# Patient Record
Sex: Male | Born: 1960 | Race: White | Hispanic: No | Marital: Married | Smoking: Former smoker
Health system: Southern US, Community
[De-identification: ages and names within clinical notes are randomized; demographics above are authoritative.]

## PROBLEM LIST (undated history)

## (undated) ENCOUNTER — Inpatient Hospital Stay: Payer: Self-pay

## (undated) DIAGNOSIS — M5030 Other cervical disc degeneration, unspecified cervical region: Secondary | ICD-10-CM

## (undated) DIAGNOSIS — I639 Cerebral infarction, unspecified: Secondary | ICD-10-CM

## (undated) DIAGNOSIS — Z860100 Personal history of colon polyps, unspecified: Secondary | ICD-10-CM

## (undated) DIAGNOSIS — Z87442 Personal history of urinary calculi: Secondary | ICD-10-CM

## (undated) DIAGNOSIS — E785 Hyperlipidemia, unspecified: Secondary | ICD-10-CM

## (undated) DIAGNOSIS — H9192 Unspecified hearing loss, left ear: Secondary | ICD-10-CM

## (undated) DIAGNOSIS — G4730 Sleep apnea, unspecified: Secondary | ICD-10-CM

## (undated) DIAGNOSIS — G589 Mononeuropathy, unspecified: Secondary | ICD-10-CM

## (undated) DIAGNOSIS — G5600 Carpal tunnel syndrome, unspecified upper limb: Secondary | ICD-10-CM

## (undated) DIAGNOSIS — K219 Gastro-esophageal reflux disease without esophagitis: Secondary | ICD-10-CM

## (undated) DIAGNOSIS — H547 Unspecified visual loss: Secondary | ICD-10-CM

## (undated) DIAGNOSIS — C61 Malignant neoplasm of prostate: Secondary | ICD-10-CM

## (undated) DIAGNOSIS — K429 Umbilical hernia without obstruction or gangrene: Secondary | ICD-10-CM

## (undated) DIAGNOSIS — G459 Transient cerebral ischemic attack, unspecified: Secondary | ICD-10-CM

## (undated) DIAGNOSIS — T7840XA Allergy, unspecified, initial encounter: Secondary | ICD-10-CM

## (undated) DIAGNOSIS — R0600 Dyspnea, unspecified: Secondary | ICD-10-CM

## (undated) DIAGNOSIS — R609 Edema, unspecified: Secondary | ICD-10-CM

## (undated) DIAGNOSIS — C801 Malignant (primary) neoplasm, unspecified: Secondary | ICD-10-CM

## (undated) HISTORY — DX: Gastro-esophageal reflux disease without esophagitis: K21.9

## (undated) HISTORY — DX: Malignant (primary) neoplasm, unspecified: C80.1

## (undated) HISTORY — DX: Hyperlipidemia, unspecified: E78.5

## (undated) HISTORY — DX: Carpal tunnel syndrome, unspecified upper limb: G56.00

## (undated) HISTORY — DX: Personal history of colonic polyps: Z86.010

## (undated) HISTORY — PX: OTHER SURGICAL HISTORY: SHX169

## (undated) HISTORY — DX: Other cervical disc degeneration, unspecified cervical region: M50.30

## (undated) HISTORY — DX: Malignant neoplasm of prostate: C61

## (undated) HISTORY — DX: Mononeuropathy, unspecified: G58.9

## (undated) HISTORY — DX: Allergy, unspecified, initial encounter: T78.40XA

## (undated) HISTORY — DX: Unspecified visual loss: H54.7

## (undated) HISTORY — PX: TONSILLECTOMY: SUR1361

## (undated) HISTORY — DX: Transient cerebral ischemic attack, unspecified: G45.9

## (undated) HISTORY — DX: Personal history of colon polyps, unspecified: Z86.0100

## (undated) HISTORY — DX: Sleep apnea, unspecified: G47.30

## (undated) HISTORY — PX: APPENDECTOMY: SHX54

## (undated) HISTORY — DX: Edema, unspecified: R60.9

---

## 2004-07-12 ENCOUNTER — Ambulatory Visit: Payer: Self-pay | Admitting: General Practice

## 2008-04-01 ENCOUNTER — Ambulatory Visit: Payer: Self-pay | Admitting: Family Medicine

## 2009-11-09 ENCOUNTER — Emergency Department: Payer: Self-pay | Admitting: Emergency Medicine

## 2011-07-25 DIAGNOSIS — G459 Transient cerebral ischemic attack, unspecified: Secondary | ICD-10-CM

## 2011-07-25 HISTORY — DX: Transient cerebral ischemic attack, unspecified: G45.9

## 2011-08-14 ENCOUNTER — Ambulatory Visit: Payer: Self-pay

## 2013-05-26 HISTORY — PX: COLONOSCOPY: SHX174

## 2014-02-17 ENCOUNTER — Other Ambulatory Visit: Payer: Self-pay | Admitting: Ophthalmology

## 2014-02-17 LAB — SEDIMENTATION RATE: Erythrocyte Sed Rate: 1 mm/hr (ref 0–20)

## 2014-04-07 ENCOUNTER — Ambulatory Visit: Payer: Self-pay | Admitting: Gastroenterology

## 2014-09-18 LAB — SURGICAL PATHOLOGY

## 2015-02-13 ENCOUNTER — Encounter: Payer: Self-pay | Admitting: Family Medicine

## 2015-02-13 ENCOUNTER — Ambulatory Visit (INDEPENDENT_AMBULATORY_CARE_PROVIDER_SITE_OTHER): Payer: BC Managed Care – PPO | Admitting: Family Medicine

## 2015-02-13 VITALS — BP 127/87 | HR 69 | Temp 98.6°F | Ht 69.5 in | Wt 280.0 lb

## 2015-02-13 DIAGNOSIS — T7840XA Allergy, unspecified, initial encounter: Secondary | ICD-10-CM | POA: Insufficient documentation

## 2015-02-13 DIAGNOSIS — G4730 Sleep apnea, unspecified: Secondary | ICD-10-CM | POA: Diagnosis not present

## 2015-02-13 DIAGNOSIS — R0981 Nasal congestion: Secondary | ICD-10-CM | POA: Insufficient documentation

## 2015-02-13 DIAGNOSIS — E669 Obesity, unspecified: Secondary | ICD-10-CM

## 2015-02-13 DIAGNOSIS — E785 Hyperlipidemia, unspecified: Secondary | ICD-10-CM

## 2015-02-13 DIAGNOSIS — Z125 Encounter for screening for malignant neoplasm of prostate: Secondary | ICD-10-CM | POA: Diagnosis not present

## 2015-02-13 DIAGNOSIS — K219 Gastro-esophageal reflux disease without esophagitis: Secondary | ICD-10-CM | POA: Diagnosis not present

## 2015-02-13 DIAGNOSIS — K429 Umbilical hernia without obstruction or gangrene: Secondary | ICD-10-CM | POA: Diagnosis not present

## 2015-02-13 MED ORDER — SIMVASTATIN 40 MG PO TABS: 40.0000 mg | ORAL_TABLET | Freq: Every day | ORAL | Status: DC

## 2015-02-13 MED ORDER — TRAZODONE HCL 50 MG PO TABS: 100.0000 mg | ORAL_TABLET | Freq: Every day | ORAL | Status: DC

## 2015-02-13 NOTE — Assessment & Plan Note (Signed)
Referral to General surgery made today. Non-strangulated.

## 2015-02-13 NOTE — Assessment & Plan Note (Addendum)
Labs checked today and lipemic, but has been off meds for 20 days.  LFTs normal. Refill of his medicine given today. Follow up in 6 months on cholesterol, 1 month for PE

## 2015-02-13 NOTE — Progress Notes (Signed)
BP 127/87 mmHg  Pulse 69  Temp(Src) 98.6 F (37 C)  Ht 5' 9.5" (1.765 m)  Wt 280 lb (127.007 kg)  BMI 40.77 kg/m2  SpO2 98%   Subjective:    Patient ID: Curtis Ran., male    DOB: 11-16-1960, 54 y.o.   MRN: [id number]  HPI: Curtis Bray. is a 54 y.o. male who presents today to establish care and for concerns about the following.   Chief Complaint  Patient presents with  . sleep issues    Patient states that he is able to fall asleep, but does not sleep soundly so he takes trazodone nightly  . Hyperlipidemia    Patient needs a refill on his cholesterol medication, he would like 90day supplies for each prescription.   HYPERLIPIDEMIA- Has known about it since TIA in 2013 Hyperlipidemia status: excellent compliance Satisfied with current treatment?  yes Side effects:  no Medication compliance: excellent compliance Supplements: fish oil Aspirin:  yes Chest pain:  no Coronary artery disease:  no Family history CAD:  no Family history early CAD:  no   Has had some bad problems with his sleep in the past. Has sleep apnea- uses the cpap. Seems to do OK, but doesn't seem to help with the sleep. Has chronic congestion in his nose that always seems to be a problem with his breathing. Not sure if he wants to see ENT or not. [facility]   INSOMNIA Duration: chronic Satisfied with sleep quality: yes Difficulty falling asleep: no Difficulty staying asleep: no Waking a few hours after sleep onset: no Early morning awakenings: no Daytime hypersomnolence: no Wakes feeling refreshed: yes Good sleep hygiene: yes Apnea: no Snoring: no Depressed/anxious mood: no Recent stress: no Restless legs/nocturnal leg cramps: no Chronic pain/arthritis: no History of sleep study: yes Treatments attempted: well controlled on trazodone  Relevant past medical, surgical, family and social history reviewed and updated as indicated. Interim medical history since our last  visit reviewed. Allergies and medications reviewed and updated.  Review of Systems  HENT: Positive for congestion, postnasal drip, rhinorrhea and sinus pressure. Negative for dental problem, drooling, ear discharge, ear pain, facial swelling, hearing loss, mouth sores, nosebleeds, sneezing, sore throat, tinnitus, trouble swallowing and voice change.   Respiratory: Positive for apnea and shortness of breath. Negative for choking, chest tightness, wheezing and stridor.   Cardiovascular: Negative.   Gastrointestinal: Negative.   Musculoskeletal: Negative.   Psychiatric/Behavioral: Negative.     Per HPI unless specifically indicated above     Objective:    BP 127/87 mmHg  Pulse 69  Temp(Src) 98.6 F (37 C)  Ht 5' 9.5" (1.765 m)  Wt 280 lb (127.007 kg)  BMI 40.77 kg/m2  SpO2 98%  Wt Readings from Last 3 Encounters:  02/13/15 280 lb (127.007 kg)    Physical Exam  Constitutional: He is oriented to person, place, and time. He appears well-developed and well-nourished. No distress.  HENT:  Head: Normocephalic and atraumatic.  Right Ear: Hearing normal.  Left Ear: Hearing normal.  Nose: Mucosal edema, sinus tenderness and septal deviation present. No rhinorrhea, nose lacerations, nasal deformity or nasal septal hematoma. No epistaxis.  No foreign bodies.  Mouth/Throat: Oropharynx is clear and moist. No oropharyngeal exudate.  Eyes: Conjunctivae and lids are normal. Right eye exhibits no discharge. Left eye exhibits no discharge. No scleral icterus.  Cardiovascular: Normal rate, regular rhythm, normal heart sounds and intact distal pulses.  Exam reveals no gallop and  no friction rub.   No murmur heard. Pulmonary/Chest: Effort normal and breath sounds normal. No respiratory distress. He has no wheezes. He has no rales. He exhibits no tenderness.  Abdominal: Soft. Bowel sounds are normal. He exhibits no distension and no mass. There is no tenderness. There is no rebound and no guarding.   Umbilical hernia   Musculoskeletal: Normal range of motion.  Neurological: He is alert and oriented to person, place, and time.  Skin: Skin is warm, dry and intact. No rash noted. No erythema. No pallor.  Psychiatric: He has a normal mood and affect. His speech is normal and behavior is normal. Judgment and thought content normal. Cognition and memory are normal.    Results for orders placed or performed in visit on 04/07/14  Surgical pathology  Result Value Ref Range   SURGICAL PATHOLOGY      Surgical Procedure CASE: [id number] PATIENT: Clide Dales Surgical Pathology Report     SPECIMEN SUBMITTED: A. Rectal polyp, cbx B. Hepatic flexure polyp, cbx  CLINICAL HISTORY: None provided  PRE-OPERATIVE DIAGNOSIS: Screening  POST-OPERATIVE DIAGNOSIS: None provided     DIAGNOSIS: A RECTAL POLYP; COLD BIOPSY: - HYPERPLASTIC POLYP. - NEGATIVE FOR DYSPLASIA AND MALIGNANCY.  B. HEPATIC FLEXURE POLYP; COLD BIOPSY: - POLYPOID FRAGMENT OF MILDLY INFLAMED COLONIC MUCOSA WITH PROMINENT LYMPHOID AGGREGATE. - NEGATIVE FOR DYSPLASIA AND MALIGNANCY.   GROSS DESCRIPTION:  A. Labeled: Rectal polyp C biopsy Tissue Fragment(s): 1 Measurement: 0.3 cm Comment: Tan  Entirely submitted in cassette(s): 1  B. Labeled: Hepatic flexure polyp C biopsy Tissue Fragment(s): 2 Measurement: 0.1-0.4 cm Comment: Tan  Entirely submitted in cassette(s): 1     Final Diagnosis performed by Quay Burow, MD.  Electronically signed 04/12/2014 6:45:22AM    The  electronic signature indicates that the named Attending Pathologist has evaluated the specimen  Technical component performed at [facility], [address] [place], [place] [address] Lab: [phone] Dir: Darrick Penna. Evette Doffing, MD  Professional component performed at [facility], [facility], [place], [place], [place] [address] Lab: [phone] Dir: Dellia Nims. Reuel Derby, MD        Assessment & Plan:    Problem List Items Addressed This Visit      Digestive   GERD (gastroesophageal reflux disease)    Well controlled on current regimen. Continue current regimen.       Relevant Medications   esomeprazole (NEXIUM) 40 MG capsule     Other   Sleep apnea    Under good control on Bipap. On trazodone for sleep with good control. Will get 3 month supply when records come in. Continue to monitor.       Hyperlipidemia - Primary    Labs checked today and lipemic, but has been off meds for 20 days.  LFTs normal. Refill of his medicine given today. Follow up in 6 months on cholesterol, 1 month for PE      Relevant Medications   simvastatin (ZOCOR) 40 MG tablet   Other Relevant Orders   CBC With Differential/Platelet   Comprehensive metabolic panel   LP+ALT+AST Piccolo, Waived   Umbilical hernia without obstruction and without gangrene    Referral to General surgery made today. Non-strangulated.       Relevant Orders   Ambulatory referral to General Surgery   CBC With Differential/Platelet   Chronic nasal congestion    Advised him against using afrin due to rebound. Will refer to ENT for eval for chronic congestion and severely deviated septum due to trauma.  Relevant Orders   Ambulatory referral to ENT   CBC With Differential/Platelet    Other Visit Diagnoses    Obesity        Work on diet and exercise. Continue to monitor.     Relevant Orders    CBC With Differential/Platelet    TSH    Screening for prostate cancer        PSA checked today    Relevant Orders    CBC With Differential/Platelet    PSA        Follow up plan: Return in about 4 weeks (around 03/13/2015) for Physical. Needs Records release.

## 2015-02-13 NOTE — Assessment & Plan Note (Addendum)
Well controlled on current regimen. Continue current regimen.

## 2015-02-13 NOTE — Assessment & Plan Note (Signed)
Advised him against using afrin due to rebound. Will refer to ENT for eval for chronic congestion and severely deviated septum due to trauma.

## 2015-02-13 NOTE — Assessment & Plan Note (Signed)
Under good control on Bipap. On trazodone for sleep with good control. Will get 3 month supply when records come in. Continue to monitor.

## 2015-02-13 NOTE — Patient Instructions (Signed)

## 2015-02-14 LAB — LP+ALT+AST PICCOLO, WAIVED
ALT (SGPT) PICCOLO, WAIVED: 42 U/L (ref 10–47)
AST (SGOT) PICCOLO, WAIVED: 25 U/L (ref 11–38)

## 2015-02-14 LAB — CBC WITH DIFFERENTIAL/PLATELET
HEMATOCRIT: 47.7 % (ref 37.5–51.0)
HEMOGLOBIN: 16.5 g/dL (ref 12.6–17.7)
LYMPHS ABS: 2.5 10*3/uL (ref 0.7–3.1)
Lymphs: 41 %
MCH: 30.7 pg (ref 26.6–33.0)
MCHC: 34.6 g/dL (ref 31.5–35.7)
MCV: 89 fL (ref 79–97)
MID (Absolute): 0.3 10*3/uL (ref 0.1–1.6)
MID: 6 %
NEUTROS PCT: 54 %
Neutrophils Absolute: 3.4 10*3/uL (ref 1.4–7.0)
Platelets: 183 10*3/uL (ref 150–379)
RBC: 5.38 x10E6/uL (ref 4.14–5.80)
RDW: 13.6 % (ref 12.3–15.4)
WBC: 6.2 10*3/uL (ref 3.4–10.8)

## 2015-02-15 ENCOUNTER — Encounter: Payer: Self-pay | Admitting: Family Medicine

## 2015-02-15 LAB — SPECIMEN STATUS REPORT

## 2015-02-15 LAB — LIPID PANEL W/O CHOL/HDL RATIO
CHOLESTEROL TOTAL: 171 mg/dL (ref 100–199)
HDL: 31 mg/dL — AB (ref >39)
LDL Calculated: 67 mg/dL (ref 0–99)
TRIGLYCERIDES: 363 mg/dL — AB (ref 0–149)

## 2015-02-16 ENCOUNTER — Ambulatory Visit: Payer: Self-pay | Admitting: Family Medicine

## 2015-02-21 ENCOUNTER — Encounter: Payer: Self-pay | Admitting: *Deleted

## 2015-02-27 LAB — COMPREHENSIVE METABOLIC PANEL
A/G RATIO: 1.9 (ref 1.1–2.5)
ALK PHOS: 65 IU/L (ref 39–117)
ALT: 34 IU/L (ref 0–44)
AST: 18 IU/L (ref 0–40)
Albumin: 4.3 g/dL (ref 3.5–5.5)
BILIRUBIN TOTAL: 0.6 mg/dL (ref 0.0–1.2)
BUN/Creatinine Ratio: 9 (ref 9–20)
BUN: 10 mg/dL (ref 6–24)
CHLORIDE: 102 mmol/L (ref 97–108)
CO2: 25 mmol/L (ref 18–29)
Calcium: 9.3 mg/dL (ref 8.7–10.2)
Creatinine, Ser: 1.07 mg/dL (ref 0.76–1.27)
GFR calc Af Amer: 91 mL/min/{1.73_m2} (ref >59)
GFR calc non Af Amer: 79 mL/min/{1.73_m2} (ref >59)
GLOBULIN, TOTAL: 2.3 g/dL (ref 1.5–4.5)
Glucose: 94 mg/dL (ref 65–99)
POTASSIUM: 4.6 mmol/L (ref 3.5–5.2)
SODIUM: 142 mmol/L (ref 134–144)
Total Protein: 6.6 g/dL (ref 6.0–8.5)

## 2015-02-27 LAB — TSH: TSH: 2.67 u[IU]/mL (ref 0.450–4.500)

## 2015-02-27 LAB — PSA: PROSTATE SPECIFIC AG, SERUM: 3.6 ng/mL (ref 0.0–4.0)

## 2015-03-07 ENCOUNTER — Ambulatory Visit (INDEPENDENT_AMBULATORY_CARE_PROVIDER_SITE_OTHER): Payer: BC Managed Care – PPO | Admitting: General Surgery

## 2015-03-07 ENCOUNTER — Encounter: Payer: Self-pay | Admitting: General Surgery

## 2015-03-07 VITALS — BP 120/70 | HR 78 | Resp 12 | Ht 70.0 in | Wt 283.0 lb

## 2015-03-07 DIAGNOSIS — K429 Umbilical hernia without obstruction or gangrene: Secondary | ICD-10-CM

## 2015-03-07 DIAGNOSIS — R202 Paresthesia of skin: Secondary | ICD-10-CM | POA: Insufficient documentation

## 2015-03-07 DIAGNOSIS — R200 Anesthesia of skin: Secondary | ICD-10-CM | POA: Insufficient documentation

## 2015-03-07 NOTE — Progress Notes (Signed)
Patient ID: Curtis Ran., male   DOB: 1960-08-14, 54 y.o.   MRN: [id number]  Chief Complaint  Patient presents with  . Hernia    HPI Curtis Bray. is a 54 y.o. male. Here today for evaluation of a possible umbilical hernia. He states he noticed this about nine months ago. No pain or change in size. He states he has had some numbness in his right thumb.    HPI  Past Medical History  Diagnosis Date  . TIA (transient ischemic attack) 07/2011  . Vision impairment     Blindness Right Eye  . Edema   . Sleep apnea     Bi-pap  . Hyperlipidemia   . GERD (gastroesophageal reflux disease)   . Allergy   . Personal history of colonic polyps   . Sleep apnea     Past Surgical History  Procedure Laterality Date  . Appendectomy    . Colonoscopy  2015    Found 2 beign polyps, repeat 10 years  . Carpel tunnel Bilateral   . Tonsillectomy      Family History  Problem Relation Age of Onset  . Hearing loss Father   . Hypertension Father   . Asthma Son   . Cancer Maternal Grandmother     Colon    Social History Social History  Substance Use Topics  . Smoking status: Former Smoker -- 18 years    Quit date: 05/27/1995  . Smokeless tobacco: Never Used  . Alcohol Use: 0.0 oz/week    0 Standard drinks or equivalent per week     Comment: Rare    No Known Allergies  Current Outpatient Prescriptions  Medication Sig Dispense Refill  . aspirin 81 MG tablet Take 81 mg by mouth daily.    Marland Kitchen BIOTIN PO Take 1 tablet by mouth daily.    . Coenzyme Q10 (CO Q 10) 10 MG CAPS Take by mouth.    . esomeprazole (NEXIUM) 40 MG capsule Take by mouth.    . Misc. Devices (NASAL SPRAY BOTTLE) MISC Place 1 spray into the nose daily.    . simvastatin (ZOCOR) 40 MG tablet Take 1 tablet (40 mg total) by mouth daily at 6 PM. 90 tablet 1  . traZODone (DESYREL) 50 MG tablet Take 2 tablets (100 mg total) by mouth at bedtime. 60 tablet 1   No current facility-administered medications for this visit.     Review of Systems Review of Systems  Constitutional: Negative.   Respiratory: Negative.   Cardiovascular: Negative.   Gastrointestinal: Negative.     Blood pressure 120/70, pulse 78, resp. rate 12, height 5\' 10"  (1.778 m), weight 283 lb (128.368 kg).  Physical Exam Physical Exam  Constitutional: He is oriented to person, place, and time. He appears well-developed and well-nourished.  Cardiovascular: Normal rate, regular rhythm and normal heart sounds.   Pulmonary/Chest: Effort normal and breath sounds normal.  Abdominal: Soft. Normal appearance and bowel sounds are normal. There is no hepatomegaly. There is no tenderness. A hernia (small umbilical hernia 6 mm defect  reducible) is present.    Musculoskeletal:       Arms: Neurological: He is alert and oriented to person, place, and time. He has normal strength. No cranial nerve deficit.  Upper extremity motor strength testing 5/5+ bilaterally.  Reported numbness on the dorsum of the right thumb from the tip of the gland to the MCP joint. No signs of trauma. Mildly tender with palpation.  Skin: Skin is warm  and dry.    Data Reviewed None  Assessment    Umbilical hernia, minimally symptomatic.  Numbness involving the right dorsal thumb.    Plan    Options for management of the hernia were reviewed: 1) observation versus 2) elective repair.  Patient is minimally symptomatic at the present time. Risks and benefits of elective repair were discussed. Unlikely candidate for mesh reinforcement. She was redeveloped pain at the umbilical hernia site or notices significant change in size early follow-up was recommended.   Patient to return as needed.   PCP:  Thelma Comp 03/07/2015, 9:29 PM

## 2015-03-07 NOTE — Patient Instructions (Signed)
Patient to return as needed. 

## 2015-03-09 ENCOUNTER — Telehealth: Payer: Self-pay | Admitting: *Deleted

## 2015-03-09 NOTE — Telephone Encounter (Signed)
-----   Message from Robert Bellow, MD sent at 03/09/2015  7:46 AM EDT ----- Please notifythe patient I spoke w/ ortho about his thumb numbness. If he wears his watch on that wrist, he should move it to the other side and see if the numbness resolves. If he does not wear his watch on the right wrist, he should be seen by Dr.Chris Tamala Julian or Earnestine Leys at [facility] for assessment.

## 2015-03-09 NOTE — Telephone Encounter (Signed)
Notified patient as instructed, patient pleased. Discussed follow-up appointments as needed, patient agrees. He states he will follow up with [facility].

## 2015-03-14 ENCOUNTER — Ambulatory Visit
Admission: RE | Admit: 2015-03-14 | Discharge: 2015-03-14 | Disposition: A | Discharge status: Home / Self Care | Payer: BC Managed Care – PPO | Source: Ambulatory Visit | Attending: Family Medicine | Admitting: Family Medicine

## 2015-03-14 ENCOUNTER — Telehealth: Payer: Self-pay | Admitting: Family Medicine

## 2015-03-14 ENCOUNTER — Encounter: Payer: Self-pay | Admitting: Family Medicine

## 2015-03-14 ENCOUNTER — Ambulatory Visit (INDEPENDENT_AMBULATORY_CARE_PROVIDER_SITE_OTHER): Payer: BC Managed Care – PPO | Admitting: Family Medicine

## 2015-03-14 VITALS — BP 127/81 | HR 75 | Temp 98.2°F | Ht 70.2 in | Wt 281.0 lb

## 2015-03-14 DIAGNOSIS — R202 Paresthesia of skin: Secondary | ICD-10-CM

## 2015-03-14 DIAGNOSIS — Z0000 Encounter for general adult medical examination without abnormal findings: Secondary | ICD-10-CM | POA: Diagnosis not present

## 2015-03-14 DIAGNOSIS — M25552 Pain in left hip: Secondary | ICD-10-CM

## 2015-03-14 DIAGNOSIS — R0981 Nasal congestion: Secondary | ICD-10-CM | POA: Diagnosis not present

## 2015-03-14 DIAGNOSIS — R200 Anesthesia of skin: Secondary | ICD-10-CM

## 2015-03-14 MED ORDER — PREDNISONE 10 MG PO TABS: ORAL_TABLET | ORAL | Status: DC

## 2015-03-14 NOTE — Patient Instructions (Signed)

## 2015-03-14 NOTE — Assessment & Plan Note (Signed)
Encouraged patient to make that appointment with ENT.

## 2015-03-14 NOTE — Telephone Encounter (Signed)
Called with negative x-ray results. Will try prednisone. Sent to his pharmacy. Will check in in 1 week to see if better, if not better, consider neurology referral. Will hold on orthopedic referral at this time.

## 2015-03-14 NOTE — Progress Notes (Signed)
BP 127/81 mmHg  Pulse 75  Temp(Src) 98.2 F (36.8 C)  Ht 5' 10.2" (1.783 m)  Wt 281 lb (127.461 kg)  BMI 40.09 kg/m2  SpO2 97%   Subjective:    Patient ID: Curtis Ran., male    DOB: [birth year], 54 y.o.   MRN: [id number]  HPI: Curtis Duford. is a 54 y.o. male presenting on 03/14/2015 for comprehensive medical examination. Current medical complaints include: thumb numbness  Thumb numbness- general surgeon gave him a referral to ortho, but hasn't seen them yet Duration: 5 weeks Involved hand: right Mechanism of injury: Lost nail back in March, but no trauma since then Location: lateral dorsum Onset: sudden Severity: severe  Quality: numb Frequency: constant Radiation: yes up into his palm a little bit Aggravating factors: nothing Alleviating factors: nothing Treatments attempted: nothing Relief with NSAIDs?: No NSAIDs Taken Weakness: no Numbness: yes Redness: no Swelling:no Bruising: no Fevers: no  Interim Problems from his last visit: see above  Depression Screen done today and results listed below:  Depression screen [facility] 2/9 03/14/2015  Decreased Interest 0  Down, Depressed, Hopeless 1  PHQ - 2 Score 1  Altered sleeping 0  Tired, decreased energy 0  Change in appetite 1  Feeling bad or failure about yourself  0  Trouble concentrating 0  Moving slowly or fidgety/restless 0  Suicidal thoughts 0  PHQ-9 Score 2  Difficult doing work/chores Not difficult at all    The patient does not have a history of falls. I did not complete a risk assessment for falls. A plan of care for falls was not documented.  Past Medical History:  Past Medical History  Diagnosis Date  . TIA (transient ischemic attack) 07/2011  . Vision impairment     Blindness Right Eye  . Edema   . Sleep apnea     Bi-pap  . Hyperlipidemia   . GERD (gastroesophageal reflux disease)   . Allergy   . Personal history of colonic polyps   . Sleep apnea     Surgical History:  Past  Surgical History  Procedure Laterality Date  . Appendectomy    . Colonoscopy  2015    Found 2 beign polyps, repeat 10 years  . Carpel tunnel Bilateral   . Tonsillectomy      Medications:  Current Outpatient Prescriptions on File Prior to Visit  Medication Sig  . aspirin 81 MG tablet Take 81 mg by mouth daily.  Marland Kitchen BIOTIN PO Take 1 tablet by mouth daily.  . Coenzyme Q10 (CO Q 10) 10 MG CAPS Take by mouth.  . esomeprazole (NEXIUM) 40 MG capsule Take by mouth.  . Misc. Devices (NASAL SPRAY BOTTLE) MISC Place 1 spray into the nose daily.  . simvastatin (ZOCOR) 40 MG tablet Take 1 tablet (40 mg total) by mouth daily at 6 PM.  . traZODone (DESYREL) 50 MG tablet Take 2 tablets (100 mg total) by mouth at bedtime.   No current facility-administered medications on file prior to visit.    Allergies:  No Known Allergies  Social History:  Social History   Social History  . Marital Status: Married    Spouse Name: N/A  . Number of Children: N/A  . Years of Education: N/A   Occupational History  . Not on file.   Social History Main Topics  . Smoking status: Former Smoker -- 18 years    Quit date: 05/27/1995  . Smokeless tobacco: Never Used  . Alcohol Use: 0.0 oz/week  0 Standard drinks or equivalent per week     Comment: Rare  . Drug Use: No  . Sexual Activity: Yes   Other Topics Concern  . Not on file   Social History Narrative   History  Smoking status  . Former Smoker -- 18 years  . Quit date: 05/27/1995  Smokeless tobacco  . Never Used   History  Alcohol Use  . 0.0 oz/week  . 0 Standard drinks or equivalent per week    Comment: Rare    Family History:  Family History  Problem Relation Age of Onset  . Hearing loss Father   . Hypertension Father   . Asthma Son   . Cancer Maternal Grandmother     Colon    Past medical history, surgical history, medications, allergies, family history and social history reviewed with patient today and changes made to  appropriate areas of the chart.   Review of Systems  Constitutional: Negative.   HENT: Positive for congestion. Negative for ear discharge, ear pain, hearing loss, nosebleeds, sore throat and tinnitus.   Eyes: Negative.   Respiratory: Negative.  Negative for stridor.        DOE   Cardiovascular: Negative.   Gastrointestinal: Negative.   Genitourinary: Negative.   Musculoskeletal: Negative for myalgias, back pain, joint pain, falls and neck pain.       L hip feels tight with decreased ROM   Skin: Negative.   Neurological: Positive for tingling. Negative for tremors, sensory change, speech change, focal weakness, seizures, loss of consciousness and headaches.  Endo/Heme/Allergies: Negative.   Psychiatric/Behavioral: Negative.     All other ROS negative except what is listed above and in the HPI.      Objective:    BP 127/81 mmHg  Pulse 75  Temp(Src) 98.2 F (36.8 C)  Ht 5' 10.2" (1.783 m)  Wt 281 lb (127.461 kg)  BMI 40.09 kg/m2  SpO2 97%  Wt Readings from Last 3 Encounters:  03/14/15 281 lb (127.461 kg)  03/07/15 283 lb (128.368 kg)  02/13/15 280 lb (127.007 kg)    Physical Exam  Constitutional: He is oriented to person, place, and time. He appears well-developed and well-nourished. No distress.  HENT:  Head: Normocephalic and atraumatic.  Right Ear: Hearing and external ear normal.  Left Ear: Hearing and external ear normal.  Nose: Mucosal edema and septal deviation present. No rhinorrhea, nose lacerations, sinus tenderness, nasal deformity or nasal septal hematoma. No epistaxis.  No foreign bodies. Right sinus exhibits no maxillary sinus tenderness and no frontal sinus tenderness. Left sinus exhibits no maxillary sinus tenderness and no frontal sinus tenderness.  Mouth/Throat: Oropharynx is clear and moist. No oropharyngeal exudate.  Eyes: Conjunctivae, EOM and lids are normal. Pupils are equal, round, and reactive to light. Right eye exhibits no discharge. Left eye  exhibits no discharge. No scleral icterus.  Neck: Normal range of motion. Neck supple. No JVD present. No tracheal deviation present. No thyromegaly present.  Cardiovascular: Normal rate, regular rhythm, normal heart sounds and intact distal pulses.  Exam reveals no gallop and no friction rub.   No murmur heard. Pulmonary/Chest: Effort normal and breath sounds normal. No stridor. No respiratory distress.  Abdominal: Soft. Bowel sounds are normal. He exhibits no distension and no mass. There is no tenderness. There is no rebound and no guarding.  Genitourinary: Rectum normal, prostate normal and penis normal. No penile tenderness.  Musculoskeletal: Normal range of motion. He exhibits no edema or tenderness.  No tenderness  to palpation of the thumb  Lymphadenopathy:    He has no cervical adenopathy.  Neurological: He is alert and oriented to person, place, and time. He has normal reflexes. He displays normal reflexes. No cranial nerve deficit. He exhibits normal muscle tone. Coordination normal.  Negative Phalens, negative tinel's, decreased sensation to lateral half of R thumb  Skin: Skin is warm, dry and intact. No rash noted. He is not diaphoretic. No erythema. No pallor.  Psychiatric: He has a normal mood and affect. His speech is normal and behavior is normal. Judgment and thought content normal. Cognition and memory are normal.  Nursing note and vitals reviewed.   Results for orders placed or performed in visit on 02/13/15  Comprehensive metabolic panel  Result Value Ref Range   Glucose 94 65 - 99 mg/dL   BUN 10 6 - 24 mg/dL   Creatinine, Ser 1.07 0.76 - 1.27 mg/dL   GFR calc non Af Amer 79 >59 mL/min/1.73   GFR calc Af Amer 91 >59 mL/min/1.73   BUN/Creatinine Ratio 9 9 - 20   Sodium 142 134 - 144 mmol/L   Potassium 4.6 3.5 - 5.2 mmol/L   Chloride 102 97 - 108 mmol/L   CO2 25 18 - 29 mmol/L   Calcium 9.3 8.7 - 10.2 mg/dL   Total Protein 6.6 6.0 - 8.5 g/dL   Albumin 4.3 3.5 - 5.5  g/dL   Globulin, Total 2.3 1.5 - 4.5 g/dL   Albumin/Globulin Ratio 1.9 1.1 - 2.5   Bilirubin Total 0.6 0.0 - 1.2 mg/dL   Alkaline Phosphatase 65 39 - 117 IU/L   AST 18 0 - 40 IU/L   ALT 34 0 - 44 IU/L  PSA  Result Value Ref Range   Prostate Specific Ag, Serum 3.6 0.0 - 4.0 ng/mL  TSH  Result Value Ref Range   TSH 2.670 0.450 - 4.500 uIU/mL  LP+ALT+AST Piccolo, Waived  Result Value Ref Range   ALT (SGPT) Piccolo, Waived 42 10 - 47 U/L   AST (SGOT) Piccolo, Waived 25 11 - 38 U/L   Cholesterol Piccolo, Waived CANCELED    HDL Chol Piccolo, Waived CANCELED    Triglycerides Piccolo,Waived CANCELED   CBC With Differential/Platelet  Result Value Ref Range   WBC 6.2 3.4 - 10.8 x10E3/uL   RBC 5.38 4.14 - 5.80 x10E6/uL   Hemoglobin 16.5 12.6 - 17.7 g/dL   Hematocrit 47.7 37.5 - 51.0 %   MCV 89 79 - 97 fL   MCH 30.7 26.6 - 33.0 pg   MCHC 34.6 31.5 - 35.7 g/dL   RDW 13.6 12.3 - 15.4 %   Platelets 183 150 - 379 x10E3/uL   Neutrophils 54 %   Lymphs 41 %   MID 6 %   Neutrophils Absolute 3.4 1.4 - 7.0 x10E3/uL   Lymphocytes Absolute 2.5 0.7 - 3.1 x10E3/uL   MID (Absolute) 0.3 0.1 - 1.6 X10E3/uL  Specimen status report  Result Value Ref Range   specimen status report Comment   Lipid Panel w/o Chol/HDL Ratio  Result Value Ref Range   Cholesterol, Total 171 100 - 199 mg/dL   Triglycerides 363 (H) 0 - 149 mg/dL   HDL 31 (L) >39 mg/dL   LDL Calculated 67 0 - 99 mg/dL      Assessment & Plan:   Problem List Items Addressed This Visit    None    Visit Diagnoses    Paresthesia of thumb of right hand    -  Primary    Relevant Orders    DG Finger Thumb Right       LABORATORY TESTING:  Health maintenance labs ordered today as discussed above.   The natural history of prostate cancer and ongoing controversy regarding screening and potential treatment outcomes of prostate cancer has been discussed with the patient. The meaning of a false positive PSA and a false negative PSA has been  discussed. He indicates understanding of the limitations of this screening test and wishes to proceed with screening PSA testing. It was done last visit and was normal.    IMMUNIZATIONS:   - Tdap: Tetanus vaccination status reviewed: Refused. - Influenza: Up to date  SCREENING: - Colonoscopy: Up to date  Discussed with patient purpose of the colonoscopy is to detect colon cancer at curable precancerous or early stages   PATIENT COUNSELING:    Sexuality: Discussed sexually transmitted diseases, partner selection, use of condoms, avoidance of unintended pregnancy  and contraceptive alternatives.   Advised to avoid cigarette smoking.  I discussed with the patient that most people either abstain from alcohol or drink within safe limits (<=14/week and <=4 drinks/occasion for males, <=7/weeks and <= 3 drinks/occasion for females) and that the risk for alcohol disorders and other health effects rises proportionally with the number of drinks per week and how often a drinker exceeds daily limits.  Discussed cessation/primary prevention of drug use and availability of treatment for abuse.   Diet: Encouraged to adjust caloric intake to maintain  or achieve ideal body weight, to reduce intake of dietary saturated fat and total fat, to limit sodium intake by avoiding high sodium foods and not adding table salt, and to maintain adequate dietary potassium and calcium preferably from fresh fruits, vegetables, and low-fat dairy products.    stressed the importance of regular exercise  Injury prevention: Discussed safety belts, safety helmets, smoke detector, smoking near bedding or upholstery.   Dental health: Discussed importance of regular tooth brushing, flossing, and dental visits.   Follow up plan: NEXT PREVENTATIVE PHYSICAL DUE IN 1 YEAR. Return in about 6 months (around 09/12/2015) for Cholesterol follow up.

## 2015-03-14 NOTE — Assessment & Plan Note (Signed)
Normal exam, but given trauma that happened in March, will obtain x-ray of the thumb. Await results. Has referral to ortho, but has not called them yet. Offered steroid for anti-inflammatory, but wants to hold off at this time.

## 2015-03-22 ENCOUNTER — Telehealth: Payer: Self-pay | Admitting: Family Medicine

## 2015-03-22 DIAGNOSIS — R200 Anesthesia of skin: Secondary | ICD-10-CM

## 2015-03-22 DIAGNOSIS — R202 Paresthesia of skin: Principal | ICD-10-CM

## 2015-03-22 NOTE — Telephone Encounter (Signed)
Calling to check in on his thumb numbness. LMOM for him to call back.

## 2015-03-26 NOTE — Telephone Encounter (Signed)
Called to ask how his thumb is doing. Still very numb, possibly a little worse. No change with the prednisone. He would like to look into this further. Would like to see neurology. Referral to neurology generated today. Would like to stay local.

## 2015-05-11 ENCOUNTER — Other Ambulatory Visit: Payer: Self-pay | Admitting: Family Medicine

## 2015-05-11 ENCOUNTER — Telehealth: Payer: Self-pay | Admitting: Family Medicine

## 2015-05-11 MED ORDER — SIMVASTATIN 40 MG PO TABS: 40.0000 mg | ORAL_TABLET | Freq: Every day | ORAL | Status: DC

## 2015-05-11 MED ORDER — ESOMEPRAZOLE MAGNESIUM 40 MG PO CPDR: 40.0000 mg | DELAYED_RELEASE_CAPSULE | Freq: Every day | ORAL | Status: DC

## 2015-05-11 MED ORDER — ASPIRIN 81 MG PO TABS: 81.0000 mg | ORAL_TABLET | Freq: Every day | ORAL | Status: DC

## 2015-05-11 MED ORDER — TRAZODONE HCL 50 MG PO TABS: 100.0000 mg | ORAL_TABLET | Freq: Every day | ORAL | Status: DC

## 2015-05-11 NOTE — Telephone Encounter (Signed)
Pt called needs 90 day supplies of the following:  Aspirin Trazadone Simvastatin Nexium  Needs new RX's printed as he is changing pharmacies. Please call when RX's are ready for pick up @ [phone]. Thanks.

## 2015-05-11 NOTE — Telephone Encounter (Signed)
90 day supply refill for medication. Patient states he needs rx's printed because he's changing pharmacies.    Last Visit: 03/14/2015 Next Appt: 09/13/2015

## 2015-05-16 ENCOUNTER — Telehealth: Payer: Self-pay

## 2015-05-16 ENCOUNTER — Other Ambulatory Visit: Payer: Self-pay | Admitting: Family Medicine

## 2015-05-16 MED ORDER — ESOMEPRAZOLE MAGNESIUM 20 MG PO CPDR: 20.0000 mg | DELAYED_RELEASE_CAPSULE | Freq: Every day | ORAL | Status: DC

## 2015-05-16 NOTE — Telephone Encounter (Signed)
Patient called and said he needs a prior authroization for his Nexium. I explained to call his pharmacy and the pharmacy will fax me over a form. The form has information that I use to complete the PA. He asked how long it took and I explained that it depended on [employer], but 2-4 days usually. He asked "so through Christmas?" and I explained "yes".   I asked Dr. Sanda Klein if we could give him samples of Nexium to get him through the weekend and determination of the PA. She agreed and we gave him 4 packs of Nexium samples. I put the samples in a bag and wrote Curtis Bray a little note. This is what the note read.   --- "Dear Mr. Curtis Bray,  Here are 4packs of Nexium samples to get you through till the determination of your Prior Authorization. Take 1 capsule daily.  To begin the process, call your pharmacy and have them fax [place] a Prior Authorization Request form. It has information included I use for a better chance of approval.  If you have any questions or concerns, please don't hesitate to call.   Thank you and Leida Lauth, CMA" ---  Patient notified via phone to come pick up his samples and to take 1 daily.

## 2015-05-16 NOTE — Telephone Encounter (Signed)
Sept 20, 2016 SGPT reviewed; Rx approved

## 2015-06-07 NOTE — Telephone Encounter (Signed)
Disregard

## 2015-06-08 DIAGNOSIS — IMO0002 Reserved for concepts with insufficient information to code with codable children: Secondary | ICD-10-CM | POA: Insufficient documentation

## 2015-08-13 ENCOUNTER — Other Ambulatory Visit: Payer: Self-pay | Admitting: Family Medicine

## 2015-08-15 ENCOUNTER — Other Ambulatory Visit: Payer: Self-pay

## 2015-08-15 MED ORDER — TRAZODONE HCL 50 MG PO TABS: 100.0000 mg | ORAL_TABLET | Freq: Every day | ORAL | Status: DC

## 2015-08-15 MED ORDER — ESOMEPRAZOLE MAGNESIUM 40 MG PO CPDR: 40.0000 mg | DELAYED_RELEASE_CAPSULE | Freq: Every day | ORAL | Status: DC

## 2015-09-13 ENCOUNTER — Other Ambulatory Visit: Payer: Self-pay | Admitting: Family Medicine

## 2015-09-13 ENCOUNTER — Ambulatory Visit (INDEPENDENT_AMBULATORY_CARE_PROVIDER_SITE_OTHER): Payer: BC Managed Care – PPO | Admitting: Family Medicine

## 2015-09-13 ENCOUNTER — Encounter: Payer: Self-pay | Admitting: Family Medicine

## 2015-09-13 VITALS — BP 126/77 | HR 65 | Temp 98.0°F | Ht 69.7 in | Wt 282.0 lb

## 2015-09-13 DIAGNOSIS — E785 Hyperlipidemia, unspecified: Secondary | ICD-10-CM | POA: Diagnosis not present

## 2015-09-13 DIAGNOSIS — R0981 Nasal congestion: Secondary | ICD-10-CM | POA: Diagnosis not present

## 2015-09-13 DIAGNOSIS — K219 Gastro-esophageal reflux disease without esophagitis: Secondary | ICD-10-CM | POA: Diagnosis not present

## 2015-09-13 DIAGNOSIS — G4700 Insomnia, unspecified: Secondary | ICD-10-CM | POA: Diagnosis not present

## 2015-09-13 LAB — LP+ALT+AST PICCOLO, WAIVED
ALT (SGPT) Piccolo, Waived: 38 U/L (ref 10–47)
AST (SGOT) Piccolo, Waived: 26 U/L (ref 11–38)

## 2015-09-13 MED ORDER — SIMVASTATIN 40 MG PO TABS: 40.0000 mg | ORAL_TABLET | Freq: Every day | ORAL | Status: DC

## 2015-09-13 MED ORDER — TRAZODONE HCL 50 MG PO TABS: 100.0000 mg | ORAL_TABLET | Freq: Every day | ORAL | Status: DC

## 2015-09-13 MED ORDER — RABEPRAZOLE SODIUM 20 MG PO TBEC: 20.0000 mg | DELAYED_RELEASE_TABLET | Freq: Every day | ORAL | Status: DC

## 2015-09-13 NOTE — Assessment & Plan Note (Signed)
Well controlled on his trazodone. Continue current regimen. Advised him of drug holidays. Call with any concerns.

## 2015-09-13 NOTE — Assessment & Plan Note (Signed)
Will restart aciphex. Call if not helping or causing problems.

## 2015-09-13 NOTE — Assessment & Plan Note (Signed)
Lipemic on exam today. Will send out. Await results.

## 2015-09-13 NOTE — Assessment & Plan Note (Signed)
Did not see ENT. Last referral >70months old. New referral put in today.

## 2015-09-13 NOTE — Progress Notes (Signed)
BP 126/77 mmHg  Pulse 65  Temp(Src) 98 F (36.7 C)  Ht 5' 9.7" (1.77 m)  Wt 282 lb (127.914 kg)  BMI 40.83 kg/m2  SpO2 98%   Subjective:    Patient ID: Curtis Ran., male    DOB: [birth year], 55 y.o.   MRN: [id number]  HPI: Curtis Achee. is a 55 y.o. male  Chief Complaint  Patient presents with  . Hyperlipidemia  . Gastroesophageal Reflux   HYPERLIPIDEMIA Hyperlipidemia status: good control Satisfied with current treatment?  yes Side effects:  no Medication compliance: excellent compliance Past cholesterol meds: none Supplements: none Aspirin:  yes Chest pain:  yes Coronary artery disease:  no Family history CAD:  yes Family history early CAD:  no  GERD- couldn't afford the nexium GERD control status: uncontrolled  Satisfied with current treatment? no Heartburn frequency: Constant Medication side effects: no  Medication compliance: can't afford it Previous GERD medications: nexium, omeprazole,aciphex Antacid use frequency:  daily Duration: chronic Nature: burning Location: substernal Heartburn duration: chronic and all the time Alleviatiating factors:  medicine Aggravating factors: food Dysphagia: no Odynophagia:  no Hematemesis: no Blood in stool: no EGD: no  INSOMNIA Duration: chronic Satisfied with sleep quality: yes Difficulty falling asleep: no Difficulty staying asleep: no Waking a few hours after sleep onset: no Early morning awakenings: no Daytime hypersomnolence: no Wakes feeling refreshed: yes Good sleep hygiene: yes Apnea: yes Snoring: yes Depressed/anxious mood: no Recent stress: no Restless legs/nocturnal leg cramps: no Chronic pain/arthritis: no History of sleep study: yes Treatments attempted: does really well with trazodone, would like to continue it   Still having a lot of issues with congestion. Did not see ENT. Needs new referral.  Relevant past medical, surgical, family and social history reviewed and updated as  indicated. Interim medical history since our last visit reviewed. Allergies and medications reviewed and updated.  Review of Systems  Constitutional: Negative.   HENT: Positive for congestion. Negative for dental problem.   Respiratory: Negative.   Cardiovascular: Negative.   Gastrointestinal: Negative.   Psychiatric/Behavioral: Negative.     Per HPI unless specifically indicated above     Objective:    BP 126/77 mmHg  Pulse 65  Temp(Src) 98 F (36.7 C)  Ht 5' 9.7" (1.77 m)  Wt 282 lb (127.914 kg)  BMI 40.83 kg/m2  SpO2 98%  Wt Readings from Last 3 Encounters:  09/13/15 282 lb (127.914 kg)  03/14/15 281 lb (127.461 kg)  03/07/15 283 lb (128.368 kg)    Physical Exam  Constitutional: He is oriented to person, place, and time. He appears well-developed and well-nourished. No distress.  HENT:  Head: Normocephalic and atraumatic.  Right Ear: Hearing normal.  Left Ear: Hearing normal.  Nose: Nose normal.  Eyes: Conjunctivae and lids are normal. Right eye exhibits no discharge. Left eye exhibits no discharge. No scleral icterus.  Cardiovascular: Normal rate, regular rhythm, normal heart sounds and intact distal pulses.  Exam reveals no gallop and no friction rub.   No murmur heard. Pulmonary/Chest: Effort normal and breath sounds normal. No respiratory distress. He has no wheezes. He has no rales. He exhibits no tenderness.  Musculoskeletal: Normal range of motion.  Neurological: He is alert and oriented to person, place, and time.  Skin: Skin is warm, dry and intact. No rash noted. No erythema. No pallor.  Psychiatric: He has a normal mood and affect. His speech is normal and behavior is normal. Judgment and thought content normal. Cognition and memory  are normal.  Nursing note and vitals reviewed.   Results for orders placed or performed in visit on 02/13/15  Comprehensive metabolic panel  Result Value Ref Range   Glucose 94 65 - 99 mg/dL   BUN 10 6 - 24 mg/dL    Creatinine, Ser 1.07 0.76 - 1.27 mg/dL   GFR calc non Af Amer 79 >59 mL/min/1.73   GFR calc Af Amer 91 >59 mL/min/1.73   BUN/Creatinine Ratio 9 9 - 20   Sodium 142 134 - 144 mmol/L   Potassium 4.6 3.5 - 5.2 mmol/L   Chloride 102 97 - 108 mmol/L   CO2 25 18 - 29 mmol/L   Calcium 9.3 8.7 - 10.2 mg/dL   Total Protein 6.6 6.0 - 8.5 g/dL   Albumin 4.3 3.5 - 5.5 g/dL   Globulin, Total 2.3 1.5 - 4.5 g/dL   Albumin/Globulin Ratio 1.9 1.1 - 2.5   Bilirubin Total 0.6 0.0 - 1.2 mg/dL   Alkaline Phosphatase 65 39 - 117 IU/L   AST 18 0 - 40 IU/L   ALT 34 0 - 44 IU/L  PSA  Result Value Ref Range   Prostate Specific Ag, Serum 3.6 0.0 - 4.0 ng/mL  TSH  Result Value Ref Range   TSH 2.670 0.450 - 4.500 uIU/mL  LP+ALT+AST Piccolo, Waived  Result Value Ref Range   ALT (SGPT) Piccolo, Waived 42 10 - 47 U/L   AST (SGOT) Piccolo, Waived 25 11 - 38 U/L   Cholesterol Piccolo, Waived CANCELED    HDL Chol Piccolo, Waived CANCELED    Triglycerides Piccolo,Waived CANCELED   CBC With Differential/Platelet  Result Value Ref Range   WBC 6.2 3.4 - 10.8 x10E3/uL   RBC 5.38 4.14 - 5.80 x10E6/uL   Hemoglobin 16.5 12.6 - 17.7 g/dL   Hematocrit 47.7 37.5 - 51.0 %   MCV 89 79 - 97 fL   MCH 30.7 26.6 - 33.0 pg   MCHC 34.6 31.5 - 35.7 g/dL   RDW 13.6 12.3 - 15.4 %   Platelets 183 150 - 379 x10E3/uL   Neutrophils 54 %   Lymphs 41 %   MID 6 %   Neutrophils Absolute 3.4 1.4 - 7.0 x10E3/uL   Lymphocytes Absolute 2.5 0.7 - 3.1 x10E3/uL   MID (Absolute) 0.3 0.1 - 1.6 X10E3/uL  Specimen status report  Result Value Ref Range   specimen status report Comment   Lipid Panel w/o Chol/HDL Ratio  Result Value Ref Range   Cholesterol, Total 171 100 - 199 mg/dL   Triglycerides 363 (H) 0 - 149 mg/dL   HDL 31 (L) >39 mg/dL   LDL Calculated 67 0 - 99 mg/dL      Assessment & Plan:   Problem List Items Addressed This Visit      Digestive   GERD (gastroesophageal reflux disease)    Will restart aciphex. Call if not  helping or causing problems.       Relevant Medications   RABEprazole (ACIPHEX) 20 MG tablet     Other   Hyperlipidemia - Primary    Lipemic on exam today. Will send out. Await results.       Relevant Medications   simvastatin (ZOCOR) 40 MG tablet   Other Relevant Orders   Lipid Panel w/o Chol/HDL Ratio   Chronic nasal congestion    Did not see ENT. Last referral >8months old. New referral put in today.      Relevant Orders   Ambulatory referral to ENT  Insomnia    Well controlled on his trazodone. Continue current regimen. Advised him of drug holidays. Call with any concerns.           Follow up plan: Return in about 6 months (around 03/14/2016) for Physical.

## 2015-09-14 ENCOUNTER — Encounter: Payer: Self-pay | Admitting: Family Medicine

## 2015-09-14 LAB — LIPID PANEL W/O CHOL/HDL RATIO
Cholesterol, Total: 149 mg/dL (ref 100–199)
HDL: 31 mg/dL — AB (ref >39)
TRIGLYCERIDES: 466 mg/dL — AB (ref 0–149)

## 2015-12-10 ENCOUNTER — Other Ambulatory Visit: Payer: Self-pay | Admitting: Family Medicine

## 2015-12-11 ENCOUNTER — Ambulatory Visit (INDEPENDENT_AMBULATORY_CARE_PROVIDER_SITE_OTHER): Payer: BC Managed Care – PPO | Admitting: Unknown Physician Specialty

## 2015-12-11 ENCOUNTER — Encounter: Payer: Self-pay | Admitting: Unknown Physician Specialty

## 2015-12-11 VITALS — BP 128/69 | HR 79 | Temp 97.9°F | Ht 71.1 in | Wt 281.2 lb

## 2015-12-11 DIAGNOSIS — J0191 Acute recurrent sinusitis, unspecified: Secondary | ICD-10-CM | POA: Diagnosis not present

## 2015-12-11 DIAGNOSIS — H109 Unspecified conjunctivitis: Secondary | ICD-10-CM

## 2015-12-11 MED ORDER — ERYTHROMYCIN 5 MG/GM OP OINT: 1.0000 "application " | TOPICAL_OINTMENT | Freq: Three times a day (TID) | OPHTHALMIC | Status: DC

## 2015-12-11 MED ORDER — AMOXICILLIN 875 MG PO TABS: 875.0000 mg | ORAL_TABLET | Freq: Two times a day (BID) | ORAL | Status: DC

## 2015-12-11 NOTE — Progress Notes (Signed)
BP 128/69 mmHg  Pulse 79  Temp(Src) 97.9 F (36.6 C)  Ht 5' 11.1" (1.806 m)  Wt 281 lb 3.2 oz (127.551 kg)  BMI 39.11 kg/m2  SpO2 97%   Subjective:    Patient ID: Curtis Ran., male    DOB: [birth year], 55 y.o.   MRN: [id number]  HPI: Curtis Badder. is a 55 y.o. male  Chief Complaint  Patient presents with  . URI    pt states he has a lot of congestion, thick phlegm, and cough. states symptoms started Thursday night or Friday of last week   . Eye Problem    pt states he woke up with his right eye stuck together in the middle of the night    URI  This is a new problem. The current episode started in the past 7 days. The problem has been gradually worsening. There has been no fever. Associated symptoms include congestion, coughing, headaches and a sore throat. Pertinent negatives include no sinus pain. He has tried nothing for the symptoms.  Eye Problem  The right eye is affected. This is a new problem. The current episode started yesterday. The problem occurs constantly. The problem has been gradually worsening. There was no injury mechanism. The pain is mild. There is no known exposure to pink eye. He does not wear contacts. Associated symptoms include eye redness, photophobia and a recent URI. He has tried water for the symptoms. The treatment provided no relief.     Relevant past medical, surgical, family and social history reviewed and updated as indicated. Interim medical history since our last visit reviewed. Allergies and medications reviewed and updated.  Review of Systems  HENT: Positive for congestion and sore throat.   Eyes: Positive for photophobia and redness.  Respiratory: Positive for cough.   Neurological: Positive for headaches.    Per HPI unless specifically indicated above     Objective:    BP 128/69 mmHg  Pulse 79  Temp(Src) 97.9 F (36.6 C)  Ht 5' 11.1" (1.806 m)  Wt 281 lb 3.2 oz (127.551 kg)  BMI 39.11 kg/m2  SpO2 97%  Wt Readings  from Last 3 Encounters:  12/11/15 281 lb 3.2 oz (127.551 kg)  09/13/15 282 lb (127.914 kg)  03/14/15 281 lb (127.461 kg)    Physical Exam  Constitutional: He is oriented to person, place, and time. He appears well-developed and well-nourished. No distress.  HENT:  Head: Normocephalic and atraumatic.  Right Ear: Tympanic membrane and ear canal normal.  Left Ear: Tympanic membrane and ear canal normal.  Nose: Mucosal edema and sinus tenderness present. Right sinus exhibits maxillary sinus tenderness. Left sinus exhibits maxillary sinus tenderness.  Mouth/Throat: Mucous membranes are normal. Oropharyngeal exudate and posterior oropharyngeal edema present.  Eyes: Conjunctivae and lids are normal. Right eye exhibits no discharge. Left eye exhibits no discharge. No scleral icterus.  Cardiovascular: Normal rate and regular rhythm.   Pulmonary/Chest: Effort normal. No respiratory distress.  Abdominal: Normal appearance and bowel sounds are normal. He exhibits no distension. There is no splenomegaly or hepatomegaly. There is no tenderness.  Musculoskeletal: Normal range of motion.  Neurological: He is alert and oriented to person, place, and time.  Skin: Skin is intact. No rash noted. No pallor.  Psychiatric: He has a normal mood and affect. His behavior is normal. Judgment and thought content normal.    Results for orders placed or performed in visit on 09/13/15  [facility], Waived  Result Value Ref  Range   ALT (SGPT) [facility], Waived 38 10 - 47 U/L   AST (SGOT) [facility], Waived 26 11 - 38 U/L   Cholesterol [facility], Waived CANCELED    HDL Chol [facility], Waived CANCELED    Triglycerides [facility],Waived CANCELED   Lipid Panel w/o Chol/HDL Ratio  Result Value Ref Range   Cholesterol, Total 149 100 - 199 mg/dL   Triglycerides 466 (H) 0 - 149 mg/dL   HDL 31 (L) >39 mg/dL   VLDL Cholesterol Cal Comment 5 - 40 mg/dL   LDL Calculated Comment 0 - 99 mg/dL      Assessment & Plan:    Problem List Items Addressed This Visit    None    Visit Diagnoses    Acute recurrent sinusitis, unspecified location    -  Primary    Relevant Medications    amoxicillin (AMOXIL) 875 MG tablet    Conjunctivitis of right eye            Follow up plan: Return if symptoms worsen or fail to improve.

## 2016-02-19 ENCOUNTER — Other Ambulatory Visit: Payer: Self-pay | Admitting: Family Medicine

## 2016-03-18 ENCOUNTER — Other Ambulatory Visit: Payer: Self-pay | Admitting: Family Medicine

## 2016-03-18 ENCOUNTER — Encounter: Payer: Self-pay | Admitting: Family Medicine

## 2016-03-18 ENCOUNTER — Ambulatory Visit (INDEPENDENT_AMBULATORY_CARE_PROVIDER_SITE_OTHER): Payer: BC Managed Care – PPO | Admitting: Family Medicine

## 2016-03-18 VITALS — BP 130/79 | HR 75 | Temp 98.8°F | Ht 69.6 in | Wt 277.5 lb

## 2016-03-18 DIAGNOSIS — K219 Gastro-esophageal reflux disease without esophagitis: Secondary | ICD-10-CM | POA: Diagnosis not present

## 2016-03-18 DIAGNOSIS — G4733 Obstructive sleep apnea (adult) (pediatric): Secondary | ICD-10-CM | POA: Diagnosis not present

## 2016-03-18 DIAGNOSIS — Z0000 Encounter for general adult medical examination without abnormal findings: Secondary | ICD-10-CM

## 2016-03-18 DIAGNOSIS — R200 Anesthesia of skin: Secondary | ICD-10-CM

## 2016-03-18 DIAGNOSIS — M25511 Pain in right shoulder: Secondary | ICD-10-CM | POA: Diagnosis not present

## 2016-03-18 DIAGNOSIS — R0981 Nasal congestion: Secondary | ICD-10-CM | POA: Diagnosis not present

## 2016-03-18 DIAGNOSIS — Z23 Encounter for immunization: Secondary | ICD-10-CM | POA: Diagnosis not present

## 2016-03-18 DIAGNOSIS — Z125 Encounter for screening for malignant neoplasm of prostate: Secondary | ICD-10-CM

## 2016-03-18 DIAGNOSIS — F5101 Primary insomnia: Secondary | ICD-10-CM

## 2016-03-18 DIAGNOSIS — E782 Mixed hyperlipidemia: Secondary | ICD-10-CM

## 2016-03-18 DIAGNOSIS — R202 Paresthesia of skin: Secondary | ICD-10-CM | POA: Diagnosis not present

## 2016-03-18 DIAGNOSIS — T7840XD Allergy, unspecified, subsequent encounter: Secondary | ICD-10-CM | POA: Diagnosis not present

## 2016-03-18 DIAGNOSIS — Z1159 Encounter for screening for other viral diseases: Secondary | ICD-10-CM

## 2016-03-18 MED ORDER — MELOXICAM 15 MG PO TABS: 15.0000 mg | ORAL_TABLET | Freq: Every day | ORAL | 1 refills | Status: DC

## 2016-03-18 MED ORDER — RABEPRAZOLE SODIUM 20 MG PO TBEC: 20.0000 mg | DELAYED_RELEASE_TABLET | Freq: Every day | ORAL | 1 refills | Status: DC

## 2016-03-18 MED ORDER — SIMVASTATIN 40 MG PO TABS: 40.0000 mg | ORAL_TABLET | Freq: Every day | ORAL | 1 refills | Status: DC

## 2016-03-18 MED ORDER — ASPIRIN 81 MG PO TABS: 81.0000 mg | ORAL_TABLET | Freq: Every day | ORAL | 4 refills | Status: DC

## 2016-03-18 MED ORDER — TRAZODONE HCL 50 MG PO TABS: 100.0000 mg | ORAL_TABLET | Freq: Every day | ORAL | 1 refills | Status: DC

## 2016-03-18 NOTE — Assessment & Plan Note (Signed)
Stable. Doing better. Continue to monitor.

## 2016-03-18 NOTE — Assessment & Plan Note (Signed)
Stable. No concerns. Call with any concerns.

## 2016-03-18 NOTE — Assessment & Plan Note (Signed)
Stable. Doing well. Call with any concerns.

## 2016-03-18 NOTE — Patient Instructions (Addendum)
Health Maintenance, Male A healthy lifestyle and preventative care can promote health and wellness.  Maintain regular health, dental, and eye exams.  Eat a healthy diet. Foods like vegetables, fruits, whole grains, low-fat dairy products, and lean protein foods contain the nutrients you need and are low in calories. Decrease your intake of foods high in solid fats, added sugars, and salt. Get information about a proper diet from your health care provider, if necessary.  Regular physical exercise is one of the most important things you can do for your health. Most adults should get at least 150 minutes of moderate-intensity exercise (any activity that increases your heart rate and causes you to sweat) each week. In addition, most adults need muscle-strengthening exercises on 2 or more days a week.   Maintain a healthy weight. The body mass index (BMI) is a screening tool to identify possible weight problems. It provides an estimate of body fat based on height and weight. Your health care provider can find your BMI and can help you achieve or maintain a healthy weight. For males 20 years and older:  A BMI below 18.5 is considered underweight.  A BMI of 18.5 to 24.9 is normal.  A BMI of 25 to 29.9 is considered overweight.  A BMI of 30 and above is considered obese.  Maintain normal blood lipids and cholesterol by exercising and minimizing your intake of saturated fat. Eat a balanced diet with plenty of fruits and vegetables. Blood tests for lipids and cholesterol should begin at age 20 and be repeated every 5 years. If your lipid or cholesterol levels are high, you are over age 50, or you are at high risk for heart disease, you may need your cholesterol levels checked more frequently.Ongoing high lipid and cholesterol levels should be treated with medicines if diet and exercise are not working.  If you smoke, find out from your health care provider how to quit. If you do not use tobacco, do not  start.  Lung cancer screening is recommended for adults aged 55-80 years who are at high risk for developing lung cancer because of a history of smoking. A yearly low-dose CT scan of the lungs is recommended for people who have at least a 30-pack-year history of smoking and are current smokers or have quit within the past 15 years. A pack year of smoking is smoking an average of 1 pack of cigarettes a day for 1 year (for example, a 30-pack-year history of smoking could mean smoking 1 pack a day for 30 years or 2 packs a day for 15 years). Yearly screening should continue until the smoker has stopped smoking for at least 15 years. Yearly screening should be stopped for people who develop a health problem that would prevent them from having lung cancer treatment.  If you choose to drink alcohol, do not have more than 2 drinks per day. One drink is considered to be 12 oz (360 mL) of beer, 5 oz (150 mL) of wine, or 1.5 oz (45 mL) of liquor.  Avoid the use of street drugs. Do not share needles with anyone. Ask for help if you need support or instructions about stopping the use of drugs.  High blood pressure causes heart disease and increases the risk of stroke. High blood pressure is more likely to develop in:  People who have blood pressure in the end of the normal range (100-139/85-89 mm Hg).  People who are overweight or obese.  People who are African American.    If you are 18-39 years of age, have your blood pressure checked every 3-5 years. If you are 40 years of age or older, have your blood pressure checked every year. You should have your blood pressure measured twice--once when you are at a hospital or clinic, and once when you are not at a hospital or clinic. Record the average of the two measurements. To check your blood pressure when you are not at a hospital or clinic, you can use:  An automated blood pressure machine at a pharmacy.  A home blood pressure monitor.  If you are 45-79 years  old, ask your health care provider if you should take aspirin to prevent heart disease.  Diabetes screening involves taking a blood sample to check your fasting blood sugar level. This should be done once every 3 years after age 45 if you are at a normal weight and without risk factors for diabetes. Testing should be considered at a younger age or be carried out more frequently if you are overweight and have at least 1 risk factor for diabetes.  Colorectal cancer can be detected and often prevented. Most routine colorectal cancer screening begins at the age of 50 and continues through age 75. However, your health care provider may recommend screening at an earlier age if you have risk factors for colon cancer. On a yearly basis, your health care provider may provide home test kits to check for hidden blood in the stool. A small camera at the end of a tube may be used to directly examine the colon (sigmoidoscopy or colonoscopy) to detect the earliest forms of colorectal cancer. Talk to your health care provider about this at age 50 when routine screening begins. A direct exam of the colon should be repeated every 5-10 years through age 75, unless early forms of precancerous polyps or small growths are found.  People who are at an increased risk for hepatitis B should be screened for this virus. You are considered at high risk for hepatitis B if:  You were born in a country where hepatitis B occurs often. Talk with your health care provider about which countries are considered high risk.  Your parents were born in a high-risk country and you have not received a shot to protect against hepatitis B (hepatitis B vaccine).  You have HIV or AIDS.  You use needles to inject street drugs.  You live with, or have sex with, someone who has hepatitis B.  You are a man who has sex with other men (MSM).  You get hemodialysis treatment.  You take certain medicines for conditions like cancer, organ  transplantation, and autoimmune conditions.  Hepatitis C blood testing is recommended for all people born from 1945 through 1965 and any individual with known risk factors for hepatitis C.  Healthy men should no longer receive prostate-specific antigen (PSA) blood tests as part of routine cancer screening. Talk to your health care provider about prostate cancer screening.  Testicular cancer screening is not recommended for adolescents or adult males who have no symptoms. Screening includes self-exam, a health care provider exam, and other screening tests. Consult with your health care provider about any symptoms you have or any concerns you have about testicular cancer.  Practice safe sex. Use condoms and avoid high-risk sexual practices to reduce the spread of sexually transmitted infections (STIs).  You should be screened for STIs, including gonorrhea and chlamydia if:  You are sexually active and are younger than 24 years.  You   are older than 24 years, and your health care provider tells you that you are at risk for this type of infection.  Your sexual activity has changed since you were last screened, and you are at an increased risk for chlamydia or gonorrhea. Ask your health care provider if you are at risk.  If you are at risk of being infected with HIV, it is recommended that you take a prescription medicine daily to prevent HIV infection. This is called pre-exposure prophylaxis (PrEP). You are considered at risk if:  You are a man who has sex with other men (MSM).  You are a heterosexual man who is sexually active with multiple partners.  You take drugs by injection.  You are sexually active with a partner who has HIV.  Talk with your health care provider about whether you are at high risk of being infected with HIV. If you choose to begin PrEP, you should first be tested for HIV. You should then be tested every 3 months for as long as you are taking PrEP.  Use sunscreen. Apply  sunscreen liberally and repeatedly throughout the day. You should seek shade when your shadow is shorter than you. Protect yourself by wearing long sleeves, pants, a wide-brimmed hat, and sunglasses year round whenever you are outdoors.  Tell your health care provider of new moles or changes in moles, especially if there is a change in shape or color. Also, tell your health care provider if a mole is larger than the size of a pencil eraser.  A one-time screening for abdominal aortic aneurysm (AAA) and surgical repair of large AAAs by ultrasound is recommended for men aged 68-75 years who are current or former smokers.  Stay current with your vaccines (immunizations).   This information is not intended to replace advice given to you by your health care provider. Make sure you discuss any questions you have with your health care provider.   Document Released: 11/08/2007 Document Revised: 06/02/2014 Document Reviewed: 10/07/2010 Elsevier Interactive Patient Education 2016 [employer]. Tdap Vaccine (Tetanus, Diphtheria and Pertussis): What You Need to Know 1. Why get vaccinated? Tetanus, diphtheria and pertussis are very serious diseases. Tdap vaccine can protect [place] from these diseases. And, Tdap vaccine given to pregnant women can protect newborn babies against pertussis. TETANUS (Lockjaw) is rare in the [place] today. It causes painful muscle tightening and stiffness, usually all over the body.  It can lead to tightening of muscles in the head and neck so you can't open your mouth, swallow, or sometimes even breathe. Tetanus kills about 1 out of 10 people who are infected even after receiving the best medical care. DIPHTHERIA is also rare in the [place] today. It can cause a thick coating to form in the back of the throat.  It can lead to breathing problems, heart failure, paralysis, and death. PERTUSSIS (Whooping Cough) causes severe coughing spells, which can cause difficulty  breathing, vomiting and disturbed sleep.  It can also lead to weight loss, incontinence, and rib fractures. Up to 2 in 100 adolescents and 5 in 100 adults with pertussis are hospitalized or have complications, which could include pneumonia or death. These diseases are caused by bacteria. Diphtheria and pertussis are spread from person to person through secretions from coughing or sneezing. Tetanus enters the body through cuts, scratches, or wounds. Before vaccines, as many as 200,000 cases of diphtheria, 200,000 cases of pertussis, and hundreds of cases of tetanus, were reported in the [place] each  year. Since vaccination began, reports of cases for tetanus and diphtheria have dropped by about 99% and for pertussis by about 80%. 2. Tdap vaccine Tdap vaccine can protect adolescents and adults from tetanus, diphtheria, and pertussis. One dose of Tdap is routinely given at age 6 or 33. People who did not get Tdap at that age should get it as soon as possible. Tdap is especially important for healthcare professionals and anyone having close contact with a baby younger than 12 months. Pregnant women should get a dose of Tdap during every pregnancy, to protect the newborn from pertussis. Infants are most at risk for severe, life-threatening complications from pertussis. Another vaccine, called Td, protects against tetanus and diphtheria, but not pertussis. A Td booster should be given every 10 years. Tdap may be given as one of these boosters if you have never gotten Tdap before. Tdap may also be given after a severe cut or burn to prevent tetanus infection. Your doctor or the person giving you the vaccine can give you more information. Tdap may safely be given at the same time as other vaccines. 3. Some people should not get this vaccine  A person who has ever had a life-threatening allergic reaction after a previous dose of any diphtheria, tetanus or pertussis containing vaccine, OR has a severe  allergy to any part of this vaccine, should not get Tdap vaccine. Tell the person giving the vaccine about any severe allergies.  Anyone who had coma or long repeated seizures within 7 days after a childhood dose of DTP or DTaP, or a previous dose of Tdap, should not get Tdap, unless a cause other than the vaccine was found. They can still get Td.  Talk to your doctor if you:  have seizures or another nervous system problem,  had severe pain or swelling after any vaccine containing diphtheria, tetanus or pertussis,  ever had a condition called Guillain-Barr Syndrome (GBS),  aren't feeling well on the day the shot is scheduled. 4. Risks With any medicine, including vaccines, there is a chance of side effects. These are usually mild and go away on their own. Serious reactions are also possible but are rare. Most people who get Tdap vaccine do not have any problems with it. Mild problems following Tdap (Did not interfere with activities)  Pain where the shot was given (about 3 in 4 adolescents or 2 in 3 adults)  Redness or swelling where the shot was given (about 1 person in 5)  Mild fever of at least 100.27F (up to about 1 in 25 adolescents or 1 in 100 adults)  Headache (about 3 or 4 people in 10)  Tiredness (about 1 person in 3 or 4)  Nausea, vomiting, diarrhea, stomach ache (up to 1 in 4 adolescents or 1 in 10 adults)  Chills, sore joints (about 1 person in 10)  Body aches (about 1 person in 3 or 4)  Rash, swollen glands (uncommon) Moderate problems following Tdap (Interfered with activities, but did not require medical attention)  Pain where the shot was given (up to 1 in 5 or 6)  Redness or swelling where the shot was given (up to about 1 in 16 adolescents or 1 in 12 adults)  Fever over 102F (about 1 in 100 adolescents or 1 in 250 adults)  Headache (about 1 in 7 adolescents or 1 in 10 adults)  Nausea, vomiting, diarrhea, stomach ache (up to 1 or 3 people in  100)  Swelling of the entire arm where  the shot was given (up to about 1 in 500). Severe problems following Tdap (Unable to perform usual activities; required medical attention)  Swelling, severe pain, bleeding and redness in the arm where the shot was given (rare). Problems that could happen after any vaccine:  People sometimes faint after a medical procedure, including vaccination. Sitting or lying down for about 15 minutes can help prevent fainting, and injuries caused by a fall. Tell your doctor if you feel dizzy, or have vision changes or ringing in the ears.  Some people get severe pain in the shoulder and have difficulty moving the arm where a shot was given. This happens very rarely.  Any medication can cause a severe allergic reaction. Such reactions from a vaccine are very rare, estimated at fewer than 1 in a million doses, and would happen within a few minutes to a few hours after the vaccination. As with any medicine, there is a very remote chance of a vaccine causing a serious injury or death. The safety of vaccines is always being monitored. For more information, visit: http://www.aguilar.org/ 5. What if there is a serious problem? What should I look for?  Look for anything that concerns you, such as signs of a severe allergic reaction, very high fever, or unusual behavior.  Signs of a severe allergic reaction can include hives, swelling of the face and throat, difficulty breathing, a fast heartbeat, dizziness, and weakness. These would usually start a few minutes to a few hours after the vaccination. What should I do?  If you think it is a severe allergic reaction or other emergency that can't wait, call 9-1-1 or get the person to the nearest hospital. Otherwise, call your doctor.  Afterward, the reaction should be reported to the Vaccine Adverse Event Reporting System (VAERS). Your doctor might file this report, or you can do it yourself through the VAERS web site at  www.vaers.SamedayNews.es, or by calling (775) 289-9487. VAERS does not give medical advice.  6. The National Vaccine Injury Compensation Program The [employer] Vaccine Injury Compensation Program (VICP) is a federal program that was created to compensate people who may have been injured by certain vaccines. Persons who believe they may have been injured by a vaccine can learn about the program and about filing a claim by calling 279-408-2068 or visiting the [place] website at GoldCloset.com.ee. There is a time limit to file a claim for compensation. 7. How can I learn more?  Ask your doctor. He or she can give you the vaccine package insert or suggest other sources of information.  Call your local or state health department.  Contact the Centers for Disease Control and Prevention (CDC):  Call 726-253-7703 (1-800-CDC-INFO) or  Visit CDC's website at http://hunter.com/ CDC Tdap Vaccine VIS (07/19/13)   This information is not intended to replace advice given to you by your health care provider. Make sure you discuss any questions you have with your health care provider.   Document Released: 11/11/2011 Document Revised: 06/02/2014 Document Reviewed: 08/24/2013 Elsevier Interactive Patient Education 2016 Elsevier Inc. Shingles Vaccine: What You Need to Know WHAT IS SHINGLES?  Shingles is a painful skin rash, often with blisters. It is also called Herpes Zoster or just Zoster.  A shingles rash usually appears on one side of the face or body and lasts from 2 to 4 weeks. Its main symptom is pain, which can be quite severe. Other symptoms of shingles can include fever, headache, chills, and upset stomach. Very rarely, a shingles infection can lead to  pneumonia, hearing problems, blindness, brain inflammation (encephalitis), or death.  For about 1 person in 5, severe pain can continue even after the rash clears up. This is called post-herpetic neuralgia.  Shingles is caused by the  Varicella Zoster virus. This is the same virus that causes chickenpox. Only someone who has had a case of chickenpox or rarely, has gotten chickenpox vaccine, can get shingles. The virus stays in your body. It can reappear many years later to cause a case of shingles.  You cannot catch shingles from another person with shingles. However, a person who has never had chickenpox (or chickenpox vaccine) could get chickenpox from someone with shingles. This is not very common.  Shingles is far more common in people 55 and older than in younger people. It is also more common in people whose immune systems are weakened because of a disease such as cancer or drugs such as steroids or chemotherapy.  At least 1 million people get shingles per year in the [place]. SHINGLES VACCINE  A vaccine for shingles was licensed in 123456. In clinical trials, the vaccine reduced the risk of shingles by 50%. It can also reduce the pain in people who still get shingles after being vaccinated.  A single dose of shingles vaccine is recommended for adults 45 years of age and older. SOME PEOPLE SHOULD NOT GET SHINGLES VACCINE OR SHOULD WAIT A person should not get shingles vaccine if he or she:  Has ever had a life-threatening allergic reaction to gelatin, the antibiotic neomycin, or any other component of shingles vaccine. Tell your caregiver if you have any severe allergies.  Has a weakened immune system because of current:  AIDS or another disease that affects the immune system.  Treatment with drugs that affect the immune system, such as prolonged use of high-dose steroids.  Cancer treatment, such as radiation or chemotherapy.  Cancer affecting the bone marrow or lymphatic system, such as leukemia or lymphoma.  Is pregnant, or might be pregnant. Women should not become pregnant until at least 4 weeks after getting shingles vaccine. Someone with a minor illness, such as a cold, may be vaccinated. Anyone with a  moderate or severe acute illness should usually wait until he or she recovers before getting the vaccine. This includes anyone with a temperature of 101.3 F (38 C) or higher. WHAT ARE THE RISKS FROM SHINGLES VACCINE?  A vaccine, like any medicine, could possibly cause serious problems, such as severe allergic reactions. However, the risk of a vaccine causing serious harm, or death, is extremely small.  No serious problems have been identified with shingles vaccine. Mild Problems  Redness, soreness, swelling, or itching at the site of the injection (about 1 person in 3).  Headache (about 1 person in 16). Like all vaccines, shingles vaccine is being closely monitored for unusual or severe problems. WHAT IF THERE IS A MODERATE OR SEVERE REACTION? What should I look for? Any unusual condition, such as a severe allergic reaction or a high fever. If a severe allergic reaction occurred, it would be within a few minutes to an hour after the shot. Signs of a serious allergic reaction can include difficulty breathing, weakness, hoarseness or wheezing, a fast heartbeat, hives, dizziness, paleness, or swelling of the throat. What should I do?  Call your caregiver, or get the person to a caregiver right away.  Tell the caregiver what happened, the date and time it happened, and when the vaccination was given.  Ask the caregiver to  report the reaction by filing a Vaccine Adverse Event Reporting System (VAERS) form. Or, you can file this report through the VAERS web site at www.vaers.SamedayNews.es or by calling 262-332-4047. VAERS does not provide medical advice. HOW CAN I LEARN MORE?  Ask your caregiver. He or she can give you the vaccine package insert or suggest other sources of information.  Contact the Centers for Disease Control and Prevention (CDC):  Call (972) 474-0471 (1-800-CDC-INFO).  Visit the CDC website at http://hunter.com/ CDC Shingles Vaccine VIS (02/29/08)   This information is  not intended to replace advice given to you by your health care provider. Make sure you discuss any questions you have with your health care provider.   Document Released: 03/09/2006 Document Revised: 09/26/2014 Document Reviewed: 09/01/2012 Elsevier Interactive Patient Education 2016 Elsevier Inc.  Generic Shoulder Exercises EXERCISES  RANGE OF MOTION (ROM) AND STRETCHING EXERCISES These exercises may help you when beginning to rehabilitate your injury. Your symptoms may resolve with or without further involvement from your physician, physical therapist or athletic trainer. While completing these exercises, remember:   Restoring tissue flexibility helps normal motion to return to the joints. This allows healthier, less painful movement and activity.  An effective stretch should be held for at least 30 seconds.  A stretch should never be painful. You should only feel a gentle lengthening or release in the stretched tissue. ROM - Pendulum  Bend at the waist so that your right / left arm falls away from your body. Support yourself with your opposite hand on a solid surface, such as a table or a countertop.  Your right / left arm should be perpendicular to the ground. If it is not perpendicular, you need to lean over farther. Relax the muscles in your right / left arm and shoulder as much as possible.  Gently sway your hips and trunk so they move your right / left arm without any use of your right / left shoulder muscles.  Progress your movements so that your right / left arm moves side to side, then forward and backward, and finally, both clockwise and counterclockwise.  Complete __________ repetitions in each direction. Many people use this exercise to relieve discomfort in their shoulder as well as to gain range of motion. Repeat __________ times. Complete this exercise __________ times per day. STRETCH - Flexion, Standing  Stand with good posture. With an underhand grip on your right /  left hand and an overhand grip on the opposite hand, grasp a broomstick or cane so that your hands are a little more than shoulder-width apart.  Keeping your right / left elbow straight and shoulder muscles relaxed, push the stick with your opposite hand to raise your right / left arm in front of your body and then overhead. Raise your arm until you feel a stretch in your right / left shoulder, but before you have increased shoulder pain.  Try to avoid shrugging your right / left shoulder as your arm rises by keeping your shoulder blade tucked down and toward your mid-back spine. Hold __________ seconds.  Slowly return to the starting position. Repeat __________ times. Complete this exercise __________ times per day. STRETCH - Internal Rotation  Place your right / left hand behind your back, palm-up.  Throw a towel or belt over your opposite shoulder. Grasp the towel/belt with your right / left hand.  While keeping an upright posture, gently pull up on the towel/belt until you feel a stretch in the front of your right /  left shoulder.  Avoid shrugging your right / left shoulder as your arm rises by keeping your shoulder blade tucked down and toward your mid-back spine.  Hold __________. Release the stretch by lowering your opposite hand. Repeat __________ times. Complete this exercise __________ times per day. STRETCH - External Rotation and Abduction  Stagger your stance through a doorframe. It does not matter which foot is forward.  As instructed by your physician, physical therapist or athletic trainer, place your hands:  And forearms above your head and on the door frame.  And forearms at head-height and on the door frame.  At elbow-height and on the door frame.  Keeping your head and chest upright and your stomach muscles tight to prevent over-extending your low-back, slowly shift your weight onto your front foot until you feel a stretch across your chest and/or in the front of  your shoulders.  Hold __________ seconds. Shift your weight to your back foot to release the stretch. Repeat __________ times. Complete this stretch __________ times per day.  STRENGTHENING EXERCISES  These exercises may help you when beginning to rehabilitate your injury. They may resolve your symptoms with or without further involvement from your physician, physical therapist or athletic trainer. While completing these exercises, remember:   Muscles can gain both the endurance and the strength needed for everyday activities through controlled exercises.  Complete these exercises as instructed by your physician, physical therapist or athletic trainer. Progress the resistance and repetitions only as guided.  You may experience muscle soreness or fatigue, but the pain or discomfort you are trying to eliminate should never worsen during these exercises. If this pain does worsen, stop and make certain you are following the directions exactly. If the pain is still present after adjustments, discontinue the exercise until you can discuss the trouble with your clinician.  If advised by your physician, during your recovery, avoid activity or exercises which involve actions that place your right / left hand or elbow above your head or behind your back or head. These positions stress the tissues which are trying to heal. STRENGTH - Scapular Depression and Adduction  With good posture, sit on a firm chair. Supported your arms in front of you with pillows, arm rests or a table top. Have your elbows in line with the sides of your body.  Gently draw your shoulder blades down and toward your mid-back spine. Gradually increase the tension without tensing the muscles along the top of your shoulders and the back of your neck.  Hold for __________ seconds. Slowly release the tension and relax your muscles completely before completing the next repetition.  After you have practiced this exercise, remove the arm  support and complete it in standing as well as sitting. Repeat __________ times. Complete this exercise __________ times per day.  STRENGTH - External Rotators  Secure a rubber exercise band/tubing to a fixed object so that it is at the same height as your right / left elbow when you are standing or sitting on a firm surface.  Stand or sit so that the secured exercise band/tubing is at your side that is not injured.  Bend your elbow 90 degrees. Place a folded towel or small pillow under your right / left arm so that your elbow is a few inches away from your side.  Keeping the tension on the exercise band/tubing, pull it away from your body, as if pivoting on your elbow. Be sure to keep your body steady so that the movement  is only coming from your shoulder rotating.  Hold __________ seconds. Release the tension in a controlled manner as you return to the starting position. Repeat __________ times. Complete this exercise __________ times per day.  STRENGTH - Supraspinatus  Stand or sit with good posture. Grasp a __________ weight or an exercise band/tubing so that your hand is "thumbs-up," like when you shake hands.  Slowly lift your right / left hand from your thigh into the air, traveling about 30 degrees from straight out at your side. Lift your hand to shoulder height or as far as you can without increasing any shoulder pain. Initially, many people do not lift their hands above shoulder height.  Avoid shrugging your right / left shoulder as your arm rises by keeping your shoulder blade tucked down and toward your mid-back spine.  Hold for __________ seconds. Control the descent of your hand as you slowly return to your starting position. Repeat __________ times. Complete this exercise __________ times per day.  STRENGTH - Shoulder Extensors  Secure a rubber exercise band/tubing so that it is at the height of your shoulders when you are either standing or sitting on a firm arm-less  chair.  With a thumbs-up grip, grasp an end of the band/tubing in each hand. Straighten your elbows and lift your hands straight in front of you at shoulder height. Step back away from the secured end of band/tubing until it becomes tense.  Squeezing your shoulder blades together, pull your hands down to the sides of your thighs. Do not allow your hands to go behind you.  Hold for __________ seconds. Slowly ease the tension on the band/tubing as you reverse the directions and return to the starting position. Repeat __________ times. Complete this exercise __________ times per day.  STRENGTH - Scapular Retractors  Secure a rubber exercise band/tubing so that it is at the height of your shoulders when you are either standing or sitting on a firm arm-less chair.  With a palm-down grip, grasp an end of the band/tubing in each hand. Straighten your elbows and lift your hands straight in front of you at shoulder height. Step back away from the secured end of band/tubing until it becomes tense.  Squeezing your shoulder blades together, draw your elbows back as you bend them. Keep your upper arm lifted away from your body throughout the exercise.  Hold __________ seconds. Slowly ease the tension on the band/tubing as you reverse the directions and return to the starting position. Repeat __________ times. Complete this exercise __________ times per day. STRENGTH - Scapular Depressors  Find a sturdy chair without wheels, such as a from a dining room table.  Keeping your feet on the floor, lift your bottom from the seat and lock your elbows.  Keeping your elbows straight, allow gravity to pull your body weight down. Your shoulders will rise toward your ears.  Raise your body against gravity by drawing your shoulder blades down your back, shortening the distance between your shoulders and ears. Although your feet should always maintain contact with the floor, your feet should progressively support less  body weight as you get stronger.  Hold __________ seconds. In a controlled and slow manner, lower your body weight to begin the next repetition. Repeat __________ times. Complete this exercise __________ times per day.    This information is not intended to replace advice given to you by your health care provider. Make sure you discuss any questions you have with your health care provider.  Document Released: 03/26/2005 Document Revised: 06/02/2014 Document Reviewed: 08/24/2008 Elsevier Interactive Patient Education [employer].

## 2016-03-18 NOTE — Assessment & Plan Note (Signed)
Stable. Doing well. Rx for tubing given today.

## 2016-03-18 NOTE — Assessment & Plan Note (Signed)
Stable. Refills given today. Call with any concerns.  

## 2016-03-18 NOTE — Assessment & Plan Note (Signed)
Stable. Continue current regimen. Call with any concerns.  

## 2016-03-18 NOTE — Assessment & Plan Note (Signed)
Rechecking levels fasting on Thursday. Will adjust as needed.

## 2016-03-18 NOTE — Progress Notes (Signed)
BP 130/79 (BP Location: Left Arm, Patient Position: Sitting, Cuff Size: Large)   Pulse 75   Temp 98.8 F (37.1 C)   Ht 5' 9.6" (1.768 m)   Wt 277 lb 8 oz (125.9 kg)   SpO2 95%   BMI 40.28 kg/m    Subjective:    Patient ID: Curtis Ran., male    DOB: 1960/07/02, 55 y.o.   MRN: [id number]  HPI: Curtis Bray. is a 55 y.o. male presenting on 03/18/2016 for comprehensive medical examination. Current medical complaints include:  HYPERLIPIDEMIA Hyperlipidemia status: excellent compliance Satisfied with current treatment?  yes Side effects:  yes- occasional joint pain Medication compliance: excellent compliance Past cholesterol meds: simvastatin (zocor) Supplements: none Aspirin:  yes Chest pain:  no Coronary artery disease:  no Family history CAD:  yes  SHOULDER PAIN Duration: 3-4 weeks Involved shoulder: right Mechanism of injury: unknown Location: posterior Onset:sudden Severity: moderate  Quality:  crick Frequency: constant Radiation: no Aggravating factors: lifting  Alleviating factors: Nothing   Status: stable Treatments attempted: rest and ibuprofen  Relief with NSAIDs?:  unknown Weakness: no Numbness: no Decreased grip strength: no Redness: no Swelling: no Bruising: no Fevers: no  Needs a new mask and hose for his CPAP  He currently lives with: wife Interim Problems from his last visit: yes  Depression Screen done today and results listed below:  Depression screen [facility] 2/9 03/18/2016 03/14/2015  Decreased Interest 0 0  Down, Depressed, Hopeless 0 1  PHQ - 2 Score 0 1  Altered sleeping - 0  Tired, decreased energy - 0  Change in appetite - 1  Feeling bad or failure about yourself  - 0  Trouble concentrating - 0  Moving slowly or fidgety/restless - 0  Suicidal thoughts - 0  PHQ-9 Score - 2  Difficult doing work/chores - Not difficult at all   Past Medical History:  Past Medical History:  Diagnosis Date  . Allergy   . Edema   .  GERD (gastroesophageal reflux disease)   . Hyperlipidemia   . Personal history of colonic polyps   . Sleep apnea    Bi-pap  . Sleep apnea   . TIA (transient ischemic attack) 07/2011  . Vision impairment    Blindness Right Eye   Surgical History:  Past Surgical History:  Procedure Laterality Date  . APPENDECTOMY    . carpel tunnel Bilateral   . COLONOSCOPY  2015   Found 2 beign polyps, repeat 10 years  . TONSILLECTOMY      Medications:  Current Outpatient Prescriptions on File Prior to Visit  Medication Sig  . Cholecalciferol (VITAMIN D-1000 MAX ST) 1000 units tablet Take by mouth.  . Coenzyme Q10 (CO Q 10) 10 MG CAPS Take by mouth.  . Cyanocobalamin (RA VITAMIN B-12 TR) 1000 MCG TBCR Take by mouth.   No current facility-administered medications on file prior to visit.     Allergies:  No Known Allergies  Social History:  Social History   Social History  . Marital status: Married    Spouse name: N/A  . Number of children: N/A  . Years of education: N/A   Occupational History  . Not on file.   Social History Main Topics  . Smoking status: Former Smoker    Years: 18.00    Quit date: 05/27/1995  . Smokeless tobacco: Never Used  . Alcohol use 0.0 oz/week     Comment: Rare  . Drug use: No  . Sexual  activity: Yes   Other Topics Concern  . Not on file   Social History Narrative  . No narrative on file   History  Smoking Status  . Former Smoker  . Years: 18.00  . Quit date: 05/27/1995  Smokeless Tobacco  . Never Used   History  Alcohol Use  . 0.0 oz/week    Comment: Rare    Family History:  Family History  Problem Relation Age of Onset  . Hearing loss Father   . Hypertension Father   . Asthma Son   . Cancer Maternal Grandmother     Colon    Past medical history, surgical history, medications, allergies, family history and social history reviewed with patient today and changes made to appropriate areas of the chart.   Review of Systems    Constitutional: Negative.   HENT: Positive for tinnitus. Negative for congestion, ear discharge, ear pain, hearing loss, nosebleeds and sore throat.   Eyes: Negative.   Respiratory: Positive for shortness of breath. Negative for cough, hemoptysis, sputum production, wheezing and stridor.   Cardiovascular: Positive for leg swelling. Negative for chest pain, palpitations, orthopnea, claudication and PND.  Gastrointestinal: Negative for abdominal pain, blood in stool, constipation, diarrhea, heartburn, melena, nausea and vomiting.  Genitourinary: Negative.   Musculoskeletal: Positive for joint pain. Negative for back pain, falls, myalgias and neck pain.  Skin: Negative.   Neurological: Negative.  Negative for headaches.  Endo/Heme/Allergies: Positive for environmental allergies. Negative for polydipsia. Does not bruise/bleed easily.  Psychiatric/Behavioral: Negative.     All other ROS negative except what is listed above and in the HPI.      Objective:    BP 130/79 (BP Location: Left Arm, Patient Position: Sitting, Cuff Size: Large)   Pulse 75   Temp 98.8 F (37.1 C)   Ht 5' 9.6" (1.768 m)   Wt 277 lb 8 oz (125.9 kg)   SpO2 95%   BMI 40.28 kg/m   Wt Readings from Last 3 Encounters:  03/18/16 277 lb 8 oz (125.9 kg)  12/11/15 281 lb 3.2 oz (127.6 kg)  09/13/15 282 lb (127.9 kg)    Physical Exam  Constitutional: He is oriented to person, place, and time. He appears well-developed and well-nourished. No distress.  HENT:  Head: Normocephalic and atraumatic.  Right Ear: Hearing, tympanic membrane, external ear and ear canal normal.  Left Ear: Hearing, tympanic membrane, external ear and ear canal normal.  Nose: Nose normal.  Mouth/Throat: Uvula is midline, oropharynx is clear and moist and mucous membranes are normal. No oropharyngeal exudate.  Eyes: Conjunctivae, EOM and lids are normal. Pupils are equal, round, and reactive to light. Right eye exhibits no discharge. Left eye  exhibits no discharge. No scleral icterus.  Neck: Normal range of motion. Neck supple. No JVD present. No tracheal deviation present. No thyromegaly present.  Cardiovascular: Normal rate, regular rhythm, normal heart sounds and intact distal pulses.  Exam reveals no gallop and no friction rub.   No murmur heard. Pulmonary/Chest: Effort normal and breath sounds normal. No stridor. No respiratory distress. He has no wheezes. He has no rales. He exhibits no tenderness.  Abdominal: Soft. Bowel sounds are normal. He exhibits no distension and no mass. There is no tenderness. There is no rebound and no guarding. Hernia confirmed negative in the right inguinal area and confirmed negative in the left inguinal area.  Genitourinary: Rectum normal, prostate normal, testes normal and penis normal. Prostate is not enlarged and not tender. Right testis shows  no mass, no swelling and no tenderness. Right testis is descended. Cremasteric reflex is not absent on the right side. Left testis shows no mass, no swelling and no tenderness. Left testis is descended. Cremasteric reflex is not absent on the left side. Circumcised. No penile tenderness.  Musculoskeletal: Normal range of motion. He exhibits no edema, tenderness or deformity.  Lymphadenopathy:    He has no cervical adenopathy.  Neurological: He is alert and oriented to person, place, and time. He has normal reflexes. He displays normal reflexes. No cranial nerve deficit. He exhibits normal muscle tone. Coordination normal.  Skin: Skin is warm, dry and intact. No rash noted. He is not diaphoretic. No erythema. No pallor.  Psychiatric: He has a normal mood and affect. His speech is normal and behavior is normal. Judgment and thought content normal. Cognition and memory are normal.  Nursing note and vitals reviewed.    Shoulder: right    Inspection:  no swelling, ecchymosis, erythema or step off deformity.     Tenderness to Palpation:    Acromion: no    AC  joint:no    Clavicle: no    Bicipital groove: no    Scapular spine: no    Coracoid process: no    Humeral head: no    Supraspinatus tendon: no     Range of Motion:  Full Range of Motion bilaterally     Muscle Strength: 5/5 bilaterally     Neuro: Sensation WNL. and Upper extremity reflexes WNL.     Special Tests:     Neer sign: Negative    Hawkins sign: Negative    Cross arm adduction: Negative    Yergason sign: Negative    O'brien sign: Negative     Speed sign: Negative   Results for orders placed or performed in visit on 09/13/15  LP+ALT+AST Piccolo, Waived  Result Value Ref Range   ALT (SGPT) Piccolo, Waived 38 10 - 47 U/L   AST (SGOT) Piccolo, Waived 26 11 - 38 U/L   Cholesterol Piccolo, Waived CANCELED    HDL Chol Piccolo, Waived CANCELED    Triglycerides Piccolo,Waived CANCELED   Lipid Panel w/o Chol/HDL Ratio  Result Value Ref Range   Cholesterol, Total 149 100 - 199 mg/dL   Triglycerides 466 (H) 0 - 149 mg/dL   HDL 31 (L) >39 mg/dL   VLDL Cholesterol Cal Comment 5 - 40 mg/dL   LDL Calculated Comment 0 - 99 mg/dL      Assessment & Plan:   Problem List Items Addressed This Visit      Respiratory   Sleep apnea    Stable. Doing well. Rx for tubing given today.        Digestive   GERD (gastroesophageal reflux disease)    Stable. Refills given today. Call with any concerns.       Relevant Medications   RABEprazole (ACIPHEX) 20 MG tablet   Other Relevant Orders   CBC with Differential/Platelet   Comprehensive metabolic panel   UA/M w/rflx Culture, Routine     Other   Hyperlipidemia    Rechecking levels fasting on Thursday. Will adjust as needed.       Relevant Medications   simvastatin (ZOCOR) 40 MG tablet   aspirin 81 MG tablet   Other Relevant Orders   Comprehensive metabolic panel   Lipid Panel w/o Chol/HDL Ratio   UA/M w/rflx Culture, Routine   Allergy    Stable. Doing well. Call with any concerns.  Chronic nasal congestion     Stable. Doing better. Continue to monitor.       Numbness and tingling of right thumb    Stable. No concerns. Call with any concerns.       Relevant Orders   Comprehensive metabolic panel   TSH   UA/M w/rflx Culture, Routine   Insomnia    Stable. Continue current regimen. Call with any concerns.        Other Visit Diagnoses    Routine general medical examination at a health care facility    -  Primary   Vaccines updated. Screening labs to be checked Thursday when he is fasting. Colonoscopy up to date. Continue diet and exercise.    Relevant Orders   CBC with Differential/Platelet   Comprehensive metabolic panel   Lipid Panel w/o Chol/HDL Ratio   PSA   TSH   UA/M w/rflx Culture, Routine   Screening for prostate cancer       Labs checked today, await results.    Relevant Orders   Comprehensive metabolic panel   PSA   UA/M w/rflx Culture, Routine   Encounter for hepatitis C screening test for low risk patient       Checking labs, await results.    Relevant Orders   Hepatitis C Antibody   Need for Tdap vaccination       Given today.    Relevant Orders   Tdap vaccine greater than or equal to 7yo IM   Acute pain of right shoulder       Will try him on meloxicam and x-rays given. Will recheck by phone in 1 month. Call with any concerns.        Discussed aspirin prophylaxis for myocardial infarction prevention and decision was made to continue ASA  LABORATORY TESTING:  Health maintenance labs ordered today as discussed above.   The natural history of prostate cancer and ongoing controversy regarding screening and potential treatment outcomes of prostate cancer has been discussed with the patient. The meaning of a false positive PSA and a false negative PSA has been discussed. He indicates understanding of the limitations of this screening test and wishes to proceed with screening PSA testing.   IMMUNIZATIONS:   - Tdap: Tetanus vaccination status reviewed: Given today -  Influenza: Up to date - Pneumovax: Refused - Prevnar: Not applicable - Zostavax vaccine: Will get it at the pharmacy.  SCREENING: - Colonoscopy: Up to date  Discussed with patient purpose of the colonoscopy is to detect colon cancer at curable precancerous or early stages   PATIENT COUNSELING:    Sexuality: Discussed sexually transmitted diseases, partner selection, use of condoms, avoidance of unintended pregnancy  and contraceptive alternatives.   Advised to avoid cigarette smoking.  I discussed with the patient that most people either abstain from alcohol or drink within safe limits (<=14/week and <=4 drinks/occasion for males, <=7/weeks and <= 3 drinks/occasion for females) and that the risk for alcohol disorders and other health effects rises proportionally with the number of drinks per week and how often a drinker exceeds daily limits.  Discussed cessation/primary prevention of drug use and availability of treatment for abuse.   Diet: Encouraged to adjust caloric intake to maintain  or achieve ideal body weight, to reduce intake of dietary saturated fat and total fat, to limit sodium intake by avoiding high sodium foods and not adding table salt, and to maintain adequate dietary potassium and calcium preferably from fresh fruits, vegetables, and low-fat dairy products.  stressed the importance of regular exercise  Injury prevention: Discussed safety belts, safety helmets, smoke detector, smoking near bedding or upholstery.   Dental health: Discussed importance of regular tooth brushing, flossing, and dental visits.   Follow up plan: NEXT PREVENTATIVE PHYSICAL DUE IN 1 YEAR. Return in about 6 months (around 09/16/2016) for Follow up.

## 2016-03-20 ENCOUNTER — Other Ambulatory Visit: Payer: BC Managed Care – PPO

## 2016-03-20 ENCOUNTER — Ambulatory Visit (INDEPENDENT_AMBULATORY_CARE_PROVIDER_SITE_OTHER): Payer: BC Managed Care – PPO

## 2016-03-20 DIAGNOSIS — Z87891 Personal history of nicotine dependence: Secondary | ICD-10-CM | POA: Diagnosis not present

## 2016-03-20 DIAGNOSIS — K219 Gastro-esophageal reflux disease without esophagitis: Secondary | ICD-10-CM

## 2016-03-20 DIAGNOSIS — Z0000 Encounter for general adult medical examination without abnormal findings: Secondary | ICD-10-CM

## 2016-03-20 DIAGNOSIS — Z1159 Encounter for screening for other viral diseases: Secondary | ICD-10-CM

## 2016-03-20 DIAGNOSIS — Z23 Encounter for immunization: Secondary | ICD-10-CM

## 2016-03-20 DIAGNOSIS — R202 Paresthesia of skin: Secondary | ICD-10-CM

## 2016-03-20 DIAGNOSIS — R200 Anesthesia of skin: Secondary | ICD-10-CM

## 2016-03-20 DIAGNOSIS — E782 Mixed hyperlipidemia: Secondary | ICD-10-CM

## 2016-03-20 DIAGNOSIS — Z125 Encounter for screening for malignant neoplasm of prostate: Secondary | ICD-10-CM

## 2016-03-20 LAB — UA/M W/RFLX CULTURE, ROUTINE
Bilirubin, UA: NEGATIVE
Glucose, UA: NEGATIVE
KETONES UA: NEGATIVE
LEUKOCYTES UA: NEGATIVE
NITRITE UA: NEGATIVE
PH UA: 7 (ref 5.0–7.5)
PROTEIN UA: NEGATIVE
RBC, UA: NEGATIVE
Specific Gravity, UA: 1.02 (ref 1.005–1.030)
Urobilinogen, Ur: 0.2 mg/dL (ref 0.2–1.0)

## 2016-03-21 ENCOUNTER — Encounter: Payer: Self-pay | Admitting: Family Medicine

## 2016-03-21 LAB — CBC WITH DIFFERENTIAL/PLATELET
BASOS: 0 %
Basophils Absolute: 0 10*3/uL (ref 0.0–0.2)
EOS (ABSOLUTE): 0.2 10*3/uL (ref 0.0–0.4)
EOS: 3 %
HEMATOCRIT: 49 % (ref 37.5–51.0)
HEMOGLOBIN: 16.5 g/dL (ref 12.6–17.7)
Immature Grans (Abs): 0 10*3/uL (ref 0.0–0.1)
Immature Granulocytes: 0 %
LYMPHS ABS: 1.8 10*3/uL (ref 0.7–3.1)
Lymphs: 29 %
MCH: 29.4 pg (ref 26.6–33.0)
MCHC: 33.7 g/dL (ref 31.5–35.7)
MCV: 87 fL (ref 79–97)
MONOCYTES: 10 %
MONOS ABS: 0.6 10*3/uL (ref 0.1–0.9)
NEUTROS ABS: 3.7 10*3/uL (ref 1.4–7.0)
Neutrophils: 58 %
Platelets: 185 10*3/uL (ref 150–379)
RBC: 5.62 x10E6/uL (ref 4.14–5.80)
RDW: 14 % (ref 12.3–15.4)
WBC: 6.3 10*3/uL (ref 3.4–10.8)

## 2016-03-21 LAB — COMPREHENSIVE METABOLIC PANEL
A/G RATIO: 2 (ref 1.2–2.2)
ALBUMIN: 4.3 g/dL (ref 3.5–5.5)
ALK PHOS: 68 IU/L (ref 39–117)
ALT: 26 IU/L (ref 0–44)
AST: 22 IU/L (ref 0–40)
BILIRUBIN TOTAL: 1 mg/dL (ref 0.0–1.2)
BUN / CREAT RATIO: 13 (ref 9–20)
BUN: 15 mg/dL (ref 6–24)
CHLORIDE: 104 mmol/L (ref 96–106)
CO2: 27 mmol/L (ref 18–29)
CREATININE: 1.19 mg/dL (ref 0.76–1.27)
Calcium: 9.5 mg/dL (ref 8.7–10.2)
GFR calc Af Amer: 79 mL/min/{1.73_m2} (ref >59)
GFR calc non Af Amer: 68 mL/min/{1.73_m2} (ref >59)
GLOBULIN, TOTAL: 2.2 g/dL (ref 1.5–4.5)
Glucose: 110 mg/dL — ABNORMAL HIGH (ref 65–99)
POTASSIUM: 4.4 mmol/L (ref 3.5–5.2)
SODIUM: 144 mmol/L (ref 134–144)
Total Protein: 6.5 g/dL (ref 6.0–8.5)

## 2016-03-21 LAB — HEPATITIS C ANTIBODY: Hep C Virus Ab: <0.1 s/co ratio (ref 0.0–0.9)

## 2016-03-21 LAB — LIPID PANEL W/O CHOL/HDL RATIO
Cholesterol, Total: 135 mg/dL (ref 100–199)
HDL: 33 mg/dL — AB (ref >39)
LDL Calculated: 66 mg/dL (ref 0–99)
Triglycerides: 181 mg/dL — ABNORMAL HIGH (ref 0–149)
VLDL CHOLESTEROL CAL: 36 mg/dL (ref 5–40)

## 2016-03-21 LAB — PSA: PROSTATE SPECIFIC AG, SERUM: 3.6 ng/mL (ref 0.0–4.0)

## 2016-03-21 LAB — TSH: TSH: 2 u[IU]/mL (ref 0.450–4.500)

## 2016-04-18 ENCOUNTER — Telehealth: Payer: Self-pay | Admitting: Family Medicine

## 2016-04-18 MED ORDER — MELOXICAM 15 MG PO TABS: 15.0000 mg | ORAL_TABLET | Freq: Every day | ORAL | 1 refills | Status: DC

## 2016-04-18 NOTE — Telephone Encounter (Signed)
-----   Message from Valerie Roys, DO sent at 03/18/2016  3:37 PM EDT ----- Call about his shoulder

## 2016-04-18 NOTE — Telephone Encounter (Signed)
Called to discuss his shoulder. Feeling well on the meloxicam. No more problems. Will try taking it every other day or as needed. Rx sent to his pharmacy.

## 2016-06-03 ENCOUNTER — Other Ambulatory Visit: Payer: Self-pay | Admitting: Family Medicine

## 2016-06-03 NOTE — Telephone Encounter (Signed)
Can you check and see if he already got this, because if he did, he doesn't get another one.

## 2016-06-04 NOTE — Telephone Encounter (Signed)
Left message to call.

## 2016-08-07 ENCOUNTER — Encounter: Payer: Self-pay | Admitting: Family Medicine

## 2016-08-07 ENCOUNTER — Ambulatory Visit (INDEPENDENT_AMBULATORY_CARE_PROVIDER_SITE_OTHER): Payer: BC Managed Care – PPO | Admitting: Family Medicine

## 2016-08-07 VITALS — BP 133/80 | HR 79 | Temp 98.5°F | Ht 70.5 in | Wt 292.0 lb

## 2016-08-07 DIAGNOSIS — L309 Dermatitis, unspecified: Secondary | ICD-10-CM

## 2016-08-07 DIAGNOSIS — G5603 Carpal tunnel syndrome, bilateral upper limbs: Secondary | ICD-10-CM | POA: Diagnosis not present

## 2016-08-07 MED ORDER — PREDNISONE 20 MG PO TABS: 40.0000 mg | ORAL_TABLET | Freq: Every day | ORAL | 0 refills | Status: DC

## 2016-08-07 MED ORDER — TRIAMCINOLONE ACETONIDE 0.1 % EX CREA: 1.0000 "application " | TOPICAL_CREAM | Freq: Two times a day (BID) | CUTANEOUS | 6 refills | Status: DC

## 2016-08-07 NOTE — Progress Notes (Signed)
BP 133/80   Pulse 79   Temp 98.5 F (36.9 C)   Ht 5' 10.5" (1.791 m)   Wt 292 lb (132.5 kg)   SpO2 96%   BMI 41.31 kg/m    Subjective:    Patient ID: Curtis Ran., male    DOB: 07-19-1960, 56 y.o.   MRN: [id number]  HPI: Curtis Watrous. is a 56 y.o. male  Chief Complaint  Patient presents with  . Hand Pain    x 1 month for bilateral hand pain, 1 day for swelling, hurts to unscrew a cap, hurts to make a fist. Sometimes pain goes from neck, down arm and into his fingers.    Hand pain Patient presents to clinic complaining of bilateral hand pain, itching, swelling, and dryness and mild foot itching x 1 day. Denies fever/chills. Patient had a prior similar episode in the past 3 years ago that was attributed to atopic dermatitis. He has not applied any lotions or topical treatment. Denies new environmental exposures, recent medications, dietary changes, recent travel. He has a history of bilateral carpal tunnel surgically treated and has had right hand pain over 1st-3rd digits that radiates to upper arm and neck for the past month. He has a neurology appointment scheduled in a month.   Relevant past medical, surgical, family and social history reviewed and updated as indicated. Interim medical history since our last visit reviewed. Allergies and medications reviewed and updated.  Review of Systems  Constitutional: Negative for chills, fatigue and fever.  HENT: Negative.   Eyes: Negative.   Respiratory: Negative.   Cardiovascular: Negative for leg swelling.  Genitourinary: Negative.   Musculoskeletal: Negative.   Skin: Positive for rash. Negative for color change and pallor.  Neurological: Positive for numbness (right hand).  Psychiatric/Behavioral: Negative.    Per HPI unless specifically indicated above     Objective:    BP 133/80   Pulse 79   Temp 98.5 F (36.9 C)   Ht 5' 10.5" (1.791 m)   Wt 292 lb (132.5 kg)   SpO2 96%   BMI 41.31 kg/m   Wt Readings  from Last 3 Encounters:  08/07/16 292 lb (132.5 kg)  03/18/16 277 lb 8 oz (125.9 kg)  12/11/15 281 lb 3.2 oz (127.6 kg)    Physical Exam  Constitutional: He is oriented to person, place, and time. He appears well-developed and well-nourished. No distress.  HENT:  Head: Normocephalic and atraumatic.  Right Ear: External ear normal.  Left Ear: External ear normal.  Eyes: Conjunctivae are normal. Right eye exhibits no discharge. Left eye exhibits no discharge.  Neck: Normal range of motion. Neck supple.  Pulmonary/Chest: Effort normal.  Abdominal: Soft.  Musculoskeletal: Normal range of motion.  Neurological: He is alert and oriented to person, place, and time.  Neurovascularly intact. No decreased sensation or tingling over all digits.   Skin: Skin is warm and dry. Rash noted.  Bilateral hands have diffuse warmth and mild swelling and are dry and rough to the touch. No cracks or skin breakdown, drainage, tenderness.   Psychiatric: He has a normal mood and affect. His behavior is normal. Judgment and thought content normal.  Nursing note and vitals reviewed.     Assessment & Plan:   Problem List Items Addressed This Visit    None    Visit Diagnoses    Eczema, unspecified type    -  Primary   Prescribed Prednisone and Triamcinolone cream. Advised to apply CeraVe  lotion 2-3 times daily and use gentle soaps.    Bilateral carpal tunnel syndrome       Prescribed Prednisone, continue flexeril prn. He will follow up with neurology at his scheduled appointment.        Follow up plan: Return if symptoms worsen or fail to improve.

## 2016-08-07 NOTE — Patient Instructions (Signed)
Follow up as scheduled.  

## 2016-09-03 ENCOUNTER — Other Ambulatory Visit: Payer: Self-pay | Admitting: Family Medicine

## 2016-09-12 DIAGNOSIS — G5601 Carpal tunnel syndrome, right upper limb: Secondary | ICD-10-CM | POA: Insufficient documentation

## 2016-09-17 ENCOUNTER — Ambulatory Visit (INDEPENDENT_AMBULATORY_CARE_PROVIDER_SITE_OTHER): Payer: BC Managed Care – PPO | Admitting: Family Medicine

## 2016-09-17 ENCOUNTER — Encounter: Payer: Self-pay | Admitting: Family Medicine

## 2016-09-17 VITALS — BP 123/78 | HR 71 | Temp 98.0°F | Wt 289.2 lb

## 2016-09-17 DIAGNOSIS — K219 Gastro-esophageal reflux disease without esophagitis: Secondary | ICD-10-CM | POA: Diagnosis not present

## 2016-09-17 DIAGNOSIS — M25511 Pain in right shoulder: Secondary | ICD-10-CM

## 2016-09-17 DIAGNOSIS — M5030 Other cervical disc degeneration, unspecified cervical region: Secondary | ICD-10-CM | POA: Insufficient documentation

## 2016-09-17 DIAGNOSIS — E782 Mixed hyperlipidemia: Secondary | ICD-10-CM | POA: Diagnosis not present

## 2016-09-17 DIAGNOSIS — F5101 Primary insomnia: Secondary | ICD-10-CM | POA: Diagnosis not present

## 2016-09-17 DIAGNOSIS — G8929 Other chronic pain: Secondary | ICD-10-CM | POA: Insufficient documentation

## 2016-09-17 MED ORDER — TRAZODONE HCL 50 MG PO TABS: 100.0000 mg | ORAL_TABLET | Freq: Every day | ORAL | 1 refills | Status: DC

## 2016-09-17 MED ORDER — RABEPRAZOLE SODIUM 20 MG PO TBEC: 20.0000 mg | DELAYED_RELEASE_TABLET | Freq: Every day | ORAL | 1 refills | Status: DC

## 2016-09-17 MED ORDER — SIMVASTATIN 40 MG PO TABS: 40.0000 mg | ORAL_TABLET | Freq: Every day | ORAL | 1 refills | Status: DC

## 2016-09-17 MED ORDER — GABAPENTIN 100 MG PO CAPS: ORAL_CAPSULE | ORAL | 3 refills | Status: DC

## 2016-09-17 MED ORDER — MELOXICAM 15 MG PO TABS: 15.0000 mg | ORAL_TABLET | Freq: Every day | ORAL | 1 refills | Status: DC

## 2016-09-17 NOTE — Assessment & Plan Note (Signed)
Rechecking levels today. Await results. Continue current regimen. Call with any concerns.  

## 2016-09-17 NOTE — Progress Notes (Signed)
BP 123/78 (BP Location: Left Arm, Patient Position: Sitting, Cuff Size: Large)   Pulse 71   Temp 98 F (36.7 C)   Wt 289 lb 3.2 oz (131.2 kg)   SpO2 97%   BMI 40.91 kg/m    Subjective:    Patient ID: Curtis Ran., male    DOB: 1960/10/26, 56 y.o.   MRN: [id number]  HPI: Curtis Bray. is a 56 y.o. male  Chief Complaint  Patient presents with  . Shoulder Pain  . Hyperlipidemia   HYPERLIPIDEMIA Hyperlipidemia status: excellent compliance Satisfied with current treatment?  no Side effects:  no Medication compliance: excellent compliance Past cholesterol meds: simvastatin Supplements: none Aspirin:  no Chest pain:  no Coronary artery disease:  no Family history CAD:  yes  GERD GERD control status: stable  Satisfied with current treatment? 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 Interim medical history since our last visit reviewed. Allergies and medications reviewed and updated.  Review of Systems  Constitutional: Negative.   Respiratory: Negative.   Cardiovascular: Negative.   Musculoskeletal: Positive for arthralgias, myalgias, neck pain and  neck stiffness. Negative for back pain, gait problem and joint swelling.  Neurological: Positive for numbness. Negative for dizziness, tremors, seizures, syncope, facial asymmetry, speech difficulty, weakness, light-headedness and headaches.  Psychiatric/Behavioral: Negative.     Per HPI unless specifically indicated above     Objective:    BP 123/78 (BP Location: Left Arm, Patient Position: Sitting, Cuff Size: Large)   Pulse 71   Temp 98 F (36.7 C)   Wt 289 lb 3.2 oz (131.2 kg)   SpO2 97%   BMI 40.91 kg/m   Wt Readings from Last 3 Encounters:  09/17/16 289 lb 3.2 oz (131.2 kg)  08/07/16 292 lb (132.5 kg)  03/18/16 277 lb 8 oz (125.9 kg)    Physical Exam  Constitutional: He is oriented to person, place, and time. He appears well-developed and well-nourished. No distress.  HENT:  Head: Normocephalic and atraumatic.  Right Ear: Hearing normal.  Left Ear: Hearing normal.  Nose: Nose normal.  Eyes: Conjunctivae and lids are normal. Right eye exhibits no discharge. Left eye exhibits no discharge. No scleral icterus.  Cardiovascular: Normal rate, regular rhythm, normal heart sounds and intact distal pulses.  Exam reveals no gallop and no friction rub.   No murmur heard. Pulmonary/Chest: Effort normal and breath sounds normal. No respiratory distress. He has no wheezes. He has no rales. He exhibits no tenderness.  Neurological: He is alert and oriented to person, place, and time.  Skin: Skin is warm, dry and intact. No rash noted. No erythema. No pallor.  Psychiatric: He has a normal mood and affect. His speech is normal and behavior is normal. Judgment and  thought content normal. Cognition and memory are normal.  Nursing note and vitals reviewed.    Shoulder: right    Inspection:  no swelling, ecchymosis, erythema or step off deformity.    Tenderness to Palpation:    Acromion: no    AC joint:no    Clavicle: no    Bicipital groove: no    Scapular spine: no    Coracoid process:  no    Humeral head: no    Supraspinatus tendon: no  Deltoid- yes     Range of Motion:  Full Range of Motion bilaterally    Painful arc: no     Muscle Strength: 5/5 bilaterally     Neuro: Sensation WNL. and Upper extremity reflexes WNL.    Results for orders placed or performed in visit on 03/20/16  CBC with Differential/Platelet  Result Value Ref Range   WBC 6.3 3.4 - 10.8 x10E3/uL   RBC 5.62 4.14 - 5.80 x10E6/uL   Hemoglobin 16.5 12.6 - 17.7 g/dL   Hematocrit 49.0 37.5 - 51.0 %   MCV 87 79 - 97 fL   MCH 29.4 26.6 - 33.0 pg   MCHC 33.7 31.5 - 35.7 g/dL   RDW 14.0 12.3 - 15.4 %   Platelets 185 150 - 379 x10E3/uL   Neutrophils 58 Not Estab. %   Lymphs 29 Not Estab. %   Monocytes 10 Not Estab. %   Eos 3 Not Estab. %   Basos 0 Not Estab. %   Neutrophils Absolute 3.7 1.4 - 7.0 x10E3/uL   Lymphocytes Absolute 1.8 0.7 - 3.1 x10E3/uL   Monocytes Absolute 0.6 0.1 - 0.9 x10E3/uL   EOS (ABSOLUTE) 0.2 0.0 - 0.4 x10E3/uL   Basophils Absolute 0.0 0.0 - 0.2 x10E3/uL   Immature Granulocytes 0 Not Estab. %   Immature Grans (Abs) 0.0 0.0 - 0.1 x10E3/uL  Comprehensive metabolic panel  Result Value Ref Range   Glucose 110 (H) 65 - 99 mg/dL   BUN 15 6 - 24 mg/dL   Creatinine, Ser 1.19 0.76 - 1.27 mg/dL   GFR calc non Af Amer 68 >59 mL/min/1.73   GFR calc Af Amer 79 >59 mL/min/1.73   BUN/Creatinine Ratio 13 9 - 20   Sodium 144 134 - 144 mmol/L   Potassium 4.4 3.5 - 5.2 mmol/L   Chloride 104 96 - 106 mmol/L   CO2 27 18 - 29 mmol/L   Calcium 9.5 8.7 - 10.2 mg/dL   Total Protein 6.5 6.0 - 8.5 g/dL   Albumin 4.3 3.5 - 5.5 g/dL   Globulin, Total 2.2 1.5 - 4.5 g/dL   Albumin/Globulin Ratio 2.0 1.2 - 2.2   Bilirubin Total 1.0 0.0 - 1.2 mg/dL   Alkaline Phosphatase 68 39 - 117 IU/L   AST 22 0 - 40 IU/L   ALT 26 0 - 44 IU/L  Lipid Panel w/o Chol/HDL Ratio  Result Value Ref Range   Cholesterol, Total 135 100 - 199 mg/dL   Triglycerides 181 (H) 0 - 149 mg/dL   HDL 33 (L) >39 mg/dL    VLDL Cholesterol Cal 36 5 - 40 mg/dL   LDL Calculated 66 0 - 99 mg/dL  PSA  Result Value Ref Range   Prostate Specific Ag, Serum 3.6 0.0 - 4.0 ng/mL  TSH  Result Value Ref Range   TSH 2.000 0.450 - 4.500 uIU/mL  UA/M w/rflx Culture, Routine  Result Value Ref Range   Specific Gravity, UA 1.020 1.005 - 1.030   pH, UA  7.0 5.0 - 7.5   Color, UA Yellow Yellow   Appearance Ur Clear Clear   Leukocytes, UA Negative Negative   Protein, UA Negative Negative/Trace   Glucose, UA Negative Negative   Ketones, UA Negative Negative   RBC, UA Negative Negative   Bilirubin, UA Negative Negative   Urobilinogen, Ur 0.2 0.2 - 1.0 mg/dL   Nitrite, UA Negative Negative  Hepatitis C Antibody  Result Value Ref Range   Hep C Virus Ab <0.1 0.0 - 0.9 s/co ratio      Assessment & Plan:   Problem List Items Addressed This Visit      Digestive   GERD (gastroesophageal reflux disease)    Stable on current regimen. Continue current regimen. Continue to monitor.       Relevant Medications   RABEprazole (ACIPHEX) 20 MG tablet     Musculoskeletal and Integument   DDD (degenerative disc disease), cervical    Will get him into PT- referral given today and numbers of different places given. Will start him on gabapentin at night to try to help with his discomfort. Recheck 4-6 weeks.       Relevant Medications   meloxicam (MOBIC) 15 MG tablet     Other   Hyperlipidemia - Primary    Rechecking levels today. Await results. Continue current regimen. Call with any concerns.       Relevant Medications   simvastatin (ZOCOR) 40 MG tablet   Other Relevant Orders   Lipid Panel w/o Chol/HDL Ratio   Insomnia    Not doing great due to pain. Will start gabapentin. Continue trazodone. Call with any concerns.       Chronic right shoulder pain    This doesn't seem to be due to his DDD-- shoulder x-ray normal. Will get him into orthopedics and PT- referral generated today.       Relevant Medications    gabapentin (NEURONTIN) 100 MG capsule   traZODone (DESYREL) 50 MG tablet   meloxicam (MOBIC) 15 MG tablet   Other Relevant Orders   Ambulatory referral to Orthopedic Surgery       Follow up plan: Return 4-6 weeks , for follow up neck and shoulder.

## 2016-09-17 NOTE — Assessment & Plan Note (Signed)
This doesn't seem to be due to his DDD-- shoulder x-ray normal. Will get him into orthopedics and PT- referral generated today.

## 2016-09-17 NOTE — Assessment & Plan Note (Signed)
Will get him into PT- referral given today and numbers of different places given. Will start him on gabapentin at night to try to help with his discomfort. Recheck 4-6 weeks.

## 2016-09-17 NOTE — Assessment & Plan Note (Signed)
Not doing great due to pain. Will start gabapentin. Continue trazodone. Call with any concerns.

## 2016-09-17 NOTE — Assessment & Plan Note (Signed)
Stable on current regimen. Continue current regimen. Continue to monitor.  

## 2016-09-17 NOTE — Patient Instructions (Signed)
[facility] [address] [place], [place] [address] [phone]   [facility] [place], [place], [address] [phone]  [place], [place], [place] [address] [phone]  [place], [place], [place] [address] [phone]  [facility] [address] [place], [place], [place] [address] [phone]

## 2016-09-18 ENCOUNTER — Encounter: Payer: Self-pay | Admitting: Family Medicine

## 2016-09-18 LAB — LIPID PANEL W/O CHOL/HDL RATIO
Cholesterol, Total: 138 mg/dL (ref 100–199)
HDL: 32 mg/dL — ABNORMAL LOW (ref >39)
LDL Calculated: 59 mg/dL (ref 0–99)
Triglycerides: 233 mg/dL — ABNORMAL HIGH (ref 0–149)
VLDL Cholesterol Cal: 47 mg/dL — ABNORMAL HIGH (ref 5–40)

## 2016-10-14 DIAGNOSIS — Z6841 Body Mass Index (BMI) 40.0 and over, adult: Secondary | ICD-10-CM

## 2016-11-11 ENCOUNTER — Encounter: Payer: Self-pay | Admitting: Family Medicine

## 2016-11-11 ENCOUNTER — Ambulatory Visit (INDEPENDENT_AMBULATORY_CARE_PROVIDER_SITE_OTHER): Payer: BC Managed Care – PPO | Admitting: Family Medicine

## 2016-11-11 VITALS — BP 116/72 | HR 66 | Temp 97.7°F | Wt 287.4 lb

## 2016-11-11 DIAGNOSIS — R030 Elevated blood-pressure reading, without diagnosis of hypertension: Secondary | ICD-10-CM

## 2016-11-11 LAB — MICROALBUMIN, URINE WAIVED
Creatinine, Urine Waived: 50 mg/dL (ref 10–300)
Microalb, Ur Waived: 10 mg/L (ref 0–19)
Microalb/Creat Ratio: <30 mg/g (ref <30)

## 2016-11-11 NOTE — Progress Notes (Signed)
BP 116/72   Pulse 66   Temp 97.7 F (36.5 C)   Wt 287 lb 6.4 oz (130.4 kg)   SpO2 97%   BMI 40.65 kg/m    Subjective:    Patient ID: Curtis Bray., male    DOB: 1961-04-26, 56 y.o.   MRN: [id number]  HPI: Curtis Bray. is a 56 y.o. male  Chief Complaint  Patient presents with  . Hypertension    Patient states that he checked his BP a couple weeks ago at [employer], it was 160's/ 120's. Patient also states that his vision was affected.    ELEVATED BLOOD PRESSURE- BP was up for several weeks a couple of weeks ago. Notes that BP was high and his vision was blurred a bit. He notes that he was under a lot of stress.  Duration of elevated BP: about 2 weeks a couple of weeks ago BP monitoring frequency: a few times a week BP range: 160s/120s Previous BP meds: no Recent stressors: yes Family history of hypertension: yes Recurrent headaches: no Visual changes: yes Palpitations: no  Dyspnea: no Chest pain: no Lower extremity edema: no Dizzy/lightheaded: no Transient ischemic attacks: no  Relevant past medical, surgical, family and social history reviewed and updated as indicated. Interim medical history since our last visit reviewed. Allergies and medications reviewed and updated.  Review of Systems  Constitutional: Negative.   Eyes: Positive for visual disturbance. Negative for photophobia, pain, discharge, redness and itching.  Respiratory: Negative.   Cardiovascular: Negative.   Psychiatric/Behavioral: Negative.     Per HPI unless specifically indicated above     Objective:    BP 116/72   Pulse 66   Temp 97.7 F (36.5 C)   Wt 287 lb 6.4 oz (130.4 kg)   SpO2 97%   BMI 40.65 kg/m   Wt Readings from Last 3 Encounters:  11/11/16 287 lb 6.4 oz (130.4 kg)  09/17/16 289 lb 3.2 oz (131.2 kg)  08/07/16 292 lb (132.5 kg)    Physical Exam  Constitutional: He is oriented to person, place, and time. He appears well-developed and well-nourished. No distress.    HENT:  Head: Normocephalic and atraumatic.  Right Ear: Hearing normal.  Left Ear: Hearing normal.  Nose: Nose normal.  Eyes: Conjunctivae and lids are normal. Right eye exhibits no discharge. Left eye exhibits no discharge. No scleral icterus.  Cardiovascular: Normal rate, regular rhythm, normal heart sounds and intact distal pulses.  Exam reveals no gallop and no friction rub.   No murmur heard. Pulmonary/Chest: Effort normal and breath sounds normal. No respiratory distress. He has no wheezes. He has no rales. He exhibits no tenderness.  Musculoskeletal: Normal range of motion.  Neurological: He is alert and oriented to person, place, and time.  Skin: Skin is warm, dry and intact. No rash noted. He is not diaphoretic. No erythema. No pallor.  Psychiatric: He has a normal mood and affect. His speech is normal and behavior is normal. Judgment and thought content normal. Cognition and memory are normal.  Nursing note and vitals reviewed.   Results for orders placed or performed in visit on 09/17/16  Lipid Panel w/o Chol/HDL Ratio  Result Value Ref Range   Cholesterol, Total 138 100 - 199 mg/dL   Triglycerides 233 (H) 0 - 149 mg/dL   HDL 32 (L) >39 mg/dL   VLDL Cholesterol Cal 47 (H) 5 - 40 mg/dL   LDL Calculated 59 0 - 99 mg/dL  Assessment & Plan:   Problem List Items Addressed This Visit    None    Visit Diagnoses    Elevated blood pressure, situational    -  Primary   Will check BMP and microalbumin. Normal here. Continue to monitor. Work on [employer]. Call with any concerns.    Relevant Orders   Microalbumin, Urine Waived   Basic metabolic panel       Follow up plan: Return As scheduled.

## 2016-11-11 NOTE — Patient Instructions (Addendum)
DASH Eating Plan DASH stands for "Dietary Approaches to Stop Hypertension." The DASH eating plan is a healthy eating plan that has been shown to reduce high blood pressure (hypertension). It may also reduce your risk for type 2 diabetes, heart disease, and stroke. The DASH eating plan may also help with weight loss. What are tips for following this plan? General guidelines  Avoid eating more than 2,300 mg (milligrams) of salt (sodium) a day. If you have hypertension, you may need to reduce your sodium intake to 1,500 mg a day.  Limit alcohol intake to no more than 1 drink a day for nonpregnant women and 2 drinks a day for men. One drink equals 12 oz of beer, 5 oz of wine, or 1 oz of hard liquor.  Work with your health care provider to maintain a healthy body weight or to lose weight. Ask what an ideal weight is for you.  Get at least 30 minutes of exercise that causes your heart to beat faster (aerobic exercise) most days of the week. Activities may include walking, swimming, or biking.  Work with your health care provider or diet and nutrition specialist (dietitian) to adjust your eating plan to your individual calorie needs. Reading food labels  Check food labels for the amount of sodium per serving. Choose foods with less than 5 percent of the Daily Value of sodium. Generally, foods with less than 300 mg of sodium per serving fit into this eating plan.  To find whole grains, look for the word "whole" as the first word in the ingredient list. Shopping  Buy products labeled as "low-sodium" or "no salt added."  Buy fresh foods. Avoid canned foods and premade or frozen meals. Cooking  Avoid adding salt when cooking. Use salt-free seasonings or herbs instead of table salt or sea salt. Check with your health care provider or pharmacist before using salt substitutes.  Do not fry foods. Cook foods using healthy methods such as baking, boiling, grilling, and broiling instead.  Cook with  heart-healthy oils, such as olive, canola, soybean, or sunflower oil. Meal planning   Eat a balanced diet that includes: ? 5 or more servings of fruits and vegetables each day. At each meal, try to fill half of your plate with fruits and vegetables. ? Up to 6-8 servings of whole grains each day. ? Less than 6 oz of lean meat, poultry, or fish each day. A 3-oz serving of meat is about the same size as a deck of cards. One egg equals 1 oz. ? 2 servings of low-fat dairy each day. ? A serving of nuts, seeds, or beans 5 times each week. ? Heart-healthy fats. Healthy fats called Omega-3 fatty acids are found in foods such as flaxseeds and coldwater fish, like sardines, salmon, and mackerel.  Limit how much you eat of the following: ? Canned or prepackaged foods. ? Food that is high in trans fat, such as fried foods. ? Food that is high in saturated fat, such as fatty meat. ? Sweets, desserts, sugary drinks, and other foods with added sugar. ? Full-fat dairy products.  Do not salt foods before eating.  Try to eat at least 2 vegetarian meals each week.  Eat more home-cooked food and less restaurant, buffet, and fast food.  When eating at a restaurant, ask that your food be prepared with less salt or no salt, if possible. What foods are recommended? The items listed may not be a complete list. Talk with your dietitian about what   dietary choices are best for you. Grains Whole-grain or whole-wheat bread. Whole-grain or whole-wheat pasta. Brown rice. Oatmeal. Quinoa. Bulgur. Whole-grain and low-sodium cereals. Pita bread. Low-fat, low-sodium crackers. Whole-wheat flour tortillas. Vegetables Fresh or frozen vegetables (raw, steamed, roasted, or grilled). Low-sodium or reduced-sodium tomato and vegetable juice. Low-sodium or reduced-sodium tomato sauce and tomato paste. Low-sodium or reduced-sodium canned vegetables. Fruits All fresh, dried, or frozen fruit. Canned fruit in natural juice (without  added sugar). Meat and other protein foods Skinless chicken or turkey. Ground chicken or turkey. Pork with fat trimmed off. Fish and seafood. Egg whites. Dried beans, peas, or lentils. Unsalted nuts, nut butters, and seeds. Unsalted canned beans. Lean cuts of beef with fat trimmed off. Low-sodium, lean deli meat. Dairy Low-fat (1%) or fat-free (skim) milk. Fat-free, low-fat, or reduced-fat cheeses. Nonfat, low-sodium ricotta or cottage cheese. Low-fat or nonfat yogurt. Low-fat, low-sodium cheese. Fats and oils Soft margarine without trans fats. Vegetable oil. Low-fat, reduced-fat, or light mayonnaise and salad dressings (reduced-sodium). Canola, safflower, olive, soybean, and sunflower oils. Avocado. Seasoning and other foods Herbs. Spices. Seasoning mixes without salt. Unsalted popcorn and pretzels. Fat-free sweets. What foods are not recommended? The items listed may not be a complete list. Talk with your dietitian about what dietary choices are best for you. Grains Baked goods made with fat, such as croissants, muffins, or some breads. Dry pasta or rice meal packs. Vegetables Creamed or fried vegetables. Vegetables in a cheese sauce. Regular canned vegetables (not low-sodium or reduced-sodium). Regular canned tomato sauce and paste (not low-sodium or reduced-sodium). Regular tomato and vegetable juice (not low-sodium or reduced-sodium). Pickles. Olives. Fruits Canned fruit in a light or heavy syrup. Fried fruit. Fruit in cream or butter sauce. Meat and other protein foods Fatty cuts of meat. Ribs. Fried meat. Bacon. Sausage. Bologna and other processed lunch meats. Salami. Fatback. Hotdogs. Bratwurst. Salted nuts and seeds. Canned beans with added salt. Canned or smoked fish. Whole eggs or egg yolks. Chicken or turkey with skin. Dairy Whole or 2% milk, cream, and half-and-half. Whole or full-fat cream cheese. Whole-fat or sweetened yogurt. Full-fat cheese. Nondairy creamers. Whipped toppings.  Processed cheese and cheese spreads. Fats and oils Butter. Stick margarine. Lard. Shortening. Ghee. Bacon fat. Tropical oils, such as coconut, palm kernel, or palm oil. Seasoning and other foods Salted popcorn and pretzels. Onion salt, garlic salt, seasoned salt, table salt, and sea salt. Worcestershire sauce. Tartar sauce. Barbecue sauce. Teriyaki sauce. Soy sauce, including reduced-sodium. Steak sauce. Canned and packaged gravies. Fish sauce. Oyster sauce. Cocktail sauce. Horseradish that you find on the shelf. Ketchup. Mustard. Meat flavorings and tenderizers. Bouillon cubes. Hot sauce and Tabasco sauce. Premade or packaged marinades. Premade or packaged taco seasonings. Relishes. Regular salad dressings. Where to find more information:  National Heart, Lung, and Blood Institute: www.nhlbi.nih.gov  American Heart Association: www.heart.org Summary  The DASH eating plan is a healthy eating plan that has been shown to reduce high blood pressure (hypertension). It may also reduce your risk for type 2 diabetes, heart disease, and stroke.  With the DASH eating plan, you should limit salt (sodium) intake to 2,300 mg a day. If you have hypertension, you may need to reduce your sodium intake to 1,500 mg a day.  When on the DASH eating plan, aim to eat more fresh fruits and vegetables, whole grains, lean proteins, low-fat dairy, and heart-healthy fats.  Work with your health care provider or diet and nutrition specialist (dietitian) to adjust your eating plan to your individual   calorie needs. This information is not intended to replace advice given to you by your health care provider. Make sure you discuss any questions you have with your health care provider. Document Released: 05/01/2011 Document Revised: 05/05/2016 Document Reviewed: 05/05/2016 Elsevier Interactive Patient Education  2017 [employer]  

## 2016-11-12 ENCOUNTER — Encounter: Payer: Self-pay | Admitting: Family Medicine

## 2016-11-12 LAB — BASIC METABOLIC PANEL
BUN / CREAT RATIO: 18 (ref 9–20)
BUN: 19 mg/dL (ref 6–24)
CO2: 23 mmol/L (ref 20–29)
CREATININE: 1.08 mg/dL (ref 0.76–1.27)
Calcium: 9.5 mg/dL (ref 8.7–10.2)
Chloride: 102 mmol/L (ref 96–106)
GFR calc Af Amer: 89 mL/min/{1.73_m2} (ref >59)
GFR, EST NON AFRICAN AMERICAN: 77 mL/min/{1.73_m2} (ref >59)
Glucose: 87 mg/dL (ref 65–99)
Potassium: 4.3 mmol/L (ref 3.5–5.2)
SODIUM: 139 mmol/L (ref 134–144)

## 2016-12-09 ENCOUNTER — Ambulatory Visit (INDEPENDENT_AMBULATORY_CARE_PROVIDER_SITE_OTHER): Payer: BC Managed Care – PPO | Admitting: Family Medicine

## 2016-12-09 ENCOUNTER — Encounter: Payer: Self-pay | Admitting: Family Medicine

## 2016-12-09 VITALS — BP 126/73 | HR 71 | Temp 98.4°F | Wt 285.2 lb

## 2016-12-09 DIAGNOSIS — K219 Gastro-esophageal reflux disease without esophagitis: Secondary | ICD-10-CM

## 2016-12-09 DIAGNOSIS — R202 Paresthesia of skin: Secondary | ICD-10-CM | POA: Diagnosis not present

## 2016-12-09 DIAGNOSIS — R200 Anesthesia of skin: Secondary | ICD-10-CM | POA: Diagnosis not present

## 2016-12-09 DIAGNOSIS — E782 Mixed hyperlipidemia: Secondary | ICD-10-CM | POA: Diagnosis not present

## 2016-12-09 DIAGNOSIS — F5101 Primary insomnia: Secondary | ICD-10-CM

## 2016-12-09 MED ORDER — SIMVASTATIN 40 MG PO TABS: 40.0000 mg | ORAL_TABLET | Freq: Every day | ORAL | 1 refills | Status: DC

## 2016-12-09 MED ORDER — GABAPENTIN 300 MG PO CAPS: 300.0000 mg | ORAL_CAPSULE | Freq: Every day | ORAL | 1 refills | Status: DC

## 2016-12-09 MED ORDER — ASPIRIN 81 MG PO TABS: 81.0000 mg | ORAL_TABLET | Freq: Every day | ORAL | 4 refills | Status: DC

## 2016-12-09 MED ORDER — TRAZODONE HCL 50 MG PO TABS: 100.0000 mg | ORAL_TABLET | Freq: Every day | ORAL | 1 refills | Status: DC

## 2016-12-09 MED ORDER — RABEPRAZOLE SODIUM 20 MG PO TBEC: 40.0000 mg | DELAYED_RELEASE_TABLET | Freq: Every day | ORAL | 1 refills | Status: DC

## 2016-12-09 MED ORDER — MELOXICAM 15 MG PO TABS: 15.0000 mg | ORAL_TABLET | Freq: Every day | ORAL | 1 refills | Status: DC

## 2016-12-09 NOTE — Progress Notes (Signed)
BP 126/73 (BP Location: Left Arm, Patient Position: Sitting, Cuff Size: Large)   Pulse 71   Temp 98.4 F (36.9 C)   Wt 285 lb 4 oz (129.4 kg)   SpO2 96%   BMI 40.35 kg/m    Subjective:    Patient ID: Curtis Ran., male    DOB: January 23, 1961, 56 y.o.   MRN: [id number]  HPI: Curtis Mario. is a 56 y.o. male  No chief complaint on file.  INSOMNIA Duration: chronic Satisfied with sleep quality: yes Difficulty falling asleep: no Difficulty staying asleep: no Waking a few hours after sleep onset: no Early morning awakenings: no Daytime hypersomnolence: no Wakes feeling refreshed: yes Good sleep hygiene: yes Apnea: no Snoring: no Depressed/anxious mood: no Recent stress: no Restless legs/nocturnal leg cramps: no Chronic pain/arthritis: no History of sleep study: yes Treatments attempted: trazodone- does well with it, melatonin, uinsom and benadryl   HYPERLIPIDEMIA Hyperlipidemia status: excellent compliance Satisfied with current treatment?  yes Side effects:  no Medication compliance: excellent compliance Past cholesterol meds: simvastatin Supplements: none Aspirin:  yes The 10-year ASCVD risk score [name], et al., 2013) is: 5.3%   Values used to calculate the score:     Age: 76 years     Sex: Male     Is Non-Hispanic African American: No     Diabetic: No     Tobacco smoker: No     Systolic Blood Pressure: 627 mmHg     Is BP treated: No     HDL Cholesterol: 32 mg/dL     Total Cholesterol: 138 mg/dL Chest pain:  no Coronary artery disease:  no Family history CAD:  yes  GERD GERD control status: controlled  Satisfied with current treatment? yes Heartburn frequency: rarely on medicine Medication side effects: no  Medication compliance: excellent Dysphagia: no Odynophagia:  no Hematemesis: no Blood in stool: no EGD: no  Relevant past medical, surgical, family and social history reviewed and updated as indicated. Interim medical history  since our last visit reviewed. Allergies and medications reviewed and updated.  Review of Systems  Constitutional: Negative.   Respiratory: Negative.   Cardiovascular: Negative.   Musculoskeletal: Negative.   Neurological: Positive for numbness (R thumb- no change).  Psychiatric/Behavioral: Negative.     Per HPI unless specifically indicated above     Objective:    BP 126/73 (BP Location: Left Arm, Patient Position: Sitting, Cuff Size: Large)   Pulse 71   Temp 98.4 F (36.9 C)   Wt 285 lb 4 oz (129.4 kg)   SpO2 96%   BMI 40.35 kg/m   Wt Readings from Last 3 Encounters:  12/09/16 285 lb 4 oz (129.4 kg)  11/11/16 287 lb 6.4 oz (130.4 kg)  09/17/16 289 lb 3.2 oz (131.2 kg)    Physical Exam  Constitutional: He is oriented to person, place, and time. He appears well-developed and well-nourished. No distress.  HENT:  Head: Normocephalic and atraumatic.  Right Ear: Hearing normal.  Left Ear: Hearing normal.  Nose: Nose normal.  Eyes: Conjunctivae and lids are normal. Right eye exhibits no discharge. Left eye exhibits no discharge. No scleral icterus.  Cardiovascular: Normal rate, regular rhythm, normal heart sounds and intact distal pulses.  Exam reveals no gallop and no friction rub.   No murmur heard. Pulmonary/Chest: Effort normal and breath sounds normal. No respiratory distress. He has no wheezes. He has no rales. He exhibits no tenderness.  Musculoskeletal: Normal range of motion.  Neurological: He is alert and oriented to person, place, and time.  Skin: Skin is warm, dry and intact. No rash noted. He is not diaphoretic. No erythema. No pallor.  Psychiatric: He has a normal mood and affect. His speech is normal and behavior is normal. Judgment and thought content normal. Cognition and memory are normal.  Nursing note and vitals reviewed.   Results for orders placed or performed in visit on 11/11/16  Microalbumin, Urine Waived  Result Value Ref Range   Microalb, Ur  Waived 10 0 - 19 mg/L   Creatinine, Urine Waived 50 10 - 300 mg/dL   Microalb/Creat Ratio <30 <30 mg/g  Basic metabolic panel  Result Value Ref Range   Glucose 87 65 - 99 mg/dL   BUN 19 6 - 24 mg/dL   Creatinine, Ser 1.08 0.76 - 1.27 mg/dL   GFR calc non Af Amer 77 >59 mL/min/1.73   GFR calc Af Amer 89 >59 mL/min/1.73   BUN/Creatinine Ratio 18 9 - 20   Sodium 139 134 - 144 mmol/L   Potassium 4.3 3.5 - 5.2 mmol/L   Chloride 102 96 - 106 mmol/L   CO2 23 20 - 29 mmol/L   Calcium 9.5 8.7 - 10.2 mg/dL      Assessment & Plan:   Problem List Items Addressed This Visit      Digestive   GERD (gastroesophageal reflux disease)    Under good control on current regimen. Continue to monitor. Continue current regimen. Refills given today.      Relevant Medications   RABEprazole (ACIPHEX) 20 MG tablet   Other Relevant Orders   CBC with Differential/Platelet   Comprehensive metabolic panel     Other   Hyperlipidemia - Primary    Under good control on current regimen. Continue to monitor. Continue current regimen. Refills given today.      Relevant Medications   simvastatin (ZOCOR) 40 MG tablet   aspirin 81 MG tablet   Other Relevant Orders   Comprehensive metabolic panel   Lipid Panel w/o Chol/HDL Ratio   Numbness and tingling of right thumb    Seeing chiropractor. Continue to monitor.       Insomnia    Under good control on current regimen. Continue to monitor. Continue current regimen. Refills given today.          Follow up plan: Return in about 6 months (around 06/11/2017) for Physical .

## 2016-12-09 NOTE — Assessment & Plan Note (Signed)
Under good control on current regimen. Continue to monitor. Continue current regimen. Refills given today.

## 2016-12-09 NOTE — Assessment & Plan Note (Signed)
Seeing chiropractor. Continue to monitor.

## 2016-12-10 ENCOUNTER — Encounter: Payer: Self-pay | Admitting: Family Medicine

## 2016-12-10 LAB — COMPREHENSIVE METABOLIC PANEL
ALBUMIN: 4 g/dL (ref 3.5–5.5)
ALT: 31 IU/L (ref 0–44)
AST: 21 IU/L (ref 0–40)
Albumin/Globulin Ratio: 1.8 (ref 1.2–2.2)
Alkaline Phosphatase: 61 IU/L (ref 39–117)
BILIRUBIN TOTAL: 0.7 mg/dL (ref 0.0–1.2)
BUN/Creatinine Ratio: 12 (ref 9–20)
BUN: 12 mg/dL (ref 6–24)
CHLORIDE: 102 mmol/L (ref 96–106)
CO2: 22 mmol/L (ref 20–29)
CREATININE: 1.02 mg/dL (ref 0.76–1.27)
Calcium: 9.1 mg/dL (ref 8.7–10.2)
GFR calc non Af Amer: 82 mL/min/{1.73_m2} (ref >59)
GFR, EST AFRICAN AMERICAN: 95 mL/min/{1.73_m2} (ref >59)
GLOBULIN, TOTAL: 2.2 g/dL (ref 1.5–4.5)
GLUCOSE: 113 mg/dL — AB (ref 65–99)
Potassium: 4.1 mmol/L (ref 3.5–5.2)
Sodium: 141 mmol/L (ref 134–144)
TOTAL PROTEIN: 6.2 g/dL (ref 6.0–8.5)

## 2016-12-10 LAB — CBC WITH DIFFERENTIAL/PLATELET
BASOS ABS: 0 10*3/uL (ref 0.0–0.2)
BASOS: 1 %
EOS (ABSOLUTE): 0.2 10*3/uL (ref 0.0–0.4)
EOS: 3 %
HEMATOCRIT: 46.4 % (ref 37.5–51.0)
HEMOGLOBIN: 15.2 g/dL (ref 13.0–17.7)
IMMATURE GRANS (ABS): 0 10*3/uL (ref 0.0–0.1)
Immature Granulocytes: 0 %
LYMPHS: 35 %
Lymphocytes Absolute: 2.3 10*3/uL (ref 0.7–3.1)
MCH: 29.7 pg (ref 26.6–33.0)
MCHC: 32.8 g/dL (ref 31.5–35.7)
MCV: 91 fL (ref 79–97)
MONOCYTES: 6 %
Monocytes Absolute: 0.4 10*3/uL (ref 0.1–0.9)
NEUTROS ABS: 3.6 10*3/uL (ref 1.4–7.0)
Neutrophils: 55 %
Platelets: 179 10*3/uL (ref 150–379)
RBC: 5.11 x10E6/uL (ref 4.14–5.80)
RDW: 13.6 % (ref 12.3–15.4)
WBC: 6.5 10*3/uL (ref 3.4–10.8)

## 2016-12-10 LAB — LIPID PANEL W/O CHOL/HDL RATIO
Cholesterol, Total: 132 mg/dL (ref 100–199)
HDL: 29 mg/dL — ABNORMAL LOW (ref >39)
Triglycerides: 503 mg/dL — ABNORMAL HIGH (ref 0–149)

## 2017-03-03 ENCOUNTER — Other Ambulatory Visit: Payer: Self-pay | Admitting: Family Medicine

## 2017-03-05 DIAGNOSIS — M542 Cervicalgia: Secondary | ICD-10-CM | POA: Insufficient documentation

## 2017-03-20 DIAGNOSIS — R911 Solitary pulmonary nodule: Secondary | ICD-10-CM | POA: Insufficient documentation

## 2017-03-23 ENCOUNTER — Telehealth: Payer: Self-pay | Admitting: Family Medicine

## 2017-03-23 NOTE — Telephone Encounter (Signed)
Note from hospital reviewed. CT of neck showed concern for mass in lung- needs dedicated Chest CT.

## 2017-03-23 NOTE — Telephone Encounter (Signed)
Called patient and LMOM. CRM generated.

## 2017-03-24 ENCOUNTER — Telehealth: Payer: Self-pay

## 2017-03-24 ENCOUNTER — Ambulatory Visit (INDEPENDENT_AMBULATORY_CARE_PROVIDER_SITE_OTHER): Payer: BC Managed Care – PPO | Admitting: Family Medicine

## 2017-03-24 ENCOUNTER — Encounter: Payer: Self-pay | Admitting: Family Medicine

## 2017-03-24 ENCOUNTER — Telehealth: Payer: Self-pay | Admitting: Family Medicine

## 2017-03-24 VITALS — Ht 70.7 in | Wt 279.3 lb

## 2017-03-24 DIAGNOSIS — G8929 Other chronic pain: Secondary | ICD-10-CM | POA: Diagnosis not present

## 2017-03-24 DIAGNOSIS — E782 Mixed hyperlipidemia: Secondary | ICD-10-CM

## 2017-03-24 DIAGNOSIS — R29898 Other symptoms and signs involving the musculoskeletal system: Secondary | ICD-10-CM

## 2017-03-24 DIAGNOSIS — M25511 Pain in right shoulder: Secondary | ICD-10-CM

## 2017-03-24 DIAGNOSIS — G4733 Obstructive sleep apnea (adult) (pediatric): Secondary | ICD-10-CM

## 2017-03-24 DIAGNOSIS — M5030 Other cervical disc degeneration, unspecified cervical region: Secondary | ICD-10-CM

## 2017-03-24 DIAGNOSIS — K219 Gastro-esophageal reflux disease without esophagitis: Secondary | ICD-10-CM

## 2017-03-24 DIAGNOSIS — Z0000 Encounter for general adult medical examination without abnormal findings: Secondary | ICD-10-CM | POA: Diagnosis not present

## 2017-03-24 DIAGNOSIS — R059 Cough, unspecified: Secondary | ICD-10-CM

## 2017-03-24 DIAGNOSIS — R05 Cough: Secondary | ICD-10-CM

## 2017-03-24 DIAGNOSIS — R918 Other nonspecific abnormal finding of lung field: Secondary | ICD-10-CM | POA: Diagnosis not present

## 2017-03-24 MED ORDER — TRAZODONE HCL 50 MG PO TABS: 100.0000 mg | ORAL_TABLET | Freq: Every day | ORAL | 1 refills | Status: DC

## 2017-03-24 MED ORDER — RABEPRAZOLE SODIUM 20 MG PO TBEC: 20.0000 mg | DELAYED_RELEASE_TABLET | Freq: Every day | ORAL | 1 refills | Status: DC

## 2017-03-24 MED ORDER — MELOXICAM 15 MG PO TABS: 15.0000 mg | ORAL_TABLET | Freq: Every day | ORAL | 1 refills | Status: DC

## 2017-03-24 MED ORDER — GABAPENTIN 300 MG PO CAPS: 300.0000 mg | ORAL_CAPSULE | Freq: Every day | ORAL | 1 refills | Status: DC

## 2017-03-24 MED ORDER — SIMVASTATIN 40 MG PO TABS: 40.0000 mg | ORAL_TABLET | Freq: Every day | ORAL | 1 refills | Status: DC

## 2017-03-24 NOTE — Assessment & Plan Note (Signed)
Stable on current regimen. Continue current regimen. Continue to monitor. Call with any concerns.  

## 2017-03-24 NOTE — Patient Instructions (Addendum)

## 2017-03-24 NOTE — Telephone Encounter (Signed)
Copied from [place] [phone]. Topic: Quick Communication - See Telephone Encounter >> Mar 24, 2017  3:52 PM Boyd Kerbs wrote: CRM for notification. See Telephone encounter for:  03/24/17. Larene Beach from [employer] called regarding sleep study fax she got today. She needs something faxed [phone]  to her with why the dr. Is having study done and she also needs Insurance information

## 2017-03-24 NOTE — Telephone Encounter (Signed)
Called patient and left a VM on patient's personalized voicemail for his appointment w/ radiology (CT & MRI).  Wednesday 04/01/2017 be there at 11:45AM at [facility] Entrance.  Explained to patient if he had any other questions regarding this appointment to call. [place] for CRM to give information if patient calls.

## 2017-03-24 NOTE — Assessment & Plan Note (Signed)
Doesn't seem to be due to his DDD-shoulder x-ray normal in April. Has done PT without benefit. Now with some atrophy. Time for an MRI. Ordered today.

## 2017-03-24 NOTE — Assessment & Plan Note (Signed)
Has not had a sleep study in about 15 years. Unsure if his levels are right. Would like to get back in to make sure. Referral generated today.

## 2017-03-24 NOTE — Assessment & Plan Note (Signed)
Severe and really bothering him. Following with neurology, physiatry and orthopedics. Thinks that he may have to have surgery. Continue to follow with specialists. Call with any concerns.

## 2017-03-24 NOTE — Progress Notes (Signed)
Ht 5' 10.7" (1.796 m)   Wt 279 lb 5 oz (126.7 kg)   BMI 39.29 kg/m    Subjective:    Patient ID: Curtis Ran., male    DOB: [birth year], 56 y.o.   MRN: [id number]  HPI: Curtis Bray. is a 56 y.o. male presenting on 03/24/2017 for comprehensive medical examination. Current medical complaints include:  HYPERLIPIDEMIA Hyperlipidemia status: excellent compliance Satisfied with current treatment?  yes Side effects:  no Medication compliance: excellent compliance Past cholesterol meds: simvastatin (zocor) Supplements: none Aspirin:  yes The 10-year ASCVD risk score [name], et al., 2013) is: 6%   Values used to calculate the score:     Age: 82 years     Sex: Male     Is Non-Hispanic African American: No     Diabetic: No     Tobacco smoker: No     Systolic Blood Pressure: 081 mmHg     Is BP treated: No     HDL Cholesterol: 29 mg/dL     Total Cholesterol: 132 mg/dL Chest pain:  no Coronary artery disease:  no Family history CAD:  yes  Shoulder still really bothering him. Has seen neurology and orthopedics. Cannot lift his arm above 90 degrees. Feels weak. No better with PT. In a lot of pain. Wakes him up at night. Feeling miserable.   Has not had sleep study in 15 years. Unsure if his levels are right. Feeling really tired. Would like a repeat sleep study.  Interim Problems from his last visit: yes  Depression Screen done today and results listed below:  Depression screen [facility] 2/9 03/24/2017 03/18/2016 03/14/2015  Decreased Interest 0 0 0  Down, Depressed, Hopeless 0 0 1  PHQ - 2 Score 0 0 1  Altered sleeping - - 0  Tired, decreased energy - - 0  Change in appetite - - 1  Feeling bad or failure about yourself  - - 0  Trouble concentrating - - 0  Moving slowly or fidgety/restless - - 0  Suicidal thoughts - - 0  PHQ-9 Score - - 2  Difficult doing work/chores - - Not difficult at all    Past Medical History:  Past Medical History:  Diagnosis Date  .  Allergy   . Carpal tunnel syndrome   . DDD (degenerative disc disease), cervical   . Edema   . GERD (gastroesophageal reflux disease)   . Hyperlipidemia   . Personal history of colonic polyps   . Sleep apnea    Bi-pap  . Sleep apnea   . TIA (transient ischemic attack) 07/2011  . Vision impairment    Blindness Right Eye    Surgical History:  Past Surgical History:  Procedure Laterality Date  . APPENDECTOMY    . carpel tunnel Bilateral   . COLONOSCOPY  2015   Found 2 beign polyps, repeat 10 years  . TONSILLECTOMY      Medications:  Current Outpatient Prescriptions on File Prior to Visit  Medication Sig  . aspirin 81 MG tablet Take 1 tablet (81 mg total) by mouth daily.  . Cholecalciferol (VITAMIN D-1000 MAX ST) 1000 units tablet Take by mouth.  . Coenzyme Q10 (CO Q 10) 10 MG CAPS Take by mouth.  . Cyanocobalamin (RA VITAMIN B-12 TR) 1000 MCG TBCR Take by mouth.  Marland Kitchen OVER THE COUNTER MEDICATION   . triamcinolone cream (KENALOG) 0.1 % Apply 1 application topically 2 (two) times daily. (Patient not taking: Reported on 09/17/2016)  .  vitamin E 100 UNIT capsule Take by mouth daily.   No current facility-administered medications on file prior to visit.     Allergies:  No Known Allergies  Social History:  Social History   Social History  . Marital status: Married    Spouse name: N/A  . Number of children: N/A  . Years of education: N/A   Occupational History  . Not on file.   Social History Main Topics  . Smoking status: Former Smoker    Years: 18.00    Quit date: 05/27/1995  . Smokeless tobacco: Never Used  . Alcohol use 0.0 oz/week     Comment: Rare  . Drug use: No  . Sexual activity: Yes   Other Topics Concern  . Not on file   Social History Narrative  . No narrative on file   History  Smoking Status  . Former Smoker  . Years: 18.00  . Quit date: 05/27/1995  Smokeless Tobacco  . Never Used   History  Alcohol Use  . 0.0 oz/week    Comment: Rare     Family History:  Family History  Problem Relation Age of Onset  . Hearing loss Father   . Hypertension Father   . Asthma Son   . Cancer Maternal Grandmother        Colon    Past medical history, surgical history, medications, allergies, family history and social history reviewed with patient today and changes made to appropriate areas of the chart.   Review of Systems  Constitutional: Negative.   HENT: Positive for congestion and tinnitus. Negative for ear discharge, ear pain, hearing loss, nosebleeds, sinus pain and sore throat.   Eyes: Negative.   Respiratory: Positive for cough. Negative for hemoptysis, sputum production, shortness of breath, wheezing and stridor.   Cardiovascular: Negative.   Gastrointestinal: Negative.   Genitourinary: Negative.   Musculoskeletal: Positive for joint pain, myalgias and neck pain. Negative for back pain and falls.  Skin: Negative.   Neurological: Positive for tingling. Negative for dizziness, tremors, sensory change, speech change, focal weakness, seizures, loss of consciousness and headaches.  Endo/Heme/Allergies: Negative.   Psychiatric/Behavioral: Negative for depression, hallucinations, memory loss, substance abuse and suicidal ideas. The patient is nervous/anxious. The patient does not have insomnia.     All other ROS negative except what is listed above and in the HPI.      Objective:    Ht 5' 10.7" (1.796 m)   Wt 279 lb 5 oz (126.7 kg)   BMI 39.29 kg/m   Wt Readings from Last 3 Encounters:  03/24/17 279 lb 5 oz (126.7 kg)  12/09/16 285 lb 4 oz (129.4 kg)  11/11/16 287 lb 6.4 oz (130.4 kg)    Physical Exam  Constitutional: He is oriented to person, place, and time. He appears well-developed and well-nourished. No distress.  HENT:  Head: Normocephalic and atraumatic.  Right Ear: Hearing, tympanic membrane, external ear and ear canal normal.  Left Ear: Hearing, tympanic membrane, external ear and ear canal normal.  Nose:  Nose normal.  Mouth/Throat: Uvula is midline, oropharynx is clear and moist and mucous membranes are normal. No oropharyngeal exudate.  Eyes: Pupils are equal, round, and reactive to light. Conjunctivae, EOM and lids are normal. Right eye exhibits no discharge. Left eye exhibits no discharge. No scleral icterus.  Neck: Normal range of motion. Neck supple. No JVD present. No tracheal deviation present. No thyromegaly present.  Cardiovascular: Normal rate, regular rhythm, normal heart sounds and intact distal  pulses.  Exam reveals no gallop and no friction rub.   No murmur heard. Pulmonary/Chest: Effort normal and breath sounds normal. No stridor. No respiratory distress. He has no wheezes. He has no rales. He exhibits no tenderness.  Abdominal: Soft. Bowel sounds are normal. He exhibits no distension and no mass. There is no tenderness. There is no rebound and no guarding. Hernia confirmed negative in the right inguinal area and confirmed negative in the left inguinal area.  Genitourinary: Testes normal and penis normal. Right testis shows no mass, no swelling and no tenderness. Right testis is descended. Cremasteric reflex is not absent on the right side. Left testis shows no mass, no swelling and no tenderness. Left testis is descended. Cremasteric reflex is not absent on the left side. Circumcised. No phimosis, paraphimosis, hypospadias, penile erythema or penile tenderness. No discharge found.  Musculoskeletal: He exhibits tenderness. He exhibits no edema or deformity.     Shoulder: right    Inspection:  no swelling, ecchymosis, erythema or step off deformity. Some atrophy of the biceps Tenderness to Palpation:    Acromion: no    AC joint:yes    Clavicle: no    Bicipital groove: yes    Scapular spine: no    Coracoid process: yes    Humeral head: no    Supraspinatus tendon: yes     Range of Motion:     Abduction:Decreased    Adduction: Decreased    Flexion: Decreased    Extension:  Decreased    Internal rotation: Decreased    External rotation: Decreased    Painful arc: yes     Muscle Strength: 5/5 bilaterally    Neuro: Sensation WNL. and Upper extremity reflexes WNL.     Special Tests:     Neer sign: Negative    Hawkins sign: Positive    Cross arm adduction: Positive    Yergason sign: Negative    O'brien sign: Positive     Speed sign: Positive   Lymphadenopathy:    He has no cervical adenopathy.  Neurological: He is alert and oriented to person, place, and time. He has normal reflexes. He displays normal reflexes. No cranial nerve deficit. He exhibits normal muscle tone. Coordination normal.  Skin: Skin is warm, dry and intact. No rash noted. He is not diaphoretic. No erythema. No pallor.  Psychiatric: He has a normal mood and affect. His speech is normal and behavior is normal. Judgment and thought content normal. Cognition and memory are normal.  Nursing note and vitals reviewed.   Results for orders placed or performed in visit on 12/09/16  CBC with Differential/Platelet  Result Value Ref Range   WBC 6.5 3.4 - 10.8 x10E3/uL   RBC 5.11 4.14 - 5.80 x10E6/uL   Hemoglobin 15.2 13.0 - 17.7 g/dL   Hematocrit 46.4 37.5 - 51.0 %   MCV 91 79 - 97 fL   MCH 29.7 26.6 - 33.0 pg   MCHC 32.8 31.5 - 35.7 g/dL   RDW 13.6 12.3 - 15.4 %   Platelets 179 150 - 379 x10E3/uL   Neutrophils 55 Not Estab. %   Lymphs 35 Not Estab. %   Monocytes 6 Not Estab. %   Eos 3 Not Estab. %   Basos 1 Not Estab. %   Neutrophils Absolute 3.6 1.4 - 7.0 x10E3/uL   Lymphocytes Absolute 2.3 0.7 - 3.1 x10E3/uL   Monocytes Absolute 0.4 0.1 - 0.9 x10E3/uL   EOS (ABSOLUTE) 0.2 0.0 - 0.4 x10E3/uL   Basophils  Absolute 0.0 0.0 - 0.2 x10E3/uL   Immature Granulocytes 0 Not Estab. %   Immature Grans (Abs) 0.0 0.0 - 0.1 x10E3/uL  Comprehensive metabolic panel  Result Value Ref Range   Glucose 113 (H) 65 - 99 mg/dL   BUN 12 6 - 24 mg/dL   Creatinine, Ser 1.02 0.76 - 1.27 mg/dL   GFR calc non  Af Amer 82 >59 mL/min/1.73   GFR calc Af Amer 95 >59 mL/min/1.73   BUN/Creatinine Ratio 12 9 - 20   Sodium 141 134 - 144 mmol/L   Potassium 4.1 3.5 - 5.2 mmol/L   Chloride 102 96 - 106 mmol/L   CO2 22 20 - 29 mmol/L   Calcium 9.1 8.7 - 10.2 mg/dL   Total Protein 6.2 6.0 - 8.5 g/dL   Albumin 4.0 3.5 - 5.5 g/dL   Globulin, Total 2.2 1.5 - 4.5 g/dL   Albumin/Globulin Ratio 1.8 1.2 - 2.2   Bilirubin Total 0.7 0.0 - 1.2 mg/dL   Alkaline Phosphatase 61 39 - 117 IU/L   AST 21 0 - 40 IU/L   ALT 31 0 - 44 IU/L  Lipid Panel w/o Chol/HDL Ratio  Result Value Ref Range   Cholesterol, Total 132 100 - 199 mg/dL   Triglycerides 503 (H) 0 - 149 mg/dL   HDL 29 (L) >39 mg/dL   VLDL Cholesterol Cal Comment 5 - 40 mg/dL   LDL Calculated Comment 0 - 99 mg/dL      Assessment & Plan:   Problem List Items Addressed This Visit      Respiratory   Sleep apnea    Has not had a sleep study in about 15 years. Unsure if his levels are right. Would like to get back in to make sure. Referral generated today.       Relevant Orders   Ambulatory referral to Sleep Studies     Digestive   GERD (gastroesophageal reflux disease)    Stable on current regimen. Continue current regimen. Continue to monitor. Call with any concerns.       Relevant Medications   RABEprazole (ACIPHEX) 20 MG tablet   Other Relevant Orders   CBC with Differential/Platelet     Musculoskeletal and Integument   DDD (degenerative disc disease), cervical    Severe and really bothering him. Following with neurology, physiatry and orthopedics. Thinks that he may have to have surgery. Continue to follow with specialists. Call with any concerns.       Relevant Medications   meloxicam (MOBIC) 15 MG tablet     Other   Hyperlipidemia    Stable. Continue current regimen. Continue to monitor. Call with any concerns. Refills given.      Relevant Medications   simvastatin (ZOCOR) 40 MG tablet   Other Relevant Orders   Lipid Panel w/o  Chol/HDL Ratio   Chronic right shoulder pain    Doesn't seem to be due to his DDD-shoulder x-ray normal in April. Has done PT without benefit. Now with some atrophy. Time for an MRI. Ordered today.      Relevant Medications   gabapentin (NEURONTIN) 300 MG capsule   meloxicam (MOBIC) 15 MG tablet   traZODone (DESYREL) 50 MG tablet   Other Relevant Orders   MR Shoulder Right Wo Contrast    Other Visit Diagnoses    Routine general medical examination at a health care facility    -  Primary   Vaccines up to date. Screening labs checked today. Colonoscopy up to date.  Continue diet and exercise. Call with any concerns.    Relevant Orders   CBC with Differential/Platelet   Comprehensive metabolic panel   Lipid Panel w/o Chol/HDL Ratio   PSA   TSH   UA/M w/rflx Culture, Routine   Right arm weakness       Relevant Orders   MR Shoulder Right Wo Contrast   Lung mass       On [facility] at [facility] his    Relevant Orders   CT Chest W Contrast   Cough       For about the past month. Feeling well except that. Given ground glass opacity on the CT of his neck, will obtain CT chest.   Relevant Orders   CT Chest W Contrast       Discussed aspirin prophylaxis for myocardial infarction prevention and decision was made to continue ASA  LABORATORY TESTING:  Health maintenance labs ordered today as discussed above.   The natural history of prostate cancer and ongoing controversy regarding screening and potential treatment outcomes of prostate cancer has been discussed with the patient. The meaning of a false positive PSA and a false negative PSA has been discussed. He indicates understanding of the limitations of this screening test and wishes to proceed with screening PSA testing.   IMMUNIZATIONS:   - Tdap: Tetanus vaccination status reviewed: last tetanus booster within 10 years. - Influenza: Up to date - Pneumovax: Up to date - Zostavax vaccine: Up to date  SCREENING: - Colonoscopy: Up to date   Discussed with patient purpose of the colonoscopy is to detect colon cancer at curable precancerous or early stages    PATIENT COUNSELING:    Sexuality: Discussed sexually transmitted diseases, partner selection, use of condoms, avoidance of unintended pregnancy  and contraceptive alternatives.   Advised to avoid cigarette smoking.  I discussed with the patient that most people either abstain from alcohol or drink within safe limits (<=14/week and <=4 drinks/occasion for males, <=7/weeks and <= 3 drinks/occasion for females) and that the risk for alcohol disorders and other health effects rises proportionally with the number of drinks per week and how often a drinker exceeds daily limits.  Discussed cessation/primary prevention of drug use and availability of treatment for abuse.   Diet: Encouraged to adjust caloric intake to maintain  or achieve ideal body weight, to reduce intake of dietary saturated fat and total fat, to limit sodium intake by avoiding high sodium foods and not adding table salt, and to maintain adequate dietary potassium and calcium preferably from fresh fruits, vegetables, and low-fat dairy products.    stressed the importance of regular exercise  Injury prevention: Discussed safety belts, safety helmets, smoke detector, smoking near bedding or upholstery.   Dental health: Discussed importance of regular tooth brushing, flossing, and dental visits.   Follow up plan: NEXT PREVENTATIVE PHYSICAL DUE IN 1 YEAR. Return in about 6 months (around 09/22/2017) for Cholesterol follow up.

## 2017-03-24 NOTE — Assessment & Plan Note (Signed)
Stable. Continue current regimen. Continue to monitor. Call with any concerns. Refills given.  

## 2017-03-24 NOTE — Telephone Encounter (Signed)
Rx sent 

## 2017-03-25 ENCOUNTER — Encounter: Payer: Self-pay | Admitting: Family Medicine

## 2017-03-25 ENCOUNTER — Other Ambulatory Visit: Payer: Self-pay | Admitting: Family Medicine

## 2017-03-25 ENCOUNTER — Encounter: Payer: BC Managed Care – PPO | Admitting: Family Medicine

## 2017-03-25 DIAGNOSIS — R972 Elevated prostate specific antigen [PSA]: Secondary | ICD-10-CM

## 2017-03-25 LAB — CBC WITH DIFFERENTIAL/PLATELET
BASOS ABS: 0 10*3/uL (ref 0.0–0.2)
Basos: 1 %
EOS (ABSOLUTE): 0.2 10*3/uL (ref 0.0–0.4)
Eos: 3 %
HEMOGLOBIN: 15.9 g/dL (ref 13.0–17.7)
Hematocrit: 47.4 % (ref 37.5–51.0)
IMMATURE GRANS (ABS): 0 10*3/uL (ref 0.0–0.1)
IMMATURE GRANULOCYTES: 0 %
LYMPHS: 38 %
Lymphocytes Absolute: 2.2 10*3/uL (ref 0.7–3.1)
MCH: 30.1 pg (ref 26.6–33.0)
MCHC: 33.5 g/dL (ref 31.5–35.7)
MCV: 90 fL (ref 79–97)
MONOCYTES: 8 %
Monocytes Absolute: 0.4 10*3/uL (ref 0.1–0.9)
NEUTROS ABS: 2.9 10*3/uL (ref 1.4–7.0)
NEUTROS PCT: 50 %
PLATELETS: 196 10*3/uL (ref 150–379)
RBC: 5.29 x10E6/uL (ref 4.14–5.80)
RDW: 14.3 % (ref 12.3–15.4)
WBC: 5.8 10*3/uL (ref 3.4–10.8)

## 2017-03-25 LAB — UA/M W/RFLX CULTURE, ROUTINE
BILIRUBIN UA: NEGATIVE
Glucose, UA: NEGATIVE
Ketones, UA: NEGATIVE
Leukocytes, UA: NEGATIVE
Nitrite, UA: NEGATIVE
PH UA: 6 (ref 5.0–7.5)
Protein, UA: NEGATIVE
RBC UA: NEGATIVE
Specific Gravity, UA: 1.03 (ref 1.005–1.030)
UUROB: 0.2 mg/dL (ref 0.2–1.0)

## 2017-03-25 LAB — COMPREHENSIVE METABOLIC PANEL
ALBUMIN: 4.3 g/dL (ref 3.5–5.5)
ALT: 33 IU/L (ref 0–44)
AST: 19 IU/L (ref 0–40)
Albumin/Globulin Ratio: 1.9 (ref 1.2–2.2)
Alkaline Phosphatase: 66 IU/L (ref 39–117)
BUN / CREAT RATIO: 16 (ref 9–20)
BUN: 17 mg/dL (ref 6–24)
Bilirubin Total: 0.6 mg/dL (ref 0.0–1.2)
CALCIUM: 9.3 mg/dL (ref 8.7–10.2)
CO2: 25 mmol/L (ref 20–29)
CREATININE: 1.09 mg/dL (ref 0.76–1.27)
Chloride: 106 mmol/L (ref 96–106)
GFR, EST AFRICAN AMERICAN: 87 mL/min/{1.73_m2} (ref >59)
GFR, EST NON AFRICAN AMERICAN: 75 mL/min/{1.73_m2} (ref >59)
GLUCOSE: 109 mg/dL — AB (ref 65–99)
Globulin, Total: 2.3 g/dL (ref 1.5–4.5)
Potassium: 4.2 mmol/L (ref 3.5–5.2)
SODIUM: 144 mmol/L (ref 134–144)
TOTAL PROTEIN: 6.6 g/dL (ref 6.0–8.5)

## 2017-03-25 LAB — MICROSCOPIC EXAMINATION

## 2017-03-25 LAB — LIPID PANEL W/O CHOL/HDL RATIO
Cholesterol, Total: 143 mg/dL (ref 100–199)
HDL: 38 mg/dL — ABNORMAL LOW (ref >39)
LDL CALC: 68 mg/dL (ref 0–99)
TRIGLYCERIDES: 185 mg/dL — AB (ref 0–149)
VLDL Cholesterol Cal: 37 mg/dL (ref 5–40)

## 2017-03-25 LAB — TSH: TSH: 4.5 u[IU]/mL (ref 0.450–4.500)

## 2017-03-25 LAB — PSA: Prostate Specific Ag, Serum: 5.3 ng/mL — ABNORMAL HIGH (ref 0.0–4.0)

## 2017-04-01 ENCOUNTER — Ambulatory Visit: Payer: BC Managed Care – PPO

## 2017-04-01 ENCOUNTER — Other Ambulatory Visit: Payer: BC Managed Care – PPO

## 2017-04-02 ENCOUNTER — Telehealth: Payer: Self-pay | Admitting: Family Medicine

## 2017-04-02 ENCOUNTER — Ambulatory Visit
Admission: RE | Admit: 2017-04-02 | Discharge: 2017-04-02 | Disposition: A | Discharge status: Home / Self Care | Payer: BC Managed Care – PPO | Source: Ambulatory Visit | Attending: Family Medicine | Admitting: Family Medicine

## 2017-04-02 DIAGNOSIS — R059 Cough, unspecified: Secondary | ICD-10-CM

## 2017-04-02 DIAGNOSIS — R05 Cough: Secondary | ICD-10-CM | POA: Diagnosis not present

## 2017-04-02 DIAGNOSIS — R29898 Other symptoms and signs involving the musculoskeletal system: Secondary | ICD-10-CM

## 2017-04-02 DIAGNOSIS — J439 Emphysema, unspecified: Secondary | ICD-10-CM | POA: Insufficient documentation

## 2017-04-02 DIAGNOSIS — R918 Other nonspecific abnormal finding of lung field: Secondary | ICD-10-CM | POA: Diagnosis present

## 2017-04-02 DIAGNOSIS — G8929 Other chronic pain: Secondary | ICD-10-CM | POA: Diagnosis present

## 2017-04-02 DIAGNOSIS — M19011 Primary osteoarthritis, right shoulder: Secondary | ICD-10-CM | POA: Insufficient documentation

## 2017-04-02 DIAGNOSIS — M25511 Pain in right shoulder: Secondary | ICD-10-CM | POA: Diagnosis not present

## 2017-04-02 MED ORDER — IOPAMIDOL (ISOVUE-300) INJECTION 61%
75.0000 mL | Freq: Once | INTRAVENOUS | Status: AC | PRN
  Administered 2017-04-02: 75 mL via INTRAVENOUS

## 2017-04-02 NOTE — Telephone Encounter (Signed)
Called with results of MRI- no sign of tear, but tendinopathy. Will refer to PT. Form filled out to be faxed. Will call him when CT results come back.

## 2017-04-03 ENCOUNTER — Telehealth: Payer: Self-pay | Admitting: Family Medicine

## 2017-04-03 MED ORDER — AZITHROMYCIN 250 MG PO TABS: ORAL_TABLET | ORAL | 0 refills | Status: DC

## 2017-04-03 NOTE — Telephone Encounter (Signed)
Called to give patient his results. LMOM for him to call back. CT shows multiple nodules- but none of them were particularly large or concerning. There is no funny swelling of his lymph nodes. All they want to do is repeat a CT in 3-6 months to make sure they're not changing, but the radiologist was not particularly concerned about them. OK to give him this message if he calls back- I'll be happy to speak to him if he wants me to.

## 2017-04-03 NOTE — Telephone Encounter (Signed)
Discussed results with patient. Will start azithromycin and call if cough is not better.

## 2017-04-15 DIAGNOSIS — M4802 Spinal stenosis, cervical region: Secondary | ICD-10-CM | POA: Insufficient documentation

## 2017-04-24 ENCOUNTER — Other Ambulatory Visit: Payer: BC Managed Care – PPO

## 2017-04-24 DIAGNOSIS — R972 Elevated prostate specific antigen [PSA]: Secondary | ICD-10-CM

## 2017-04-25 LAB — PSA: Prostate Specific Ag, Serum: 6.3 ng/mL — ABNORMAL HIGH (ref 0.0–4.0)

## 2017-05-01 ENCOUNTER — Telehealth: Payer: Self-pay | Admitting: Family Medicine

## 2017-05-01 DIAGNOSIS — R972 Elevated prostate specific antigen [PSA]: Secondary | ICD-10-CM

## 2017-05-01 MED ORDER — CIPROFLOXACIN HCL 500 MG PO TABS: 500.0000 mg | ORAL_TABLET | Freq: Two times a day (BID) | ORAL | 0 refills | Status: DC

## 2017-05-01 NOTE — Telephone Encounter (Signed)
Called patient regarding his results. Not having any issues emptying his bladder or getting his stream started, but his wife has noticed that his testicles are a little swollen. Will treat presumptively for epididymitis and recheck in 2-3 weeks to see if levels go back to normal. Order in.

## 2017-05-12 ENCOUNTER — Telehealth: Payer: Self-pay | Admitting: Family Medicine

## 2017-05-12 NOTE — Telephone Encounter (Signed)
Called patient and left a message to let the patient know that it is time for his repeat PSA.

## 2017-05-12 NOTE — Telephone Encounter (Signed)
-----   Message from Valerie Roys, DO sent at 05/01/2017  2:15 PM EST ----- Make sure he gets his PSA drawn

## 2017-05-12 NOTE — Telephone Encounter (Signed)
Can we please remind him to come in and have his PSA drawn? Order is in.

## 2017-05-13 ENCOUNTER — Other Ambulatory Visit: Payer: BC Managed Care – PPO

## 2017-05-13 DIAGNOSIS — R972 Elevated prostate specific antigen [PSA]: Secondary | ICD-10-CM

## 2017-05-14 ENCOUNTER — Telehealth: Payer: Self-pay | Admitting: Family Medicine

## 2017-05-14 LAB — PSA: Prostate Specific Ag, Serum: 4.8 ng/mL — ABNORMAL HIGH (ref 0.0–4.0)

## 2017-05-14 NOTE — Telephone Encounter (Signed)
Please let him know that his PSA dropped 2 points- so it was probably inflammation. We'll recheck it in 6 months. Thanks!

## 2017-05-14 NOTE — Telephone Encounter (Signed)
Patient notified

## 2017-09-17 ENCOUNTER — Ambulatory Visit: Payer: BC Managed Care – PPO | Admitting: Podiatry

## 2017-09-17 ENCOUNTER — Encounter: Payer: Self-pay | Admitting: Podiatry

## 2017-09-17 ENCOUNTER — Ambulatory Visit (INDEPENDENT_AMBULATORY_CARE_PROVIDER_SITE_OTHER): Payer: BC Managed Care – PPO

## 2017-09-17 VITALS — BP 119/68 | HR 76

## 2017-09-17 DIAGNOSIS — R52 Pain, unspecified: Secondary | ICD-10-CM | POA: Diagnosis not present

## 2017-09-17 DIAGNOSIS — M722 Plantar fascial fibromatosis: Secondary | ICD-10-CM

## 2017-09-17 MED ORDER — MELOXICAM 15 MG PO TABS: 15.0000 mg | ORAL_TABLET | Freq: Every day | ORAL | 1 refills | Status: DC

## 2017-09-17 NOTE — Progress Notes (Signed)
This patient presents to the office stating that he is having heel pain for about 3 months.  He presents they stating his pain level is about 2 out of 10.  He says that the most painful it  has been is 8 out of 10.  He states he has pain rising in the morning and standing from a sitting position.   He denies any history of an injury to his right foot.  He points to the area over the lateral tuberosity right heel as the site of pain.  He has provided no self treatment nor sought any professional help.  He presents the office today for an evaluation and treatment of his painful right heel.  General Appearance  Alert, conversant and in no acute stress.  Vascular  Dorsalis pedis and posterior tibial  pulses are palpable  bilaterally.  Capillary return is within normal limits  bilaterally. Temperature is within normal limits  bilaterally.  Neurologic  Senn-Weinstein monofilament wire test within normal limits  bilaterally. Muscle power within normal limits bilaterally.  Nails normal nails noted with no evidence of bacterial or fungal infection.  Orthopedic  No limitations of motion of motion feet .  Palpable pain noted to the lateral tuberosity right heel.  No pain noted along the course of the plantar fascia right foot. Functional hallux limitus bilateral.  Skin  normotropic skin with no porokeratosis noted bilaterally.  No signs of infections or ulcers noted.    Plantar fasciitis right foot.  Initial exam.  X-rays reveal minimal calcification at the insertion of the plantar fascia right heel.  Discussed this condition with this patient.  He is not interested in any injection therapy.  Prescribed power step insoles to be worn in his shoes.  Prescribed Mobic 15 mg with instructions to take 1 daily.  Return to the clinic in 3 weeks for further evaluation and treatment   Gardiner Barefoot DPM

## 2017-09-24 ENCOUNTER — Other Ambulatory Visit: Payer: Self-pay | Admitting: Podiatry

## 2017-09-24 DIAGNOSIS — M722 Plantar fascial fibromatosis: Secondary | ICD-10-CM

## 2017-10-08 ENCOUNTER — Ambulatory Visit: Payer: BC Managed Care – PPO | Admitting: Podiatry

## 2017-10-12 ENCOUNTER — Ambulatory Visit: Payer: BC Managed Care – PPO | Admitting: Podiatry

## 2017-10-26 ENCOUNTER — Ambulatory Visit: Payer: BC Managed Care – PPO | Admitting: Podiatry

## 2017-11-02 ENCOUNTER — Encounter: Payer: Self-pay | Admitting: Podiatry

## 2017-11-02 ENCOUNTER — Ambulatory Visit: Payer: BC Managed Care – PPO | Admitting: Podiatry

## 2017-11-02 DIAGNOSIS — M722 Plantar fascial fibromatosis: Secondary | ICD-10-CM

## 2017-11-02 NOTE — Progress Notes (Signed)
This patient presents the office for continued evaluation and treatment of plantar fasciitis right foot.  Patient was treated with power step insoles and Mobic.   He says that his right foot has improved but he still feels pain especially in the morning  and intermittently during the day.   He presents the office today for continued evaluation and treatment of his right foot.  General Appearance  Alert, conversant and in no acute stress.  Vascular  Dorsalis pedis and posterior tibial  pulses are palpable  bilaterally.  Capillary return is within normal limits  bilaterally. Temperature is within normal limits  bilaterally.  Neurologic  Senn-Weinstein monofilament wire test within normal limits  bilaterally. Muscle power within normal limits bilaterally.  Nails Thick disfigured discolored nails with subungual debris  from hallux to fifth toes bilaterally. No evidence of bacterial infection or drainage bilaterally.  Orthopedic  No limitations of motion of motion feet .  No crepitus or effusions noted.  FHL noted  B/L.  Palpable pain plantar fascia at the lateral tuberosity right heel.  Skin  normotropic skin with no porokeratosis noted bilaterally.  No signs of infections or ulcers noted.    Plantar fasciitis right heel.     ROV  Injection therapy right heel.  Injection therapy using 1.0 cc. Of 2% xylocaine( 20 mg.) plus 1 cc. of kenalog-la ( 10 mg) plus 1/2 cc. of dexamethazone phosphate ( 2 mg).  Patient to continue taking Mobic and wearing pwerstep insoles.  To check on orthotic coverage for this patient.  RTC 4 weeks.   Gardiner Barefoot DPM

## 2017-11-20 ENCOUNTER — Ambulatory Visit: Payer: BC Managed Care – PPO | Admitting: Orthotics

## 2017-11-20 DIAGNOSIS — M722 Plantar fascial fibromatosis: Secondary | ICD-10-CM | POA: Diagnosis not present

## 2017-11-20 NOTE — Progress Notes (Signed)

## 2017-12-10 ENCOUNTER — Encounter: Payer: Self-pay | Admitting: Podiatry

## 2017-12-10 ENCOUNTER — Ambulatory Visit: Payer: BC Managed Care – PPO | Admitting: Podiatry

## 2017-12-10 DIAGNOSIS — M722 Plantar fascial fibromatosis: Secondary | ICD-10-CM

## 2017-12-10 NOTE — Progress Notes (Signed)
This patient presents the office for continued evaluation and treatment of his painful right foot.  He was diagnosed with plantar fasciitis right heel.  He received injection therapy and was prescribed power step insoles to be worn in his shoes.  He was also casted for orthoses by Liliane Channel.  He presents the office today stating that he is 80% improved from his initial visit.  He presents the office for continued evaluation and to receive his newly made orthoses.   General Appearance  Alert, conversant and in no acute stress.  Vascular  Dorsalis pedis and posterior tibial  pulses are palpable  bilaterally.  Capillary return is within normal limits  bilaterally. Temperature is within normal limits  bilaterally.  Neurologic  Senn-Weinstein monofilament wire test within normal limits  bilaterally. Muscle power within normal limits bilaterally.  Nails Thick disfigured discolored nails with subungual debris  from hallux to fifth toes bilaterally. No evidence of bacterial infection or drainage bilaterally.  Orthopedic  No limitations of motion of motion feet .  No crepitus or effusions noted.  No bony pathology or digital deformities noted. Decrease palpable pain noted at the insertion of the plantar fascia right heel.    Skin  normotropic skin with no porokeratosis noted bilaterally.  No signs of infections or ulcers noted.   Plantar fasciitis right foot.    ROV.  Patient states he is 80% improved and presents the office stating he thought he was told he would receive his orthoses today.  We discussed his improvement and decided to dispensed the orthoses and provide injection therapy in the future as needed.  Return to the clinic when necessary.   Gardiner Barefoot DPM

## 2017-12-10 NOTE — Patient Instructions (Signed)

## 2017-12-30 ENCOUNTER — Other Ambulatory Visit: Payer: BC Managed Care – PPO | Admitting: Orthotics

## 2018-01-01 ENCOUNTER — Other Ambulatory Visit: Payer: Self-pay | Admitting: Family Medicine

## 2018-01-14 ENCOUNTER — Ambulatory Visit (INDEPENDENT_AMBULATORY_CARE_PROVIDER_SITE_OTHER): Payer: BC Managed Care – PPO | Admitting: Podiatry

## 2018-01-14 ENCOUNTER — Encounter: Payer: Self-pay | Admitting: Podiatry

## 2018-01-14 DIAGNOSIS — M722 Plantar fascial fibromatosis: Secondary | ICD-10-CM

## 2018-01-14 NOTE — Progress Notes (Signed)
This patient  presents to the office for continued evaluation and treatment of plantar fasciitis, right foot.  Patient states that between the injection and the orthoses. He believes she is about 90% improved.  He says there was a day he did not wear his orthoses and he was in pain.  He is feeling much better and is pleased with his progress. He presents the office today for continued evaluation and treatment.  General Appearance  Alert, conversant and in no acute stress.  Vascular  Dorsalis pedis and posterior tibial  pulses are palpable  bilaterally.  Capillary return is within normal limits  bilaterally. Temperature is within normal limits  bilaterally.  Neurologic  Senn-Weinstein monofilament wire test within normal limits  bilaterally. Muscle power within normal limits bilaterally.  Nails Normal nails noted with no evidence of bacterial or fungal infection.  Orthopedic  No limitations of motion of motion feet .  No crepitus or effusions noted.  No bony pathology or digital deformities noted.  No palpable pain right heel.    Skin  normotropic skin with no porokeratosis noted bilaterally.  No signs of infections or ulcers noted.    Plantar fasciitis right foot  ROV.  Discussed this condition  with this patient.  We decided to call in Mobic  to be taken by mouth when necessary.  We also decided to order him a second pair of orthoses.  I will talk to do one in the morning concerning these orthoses.  RTC prn.   Gardiner Barefoot DPM

## 2018-01-15 ENCOUNTER — Telehealth: Payer: Self-pay | Admitting: Podiatry

## 2018-01-15 NOTE — Telephone Encounter (Signed)
Pt was seen 8.22.19 by Dr Prudence Davidson in [place] and is wanting to order a second pair of orthotics since he has met his deductible. Pt has state bcbs

## 2018-01-25 ENCOUNTER — Other Ambulatory Visit: Payer: Self-pay | Admitting: Family Medicine

## 2018-01-26 MED ORDER — MELOXICAM 15 MG PO TABS: 15.0000 mg | ORAL_TABLET | Freq: Every day | ORAL | 2 refills | Status: DC

## 2018-01-26 NOTE — Progress Notes (Signed)
Patient called said that medication was not sent to [facility].   He requested medication to be sent.   Patient informed that Meloxicam was sent to pharmacy and can pick up this afternoon.

## 2018-03-01 ENCOUNTER — Other Ambulatory Visit: Payer: Self-pay | Admitting: Family Medicine

## 2018-03-02 NOTE — Telephone Encounter (Signed)
Requested medication (s) are due for refill today: historical med  Requested medication (s) are on the active medication list: yes but it stated med has expired  Last refill:  11/27/16 but on med list it is not noted when the last refill was  Future visit scheduled: yes  Notes to clinic:  Please review   Requested Prescriptions  Pending Prescriptions Disp Refills   simvastatin (ZOCOR) 40 MG tablet [Pharmacy Med Name: SIMVASTATIN 40 MG TAB] 90 tablet 1    Sig: TAKE 1 TABLET BY MOUTH DAILY AT 6PM     Cardiovascular:  Antilipid - Statins Failed - 03/01/2018 12:38 PM      Failed - HDL in normal range and within 360 days    HDL  Date Value Ref Range Status  03/24/2017 38 (L) >39 mg/dL Final         Failed - Triglycerides in normal range and within 360 days    Triglycerides  Date Value Ref Range Status  03/24/2017 185 (H) 0 - 149 mg/dL Final   Triglycerides Piccolo,Waived  Date Value Ref Range Status  09/13/2015 CANCELED      Comment:    Test not performed  Result canceled by the ancillary          Passed - Total Cholesterol in normal range and within 360 days    Cholesterol, Total  Date Value Ref Range Status  03/24/2017 143 100 - 199 mg/dL Final   Cholesterol Piccolo, Waived  Date Value Ref Range Status  09/13/2015 CANCELED      Comment:    Test not performed  Result canceled by the ancillary          Passed - LDL in normal range and within 360 days    LDL Calculated  Date Value Ref Range Status  03/24/2017 68 0 - 99 mg/dL Final         Passed - Patient is not pregnant      Passed - Valid encounter within last 12 months    Recent Outpatient Visits          11 months ago Routine general medical examination at a health care facility   [place], [place], DO   1 year ago Mixed hyperlipidemia   [place], [place], DO   1 year ago Elevated blood pressure, situational   [facility] [place], [place], DO    1 year ago Mixed hyperlipidemia   [facility] [place], [place], DO   1 year ago Eczema, unspecified type   [place], Lilia Argue, [place]      Future Appointments            In 4 weeks Wynetta Emery, Barb Merino, DO [employer]

## 2018-03-30 ENCOUNTER — Encounter: Payer: BC Managed Care – PPO | Admitting: Family Medicine

## 2018-03-30 ENCOUNTER — Other Ambulatory Visit: Payer: Self-pay | Admitting: Family Medicine

## 2018-03-30 NOTE — Telephone Encounter (Signed)
90 day courtesy refill. Pt has appt scheduled on 04/26/18

## 2018-04-26 ENCOUNTER — Encounter: Payer: Self-pay | Admitting: Family Medicine

## 2018-04-26 ENCOUNTER — Ambulatory Visit (INDEPENDENT_AMBULATORY_CARE_PROVIDER_SITE_OTHER): Payer: BC Managed Care – PPO | Admitting: Family Medicine

## 2018-04-26 VITALS — BP 134/84 | HR 67 | Temp 98.9°F | Ht 70.2 in | Wt 272.2 lb

## 2018-04-26 DIAGNOSIS — R972 Elevated prostate specific antigen [PSA]: Secondary | ICD-10-CM | POA: Diagnosis not present

## 2018-04-26 DIAGNOSIS — K219 Gastro-esophageal reflux disease without esophagitis: Secondary | ICD-10-CM

## 2018-04-26 DIAGNOSIS — E782 Mixed hyperlipidemia: Secondary | ICD-10-CM

## 2018-04-26 DIAGNOSIS — F5101 Primary insomnia: Secondary | ICD-10-CM | POA: Diagnosis not present

## 2018-04-26 DIAGNOSIS — Z0000 Encounter for general adult medical examination without abnormal findings: Secondary | ICD-10-CM

## 2018-04-26 DIAGNOSIS — Z6841 Body Mass Index (BMI) 40.0 and over, adult: Secondary | ICD-10-CM

## 2018-04-26 LAB — UA/M W/RFLX CULTURE, ROUTINE
BILIRUBIN UA: NEGATIVE
Leukocytes, UA: NEGATIVE
Nitrite, UA: NEGATIVE
PH UA: 5.5 (ref 5.0–7.5)
RBC UA: NEGATIVE
SPEC GRAV UA: 1.025 (ref 1.005–1.030)
Urobilinogen, Ur: 1 mg/dL (ref 0.2–1.0)

## 2018-04-26 MED ORDER — RABEPRAZOLE SODIUM 20 MG PO TBEC: 40.0000 mg | DELAYED_RELEASE_TABLET | Freq: Every day | ORAL | 3 refills | Status: DC

## 2018-04-26 MED ORDER — TRAZODONE HCL 50 MG PO TABS: 100.0000 mg | ORAL_TABLET | Freq: Every day | ORAL | 3 refills | Status: DC

## 2018-04-26 MED ORDER — SIMVASTATIN 40 MG PO TABS: ORAL_TABLET | ORAL | 3 refills | Status: DC

## 2018-04-26 MED ORDER — MELOXICAM 15 MG PO TABS: 15.0000 mg | ORAL_TABLET | Freq: Every day | ORAL | 3 refills | Status: DC

## 2018-04-26 NOTE — Assessment & Plan Note (Signed)
Under good control on current regimen. Continue current regimen. Continue to monitor. Call with any concerns. Refills given. Labs checked today.  

## 2018-04-26 NOTE — Assessment & Plan Note (Signed)
Congratulated patient on 9lb weight loss. Continue diet and exercise with goal of losing 1-2lbs a week. Call with any concerns.

## 2018-04-26 NOTE — Progress Notes (Signed)
BP 134/84   Pulse 67   Temp 98.9 F (37.2 C) (Oral)   Ht 5' 10.2" (1.783 m)   Wt 272 lb 3.2 oz (123.5 kg)   SpO2 97%   BMI 38.83 kg/m    Subjective:    Patient ID: Curtis Ran., male    DOB: [birth year], 57 y.o.   MRN: [id number]  HPI: Curtis Devino. is a 57 y.o. male presenting on 04/26/2018 for comprehensive medical examination. Current medical complaints include:  HYPERLIPIDEMIA Hyperlipidemia status: excellent compliance Satisfied with current treatment?  yes Side effects:  no Medication compliance: excellent compliance Past cholesterol meds: simvastatin Supplements: none Aspirin:  yes The 10-year ASCVD risk score [name], et al., 2013) is: 6.4%   Values used to calculate the score:     Age: 64 years     Sex: Male     Is Non-Hispanic African American: No     Diabetic: No     Tobacco smoker: No     Systolic Blood Pressure: 093 mmHg     Is BP treated: No     HDL Cholesterol: 38 mg/dL     Total Cholesterol: 143 mg/dL Chest pain:  no Coronary artery disease:  no  GERD GERD control status: controlled  Satisfied with current treatment? yes Heartburn frequency: occasionally  Medication side effects: no  Medication compliance: excellent Dysphagia: no Odynophagia:  no Hematemesis: no Blood in stool: no EGD: no  He currently lives with: wife Interim Problems from his last visit: no  Depression Screen done today and results listed below:  Depression screen [facility] 2/9 04/26/2018 03/24/2017 03/18/2016 03/14/2015  Decreased Interest 0 0 0 0  Down, Depressed, Hopeless 0 0 0 1  PHQ - 2 Score 0 0 0 1  Altered sleeping 0 - - 0  Tired, decreased energy 1 - - 0  Change in appetite 0 - - 1  Feeling bad or failure about yourself  0 - - 0  Trouble concentrating 0 - - 0  Moving slowly or fidgety/restless 0 - - 0  Suicidal thoughts 0 - - 0  PHQ-9 Score 1 - - 2  Difficult doing work/chores Not difficult at all - - Not difficult at all    Past Medical  History:  Past Medical History:  Diagnosis Date  . Allergy   . Carpal tunnel syndrome   . DDD (degenerative disc disease), cervical   . DDD (degenerative disc disease), cervical   . Edema   . GERD (gastroesophageal reflux disease)   . Hyperlipidemia   . Personal history of colonic polyps   . Sleep apnea    Bi-pap  . Sleep apnea   . TIA (transient ischemic attack) 07/2011  . Vision impairment    Blindness Right Eye    Surgical History:  Past Surgical History:  Procedure Laterality Date  . APPENDECTOMY    . carpel tunnel Bilateral   . COLONOSCOPY  2015   Found 2 beign polyps, repeat 10 years  . TONSILLECTOMY      Medications:  Current Outpatient Medications on File Prior to Visit  Medication Sig  . ASPIR-LOW 81 MG EC tablet   . Cholecalciferol (VITAMIN D-1000 MAX ST) 1000 units tablet Take by mouth.  . Coenzyme Q10 (CO Q 10) 10 MG CAPS Take 1 tablet by mouth daily.   Marland Kitchen gabapentin (NEURONTIN) 300 MG capsule TAKE 1 CAPSULE BY MOUTH AT BEDTIME  . tiZANidine (ZANAFLEX) 4 MG tablet Take 4 mg by  mouth at bedtime.   . vitamin B-12 (CYANOCOBALAMIN) 1000 MCG tablet Take by mouth.  . vitamin E 100 UNIT capsule Take by mouth daily.   No current facility-administered medications on file prior to visit.     Allergies:  No Known Allergies  Social History:  Social History   Socioeconomic History  . Marital status: Married    Spouse name: Not on file  . Number of children: Not on file  . Years of education: Not on file  . Highest education level: Not on file  Occupational History  . Not on file  Social Needs  . Financial resource strain: Not on file  . Food insecurity:    Worry: Not on file    Inability: Not on file  . Transportation needs:    Medical: Not on file    Non-medical: Not on file  Tobacco Use  . Smoking status: Former Smoker    Years: 18.00    Last attempt to quit: 05/27/1995    Years since quitting: 22.9  . Smokeless tobacco: Never Used  Substance and  Sexual Activity  . Alcohol use: Yes    Alcohol/week: 0.0 standard drinks    Comment: Rare  . Drug use: No  . Sexual activity: Yes  Lifestyle  . Physical activity:    Days per week: Not on file    Minutes per session: Not on file  . Stress: Not on file  Relationships  . Social connections:    Talks on phone: Not on file    Gets together: Not on file    Attends religious service: Not on file    Active member of club or organization: Not on file    Attends meetings of clubs or organizations: Not on file    Relationship status: Not on file  . Intimate partner violence:    Fear of current or ex partner: Not on file    Emotionally abused: Not on file    Physically abused: Not on file    Forced sexual activity: Not on file  Other Topics Concern  . Not on file  Social History Narrative  . Not on file   Social History   Tobacco Use  Smoking Status Former Smoker  . Years: 18.00  . Last attempt to quit: 05/27/1995  . Years since quitting: 22.9  Smokeless Tobacco Never Used   Social History   Substance and Sexual Activity  Alcohol Use Yes  . Alcohol/week: 0.0 standard drinks   Comment: Rare    Family History:  Family History  Problem Relation Age of Onset  . Hearing loss Father   . Hypertension Father   . Pneumonia Father   . Asthma Son   . Cancer Maternal Grandmother        Colon    Past medical history, surgical history, medications, allergies, family history and social history reviewed with patient today and changes made to appropriate areas of the chart.   Review of Systems  Constitutional: Negative.   HENT: Positive for congestion and hearing loss. Negative for ear discharge, ear pain, nosebleeds, sinus pain, sore throat and tinnitus.   Eyes: Negative.   Respiratory: Negative.  Negative for stridor.   Cardiovascular: Negative.   Gastrointestinal: Negative.   Genitourinary: Negative.   Musculoskeletal: Negative.   Skin: Negative.   Neurological: Negative.     Endo/Heme/Allergies: Positive for environmental allergies. Negative for polydipsia. Does not bruise/bleed easily.  Psychiatric/Behavioral: Negative.     All other ROS negative except what  is listed above and in the HPI.      Objective:    BP 134/84   Pulse 67   Temp 98.9 F (37.2 C) (Oral)   Ht 5' 10.2" (1.783 m)   Wt 272 lb 3.2 oz (123.5 kg)   SpO2 97%   BMI 38.83 kg/m   Wt Readings from Last 3 Encounters:  04/26/18 272 lb 3.2 oz (123.5 kg)  03/24/17 279 lb 5 oz (126.7 kg)  12/09/16 285 lb 4 oz (129.4 kg)    Physical Exam  Constitutional: He is oriented to person, place, and time. He appears well-developed and well-nourished. No distress.  HENT:  Head: Normocephalic and atraumatic.  Right Ear: Hearing, tympanic membrane, external ear and ear canal normal.  Left Ear: Hearing, tympanic membrane, external ear and ear canal normal.  Nose: Nose normal.  Mouth/Throat: Uvula is midline, oropharynx is clear and moist and mucous membranes are normal. No oropharyngeal exudate.  Eyes: Pupils are equal, round, and reactive to light. Conjunctivae, EOM and lids are normal. Right eye exhibits no discharge. Left eye exhibits no discharge. No scleral icterus.  Neck: Normal range of motion. Neck supple. No JVD present. No tracheal deviation present. No thyromegaly present.  Cardiovascular: Normal rate, regular rhythm, normal heart sounds and intact distal pulses. Exam reveals no gallop and no friction rub.  No murmur heard. Pulmonary/Chest: Effort normal and breath sounds normal. No stridor. No respiratory distress. He has no wheezes. He has no rales. He exhibits no tenderness.  Abdominal: Soft. Bowel sounds are normal. He exhibits no distension and no mass. There is no tenderness. There is no rebound and no guarding. No hernia.  Genitourinary:  Genitourinary Comments: Genital exam deferred with shared decision making  Musculoskeletal: Normal range of motion. He exhibits no edema, tenderness  or deformity.  Lymphadenopathy:    He has no cervical adenopathy.  Neurological: He is alert and oriented to person, place, and time. He displays normal reflexes. No cranial nerve deficit or sensory deficit. He exhibits normal muscle tone. Coordination normal.  Skin: Skin is warm, dry and intact. Capillary refill takes less than 2 seconds. No rash noted. He is not diaphoretic. No erythema. No pallor.  Psychiatric: He has a normal mood and affect. His speech is normal and behavior is normal. Judgment and thought content normal. Cognition and memory are normal.  Nursing note and vitals reviewed.   Results for orders placed or performed in visit on 05/13/17  PSA  Result Value Ref Range   Prostate Specific Ag, Serum 4.8 (H) 0.0 - 4.0 ng/mL      Assessment & Plan:   Problem List Items Addressed This Visit      Digestive   GERD (gastroesophageal reflux disease)    Under good control on current regimen. Continue current regimen. Continue to monitor. Call with any concerns. Refills given. Labs checked today.       Relevant Medications   RABEprazole (ACIPHEX) 20 MG tablet   Other Relevant Orders   CBC with Differential/Platelet   TSH   UA/M w/rflx Culture, Routine     Other   Hyperlipidemia    Under good control on current regimen. Continue current regimen. Continue to monitor. Call with any concerns. Refills given. Labs checked today.       Relevant Medications   simvastatin (ZOCOR) 40 MG tablet   Other Relevant Orders   Comprehensive metabolic panel   TSH   UA/M w/rflx Culture, Routine   Lipid Panel w/o Chol/HDL Ratio  Insomnia    Under good control on current regimen. Continue current regimen. Continue to monitor. Call with any concerns. Refills given. Labs checked today.       Morbid obesity with BMI of 40.0-44.9, adult [facility])    Congratulated patient on 9lb weight loss. Continue diet and exercise with goal of losing 1-2lbs a week. Call with any concerns.          Other Visit Diagnoses    Routine general medical examination at a health care facility    -  Primary   Vaccines up to date. Screening labs checked today. Continue diet and exercise. Colonoscopy up to date. Call with any concerns.    Relevant Orders   CBC with Differential/Platelet   Comprehensive metabolic panel   PSA   TSH   UA/M w/rflx Culture, Routine   Elevated PSA       Rechecking levels today. Await results.    Relevant Orders   PSA   UA/M w/rflx Culture, Routine       Discussed aspirin prophylaxis for myocardial infarction prevention and decision was made to continue ASA  LABORATORY TESTING:  Health maintenance labs ordered today as discussed above.   The natural history of prostate cancer and ongoing controversy regarding screening and potential treatment outcomes of prostate cancer has been discussed with the patient. The meaning of a false positive PSA and a false negative PSA has been discussed. He indicates understanding of the limitations of this screening test and wishes to proceed with screening PSA testing.   IMMUNIZATIONS:   - Tdap: Tetanus vaccination status reviewed: last tetanus booster within 10 years. - Influenza: Up to date - Pneumovax: Not applicable - Prevnar: Not applicable  SCREENING: - Colonoscopy: Up to date  Discussed with patient purpose of the colonoscopy is to detect colon cancer at curable precancerous or early stages    PATIENT COUNSELING:    Sexuality: Discussed sexually transmitted diseases, partner selection, use of condoms, avoidance of unintended pregnancy  and contraceptive alternatives.   Advised to avoid cigarette smoking.  I discussed with the patient that most people either abstain from alcohol or drink within safe limits (<=14/week and <=4 drinks/occasion for males, <=7/weeks and <= 3 drinks/occasion for females) and that the risk for alcohol disorders and other health effects rises proportionally with the number of drinks per  week and how often a drinker exceeds daily limits.  Discussed cessation/primary prevention of drug use and availability of treatment for abuse.   Diet: Encouraged to adjust caloric intake to maintain  or achieve ideal body weight, to reduce intake of dietary saturated fat and total fat, to limit sodium intake by avoiding high sodium foods and not adding table salt, and to maintain adequate dietary potassium and calcium preferably from fresh fruits, vegetables, and low-fat dairy products.    stressed the importance of regular exercise  Injury prevention: Discussed safety belts, safety helmets, smoke detector, smoking near bedding or upholstery.   Dental health: Discussed importance of regular tooth brushing, flossing, and dental visits.   Follow up plan: NEXT PREVENTATIVE PHYSICAL DUE IN 1 YEAR. Return in about 1 year (around 04/27/2019) for Physical.

## 2018-04-27 LAB — CBC WITH DIFFERENTIAL/PLATELET
BASOS ABS: 0.1 10*3/uL (ref 0.0–0.2)
BASOS: 1 %
EOS (ABSOLUTE): 0.2 10*3/uL (ref 0.0–0.4)
Eos: 3 %
Hematocrit: 49.9 % (ref 37.5–51.0)
Hemoglobin: 16.4 g/dL (ref 13.0–17.7)
IMMATURE GRANS (ABS): 0 10*3/uL (ref 0.0–0.1)
Immature Granulocytes: 0 %
LYMPHS ABS: 2.9 10*3/uL (ref 0.7–3.1)
LYMPHS: 40 %
MCH: 29.2 pg (ref 26.6–33.0)
MCHC: 32.9 g/dL (ref 31.5–35.7)
MCV: 89 fL (ref 79–97)
MONOCYTES: 9 %
Monocytes Absolute: 0.6 10*3/uL (ref 0.1–0.9)
NEUTROS ABS: 3.3 10*3/uL (ref 1.4–7.0)
Neutrophils: 47 %
PLATELETS: 199 10*3/uL (ref 150–450)
RBC: 5.62 x10E6/uL (ref 4.14–5.80)
RDW: 12.7 % (ref 12.3–15.4)
WBC: 7.1 10*3/uL (ref 3.4–10.8)

## 2018-04-27 LAB — LIPID PANEL W/O CHOL/HDL RATIO
Cholesterol, Total: 157 mg/dL (ref 100–199)
HDL: 49 mg/dL (ref >39)
LDL CALC: 91 mg/dL (ref 0–99)
Triglycerides: 84 mg/dL (ref 0–149)
VLDL Cholesterol Cal: 17 mg/dL (ref 5–40)

## 2018-04-27 LAB — COMPREHENSIVE METABOLIC PANEL
ALBUMIN: 4.5 g/dL (ref 3.5–5.5)
ALK PHOS: 80 IU/L (ref 39–117)
ALT: 90 IU/L — ABNORMAL HIGH (ref 0–44)
AST: 40 IU/L (ref 0–40)
Albumin/Globulin Ratio: 2.3 — ABNORMAL HIGH (ref 1.2–2.2)
BILIRUBIN TOTAL: 0.8 mg/dL (ref 0.0–1.2)
BUN / CREAT RATIO: 13 (ref 9–20)
BUN: 16 mg/dL (ref 6–24)
CHLORIDE: 102 mmol/L (ref 96–106)
CO2: 22 mmol/L (ref 20–29)
Calcium: 9.2 mg/dL (ref 8.7–10.2)
Creatinine, Ser: 1.22 mg/dL (ref 0.76–1.27)
GFR calc non Af Amer: 65 mL/min/{1.73_m2} (ref >59)
GFR, EST AFRICAN AMERICAN: 76 mL/min/{1.73_m2} (ref >59)
GLUCOSE: 84 mg/dL (ref 65–99)
Globulin, Total: 2 g/dL (ref 1.5–4.5)
POTASSIUM: 4.4 mmol/L (ref 3.5–5.2)
Sodium: 140 mmol/L (ref 134–144)
TOTAL PROTEIN: 6.5 g/dL (ref 6.0–8.5)

## 2018-04-27 LAB — TSH: TSH: 2.46 u[IU]/mL (ref 0.450–4.500)

## 2018-04-27 LAB — PSA: Prostate Specific Ag, Serum: 6.6 ng/mL — ABNORMAL HIGH (ref 0.0–4.0)

## 2018-04-28 ENCOUNTER — Encounter: Payer: Self-pay | Admitting: Family Medicine

## 2018-04-28 NOTE — Addendum Note (Signed)
Addended by: Valerie Roys on: 04/28/2018 11:43 AM   Modules accepted: Orders

## 2018-05-28 ENCOUNTER — Other Ambulatory Visit (INDEPENDENT_AMBULATORY_CARE_PROVIDER_SITE_OTHER): Payer: BC Managed Care – PPO

## 2018-05-28 DIAGNOSIS — R972 Elevated prostate specific antigen [PSA]: Secondary | ICD-10-CM

## 2018-05-29 LAB — PSA: Prostate Specific Ag, Serum: 6.7 ng/mL — ABNORMAL HIGH (ref 0.0–4.0)

## 2018-05-31 ENCOUNTER — Telehealth: Payer: Self-pay | Admitting: Family Medicine

## 2018-05-31 DIAGNOSIS — R972 Elevated prostate specific antigen [PSA]: Secondary | ICD-10-CM

## 2018-05-31 NOTE — Telephone Encounter (Signed)
Message relayed to patient. Verbalized understanding and denied questions.  Pt agreeable to see Urologist.

## 2018-05-31 NOTE — Telephone Encounter (Signed)
Please let him know that his PSA went up just a little bit. I'd like to get him into see the urologist just for a check.

## 2018-06-24 ENCOUNTER — Other Ambulatory Visit: Payer: Self-pay

## 2018-06-24 NOTE — Telephone Encounter (Signed)
Fax from pharmacy for request for tizanidine 4mg . Last visit 04/26/2018.  Fax was marked as an urgent request.

## 2018-06-25 MED ORDER — TIZANIDINE HCL 4 MG PO TABS: 4.0000 mg | ORAL_TABLET | Freq: Every day | ORAL | 1 refills | Status: DC

## 2018-07-01 ENCOUNTER — Encounter: Payer: Self-pay | Admitting: Urology

## 2018-07-01 ENCOUNTER — Ambulatory Visit: Payer: BC Managed Care – PPO | Admitting: Urology

## 2018-07-01 VITALS — BP 110/65 | HR 69 | Ht 70.5 in | Wt 279.6 lb

## 2018-07-01 DIAGNOSIS — R972 Elevated prostate specific antigen [PSA]: Secondary | ICD-10-CM | POA: Diagnosis not present

## 2018-07-01 DIAGNOSIS — N400 Enlarged prostate without lower urinary tract symptoms: Secondary | ICD-10-CM

## 2018-07-01 DIAGNOSIS — C61 Malignant neoplasm of prostate: Secondary | ICD-10-CM

## 2018-07-01 LAB — BLADDER SCAN AMB NON-IMAGING

## 2018-07-01 MED ORDER — DIAZEPAM 5 MG PO TABS: 5.0000 mg | ORAL_TABLET | Freq: Once | ORAL | 0 refills | Status: DC | PRN

## 2018-07-01 NOTE — Progress Notes (Signed)
07/01/2018 3:57 PM   Curtis Bray. May 12, 1961 [id number]  Referring provider: Valerie Roys, DO [place], [place] [address]  CC: Elevated PSA  HPI: I saw Curtis Bray in urology clinic today in consultation for elevated PSA from Dr. Wynetta Emery.  He is a healthy 58 year old male with no family history of prostate cancer who has a long history of elevated PSA, most recently 6.7 in January 2020.  He has never been evaluated by a urologist before, and has never undergone prostate biopsy.  His past medical history is notable for chronic back pain and history of TIA, and past surgical history is notable for distant history of open appendectomy.  He works as a Pharmacist, hospital.  He denies gross hematuria or any significant urinary symptoms.  He has occasional urinary urgency.  IPSS score is 6.  PVR is 69 cc.  He has mild erectile dysfunction, and does not take medications for this.  There are no aggravating or alleviating factors.  Severity is moderate.   PMH: Past Medical History:  Diagnosis Date  . Allergy   . Carpal tunnel syndrome   . DDD (degenerative disc disease), cervical   . DDD (degenerative disc disease), cervical   . Edema   . GERD (gastroesophageal reflux disease)   . Hyperlipidemia   . Personal history of colonic polyps   . Sleep apnea    Bi-pap  . Sleep apnea   . TIA (transient ischemic attack) 07/2011  . Vision impairment    Blindness Right Eye    Surgical History: Past Surgical History:  Procedure Laterality Date  . APPENDECTOMY    . carpel tunnel Bilateral   . COLONOSCOPY  2015   Found 2 beign polyps, repeat 10 years  . TONSILLECTOMY     Allergies: No Known Allergies  Family History: Family History  Problem Relation Age of Onset  . Hearing loss Father   . Hypertension Father   . Pneumonia Father   . Asthma Son   . Cancer Maternal Grandmother        Colon    Social History:  reports that he quit smoking about 23 years ago. He quit after 18.00 years of  use. He has never used smokeless tobacco. He reports current alcohol use. He reports that he does not use drugs.  ROS: Please see flowsheet from today's date for complete review of systems.  Physical Exam: BP 110/65 (BP Location: Left Arm, Patient Position: Sitting, Cuff Size: Large)   Pulse 69   Ht 5' 10.5" (1.791 m)   Wt 279 lb 9.6 oz (126.8 kg)   BMI 39.55 kg/m    Constitutional:  Alert and oriented, No acute distress. Cardiovascular: No clubbing, cyanosis, or edema. Respiratory: Normal respiratory effort, no increased work of breathing. GI: Abdomen is soft, nontender, nondistended, no abdominal masses GU: No CVA tenderness DRE: 30g, limited by body habitus, smooth without nodules Lymph: No cervical or inguinal lymphadenopathy. Skin: No rashes, bruises or suspicious lesions. Neurologic: Grossly intact, no focal deficits, moving all 4 extremities. Psychiatric: Normal mood and affect.  Laboratory Data: PSA history 05/2018: 6.7 04/2018: 6.6 04/2017: 4.8 03/2017: 6.3 02/2017: 5.3 02/2016: 3.6 01/2015: 3.6  Pertinent Imaging: None to review  Assessment & Plan:   In summary, the patient is a healthy 58 year old male with no family history of prostate cancer and history of elevated PSA since October 2018.  He has never undergone prostate biopsy.  We reviewed the implications of an elevated PSA and  the uncertainty surrounding it. In general, a man's PSA increases with age and is produced by both normal and cancerous prostate tissue. The differential diagnosis for elevated PSA includes BPH, prostate cancer, infection, recent intercourse/ejaculation, recent urethroscopic manipulation (foley placement/cystoscopy) or trauma, and prostatitis.   Management of an elevated PSA can include observation or prostate biopsy and we discussed this in detail. Our goal is to detect clinically significant prostate cancers, and manage with either active surveillance, surgery, or radiation for  localized disease. Risks of prostate biopsy include bleeding, infection (including life threatening sepsis), pain, and lower urinary symptoms. Hematuria, hematospermia, and blood in the stool are all common after biopsy and can persist up to 4 weeks.   We also discussed possible role of the 4K score and free/total ratio in stratifying risk.  I recommend proceeding with biopsy with his otherwise good health and persistently elevated PSA, he is in agreement.  Schedule prostate biopsy  Billey Co, MD  [facility] [address] [place], [place] [address] [phone]

## 2018-07-01 NOTE — Patient Instructions (Signed)
  Transrectal Prostate Biopsy Patient Education and Post Procedure Instructions    -Definition A prostate biopsy is the removal of a small amount of tissue from the prostate gland. The tissue is examined to determine whether there is cancer.  -Reasons for Procedure A prostate biopsy is usually done after an abnormal finding by: Digital rectal exam Prostate specific antigen (PSA) blood test A prostate biopsy is the only way to find out if cancer cells are present.  -Possible Complications Problems from the procedure are rare, but all procedures have some risk including: Infection Bruising or lengthy bleeding from the rectum, or in urine or semen Difficulty urinating Reactions to anesthesia Factors that may increase the risk of complications include: Smoking History of bleeding disorders or easy bruising Use of any medications, over-the-counter medications, or herbal supplements Sensitivity or allergy to latex, medications, or anesthesia.  -Prior to Procedure Talk to your doctor about your medications. Blood thinning medications including aspirin should be stopped 1 week prior to procedure. If prescribed by your cardiologist we may need approval before stopping medications. Use a Fleets enema 2 hours before the procedure. Can be purchased at your pharmacy. Antibiotics will be administered in the clinic prior to procedure.  Please make sure you eat a light meal prior to coming in for your appointment. This can help prevent lightheadedness during the procedure and upset stomach from antibiotics. Please bring someone with you to the procedure to drive you home.  -Anesthesia Transrectal biopsy: Local anesthesia--Just the area that is being operated on is numbed using an injectable anesthetic.  -Description of the Procedure Transrectal biopsy--Your doctor will insert a small ultrasound device into the rectum. This device will produce sound waves to create an image of the prostate.  These images will help guide placement of the needle. Your doctor will then insert the needle through the wall of the rectum and into the prostate gland. The procedure should take approximately 15-30 minutes.  -Will It Hurt? You may have discomfort and soreness at the biopsy site. Pain and discomfort after the procedure can be managed with medications.  -Postoperative Care When you return home after the procedure, do the following to help ensure a smooth recovery: Stay hydrated. Drink plenty of fluids for the next few days. Avoid difficult physical activity the day and evening of the procedure. Keep in mind that you may see blood in your urine, stool, or semen for several days. Resume any medications that were stopped when you are advised to do so.  After the sample is taken, it will be sent to a pathologist for examination under a microscope. This doctor will analyze the sample for cancer. You will be scheduled for an appointment to discuss results. If cancer is present, your doctor will work with you to develop a treatment plan.   -Call Your Doctor or Seek Immediate Medical Attention It is important to monitor your recovery. Alert your doctor to any problems. If any of the following occur, call your doctor or go to the emergency room: Fever 100.5 or greater within 1 week post procedure go directly to ER Call the office for: Blood in the urine more than 1 week or in semen for more than 6 weeks post-biopsy Pain that you cannot control with the medications you have been given Pain, burning, urgency, or frequency of urination Cough, shortness of breath, or chest pain- if severe go to ER Heavy rectal bleeding or bleeding that lasts more than 1 week after the biopsy If you   have any questions or concerns please contact our office at [facility]  [facility] [address] [place], [place] [address] [phone]  Prostate Cancer Screening  The  prostate is a walnut-sized gland that is located below the bladder and in front of the rectum in males. The function of the prostate (prostate gland) is to add fluid to semen during ejaculation. Prostate cancer is the second most common type of cancer in men. A screening test for cancer is a test that is done before cancer symptoms start. Screening can help to identify cancer at an early stage, when the cancer can be treated more easily. The recommended prostate cancer screening test is a blood test called the prostate-specific antigen (PSA) test. PSA is a protein that is made in the prostate. As you age, your prostate naturally produces more PSA. Abnormally high PSA levels may be caused by:  Prostate cancer.  An enlarged prostate that is not caused by cancer (benign prostatic hyperplasia, BPH). This condition is very common in older men.  A prostate gland infection (prostatitis).  Medicines to assist with hair growth, such as finasteride. Depending on the PSA results, you may need more tests, such as:  A physical exam to check the size of your prostate gland.  Blood and imaging tests.  A procedure to remove tissue samples from your prostate gland for testing (biopsy). Who should have screening? Screening recommendations vary based on age.  If you are younger than age 59, screening is not recommended.  If you are age 8-54 and you have no risk factors, screening is not recommended.  If you are younger than age 11, ask your health care provider if you need screening if you have one of these risk factors: ? Being of African-American descent. ? Having a family history of prostate cancer.  If you are age 61-69, talk with your health care provider about your need for screening and how often screening should be done.  If you are older than age 53, screening is not recommended. This is because the risks that screening can cause are greater than the benefits that it may provide (risks outweigh  the benefits). If you are at high risk for prostate cancer, your health care provider may recommend that you have screenings more often or start screening at a younger age. You may be at high risk if you:  Are older than age 84.  Are African-American.  Have a father, brother, or uncle who has been diagnosed with prostate cancer. The risk may be higher if your family member's cancer occurred at an early age. What are the benefits of screening? There is a small chance that screening may lower your risk of dying from prostate cancer. The chance is small because prostate cancer is typically a slow-growing cancer, and most men with prostate cancer die from a different cause. What are the risks of screening? The main risk of prostate cancer screening is diagnosing and treating prostate cancer that would never have caused any symptoms or problems (overdiagnosis and overtreatment). PSA screening cannot tell you if your PSA is high due to cancer or a different cause. A prostate biopsy is the only procedure to diagnose prostate cancer. Even the results of a biopsy may not tell you if your cancer needs to be treated. Slow-growing prostate cancer may not need any treatment other than monitoring, so diagnosing and treating it may cause unnecessary stress or other side effects. A prostate biopsy may also cause:  Infection  or fever.  A false negative. This is a result that shows that you do not have prostate cancer when you actually do have prostate cancer. Questions to ask your health care provider  When should I start prostate cancer screening?  What is my risk for prostate cancer?  How often do I need screening?  What type of screening tests do I need?  How do I get my test results?  What do my results mean?  Do I need treatment? Contact a health care provider if:  You have difficulty urinating.  You have pain when you urinate or ejaculate.  You have blood in your urine or semen.  You  have pain in your back or in the area of your prostate.  You have trouble getting or maintaining an erection (erectile dysfunction, ED). Summary  Prostate cancer is a common type of cancer in men. The prostate (prostate gland) is located below the bladder and in front of the rectum. This gland adds fluid to semen during ejaculation.  Prostate cancer screening may identify cancer at an early stage, when the cancer can be treated more easily.  The prostate-specific antigen (PSA) test is the recommended screening test for prostate cancer.  Discuss the risks and benefits of prostate cancer screening with your health care provider. If you are age 82 or older, screening is likely to lead to more risks than benefits (risks outweigh the benefits). This information is not intended to replace advice given to you by your health care provider. Make sure you discuss any questions you have with your health care provider. Document Released: 02/20/2017 Document Revised: 02/20/2017 Document Reviewed: 02/20/2017 Elsevier Interactive Patient Education  2019 [employer].

## 2018-07-02 ENCOUNTER — Other Ambulatory Visit: Payer: Self-pay | Admitting: Family Medicine

## 2018-07-02 DIAGNOSIS — M5106 Intervertebral disc disorders with myelopathy, lumbar region: Secondary | ICD-10-CM

## 2018-07-02 DIAGNOSIS — M9903 Segmental and somatic dysfunction of lumbar region: Secondary | ICD-10-CM

## 2018-07-15 ENCOUNTER — Ambulatory Visit
Admission: RE | Admit: 2018-07-15 | Discharge: 2018-07-15 | Disposition: A | Discharge status: Home / Self Care | Payer: BC Managed Care – PPO | Source: Ambulatory Visit | Attending: Family Medicine | Admitting: Family Medicine

## 2018-07-15 DIAGNOSIS — M9903 Segmental and somatic dysfunction of lumbar region: Secondary | ICD-10-CM | POA: Diagnosis present

## 2018-07-15 DIAGNOSIS — M5106 Intervertebral disc disorders with myelopathy, lumbar region: Secondary | ICD-10-CM | POA: Diagnosis not present

## 2018-07-26 ENCOUNTER — Other Ambulatory Visit: Payer: Self-pay | Admitting: Family Medicine

## 2018-08-03 ENCOUNTER — Ambulatory Visit: Payer: BC Managed Care – PPO | Admitting: Urology

## 2018-08-03 ENCOUNTER — Encounter: Payer: Self-pay | Admitting: Urology

## 2018-08-03 ENCOUNTER — Other Ambulatory Visit: Payer: Self-pay | Admitting: Urology

## 2018-08-03 ENCOUNTER — Ambulatory Visit: Admission: RE | Admit: 2018-08-03 | Payer: BC Managed Care – PPO | Source: Ambulatory Visit | Admitting: Urology

## 2018-08-03 VITALS — BP 128/76 | HR 76 | Ht 70.5 in | Wt 283.0 lb

## 2018-08-03 DIAGNOSIS — R972 Elevated prostate specific antigen [PSA]: Secondary | ICD-10-CM

## 2018-08-03 MED ORDER — LEVOFLOXACIN 500 MG PO TABS
500.0000 mg | ORAL_TABLET | Freq: Once | ORAL | Status: AC
  Administered 2018-08-03: 500 mg via ORAL

## 2018-08-03 MED ORDER — GENTAMICIN SULFATE 40 MG/ML IJ SOLN
80.0000 mg | Freq: Once | INTRAMUSCULAR | Status: AC
  Administered 2018-08-03: 80 mg via INTRAMUSCULAR

## 2018-08-03 NOTE — Progress Notes (Signed)
   08/03/18  Indication: Elevated PSA, 6.7  Prostate Biopsy Procedure   Informed consent was obtained, and we discussed the risks of bleeding and infection/sepsis. A time out was performed to ensure correct patient identity.  Pre-Procedure: - Last PSA Level: 6.7 - DRE: Limited by body habitus, no obvious masses or nodules - Gentamicin and levaquin given for antibiotic prophylaxis - Transrectal Ultrasound performed revealing a 34 gm prostate, PSA density 0.20 - No significant median lobe. Small 1cm hypoechoic area at right base  Procedure: - Prostate block performed using 10 cc 1% lidocaine and biopsies taken from sextant areas, a total of 12 under ultrasound guidance.  Post-Procedure: - Patient tolerated the procedure well - He was counseled to seek immediate medical attention if experiences significant bleeding, fevers, or severe pain - Return in one week to discuss biopsy results  Assessment/ Plan: Will follow up in 1-2 weeks to discuss pathology  Nickolas Madrid, MD 08/03/2018

## 2018-08-07 LAB — PATHOLOGY REPORT

## 2018-08-09 ENCOUNTER — Other Ambulatory Visit: Payer: Self-pay | Admitting: Urology

## 2018-08-10 ENCOUNTER — Telehealth: Payer: Self-pay | Admitting: Urology

## 2018-08-10 NOTE — Addendum Note (Signed)
Addended by: Billey Co on: 08/10/2018 04:04 PM   Modules accepted: Orders

## 2018-08-10 NOTE — Telephone Encounter (Signed)
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  I discussed the need for staging imaging with CT and bone scan prior to in-depth discussion on treatment strategies.  Has follow up 4/1 to review biopsy and imaging results and discuss treatment strategies.  Nickolas Madrid, MD 08/10/2018

## 2018-08-19 ENCOUNTER — Ambulatory Visit: Admission: RE | Admit: 2018-08-19 | Payer: BC Managed Care – PPO | Source: Ambulatory Visit

## 2018-08-19 ENCOUNTER — Ambulatory Visit
Admission: RE | Admit: 2018-08-19 | Discharge: 2018-08-19 | Disposition: A | Discharge status: Home / Self Care | Payer: BC Managed Care – PPO | Source: Ambulatory Visit | Attending: Urology | Admitting: Urology

## 2018-08-19 ENCOUNTER — Encounter
Admission: RE | Admit: 2018-08-19 | Discharge: 2018-08-19 | Disposition: A | Discharge status: Home / Self Care | Payer: BC Managed Care – PPO | Source: Ambulatory Visit | Attending: Urology | Admitting: Urology

## 2018-08-19 ENCOUNTER — Other Ambulatory Visit: Payer: Self-pay

## 2018-08-19 DIAGNOSIS — C61 Malignant neoplasm of prostate: Secondary | ICD-10-CM | POA: Diagnosis present

## 2018-08-19 MED ORDER — IOHEXOL 300 MG/ML  SOLN
100.0000 mL | Freq: Once | INTRAMUSCULAR | Status: AC | PRN
  Administered 2018-08-19: 100 mL via INTRAVENOUS

## 2018-08-19 MED ORDER — TECHNETIUM TC 99M MEDRONATE IV KIT
20.0000 | PACK | Freq: Once | INTRAVENOUS | Status: AC | PRN
  Administered 2018-08-19: 22.21 via INTRAVENOUS

## 2018-08-25 ENCOUNTER — Ambulatory Visit: Payer: BC Managed Care – PPO | Admitting: Urology

## 2018-08-25 ENCOUNTER — Other Ambulatory Visit: Payer: Self-pay

## 2018-08-25 ENCOUNTER — Encounter: Payer: Self-pay | Admitting: Urology

## 2018-08-25 VITALS — BP 128/88 | HR 65 | Ht 70.5 in | Wt 283.0 lb

## 2018-08-25 DIAGNOSIS — C61 Malignant neoplasm of prostate: Secondary | ICD-10-CM | POA: Diagnosis not present

## 2018-08-25 NOTE — Patient Instructions (Signed)
Brachytherapy for Prostate Cancer Brachytherapy for prostate cancer is radiation treatment that is placed inside of the prostate (prostate gland). There are several types of brachytherapy:  Low-dose rate (LDR) therapy. This may involve temporary implants or permanent radioactive seed or pellet implants. The radiation does not travel far from the prostate, which means that healthy, noncancerous tissues around the prostate receive only a small dose of radiation. This helps to protect those tissues from injury. This type of treatment may be followed by a course of external beam radiation. ? Temporary low-dose implants are left in the prostate for 1-7 days. The implants are needles, applicators, or thin, plastic tubes (catheters) that contain radioactive material. You will need to stay in the hospital while the implant is in place. ? Permanent low-dose implants (seeds or pellets) are injected into the prostate, and they work for up to one year after they are inserted. They are left in place and are not removed.  High-dose rate (HDR) therapy. This is given through needles, applicators, or catheters that contain radioactive material. The tubes are removed after treatment, and no radiation is left in the prostate. This type of treatment may be followed by a course of external beam radiation. Tell a health care provider about:  Any allergies you have.  All medicines you are taking, including vitamins, herbs, eye drops, creams, and over-the-counter medicines.  Any problems you or family members have had with anesthetic medicines.  Any surgeries you have had.  Any blood disorders you have.  Any medical conditions you have. What are the risks? Generally, this is a safe procedure. However, problems may occur, including:  Inflammation of the rectum.  Problems getting or keeping an erection (erectile dysfunction).  Trouble urinating.  Diarrhea.  Bleeding.  Loss of bowel control. What happens  before the procedure? Staying hydrated Follow instructions from your health care provider about hydration, which may include:  Up to 2 hours before the procedure - you may continue to drink clear liquids, such as water, clear fruit juice, black coffee, and plain tea. Eating and drinking Follow instructions from your health care provider about eating and drinking, which may include:  8 hours before the procedure - stop eating heavy meals or foods such as meat, fried foods, or fatty foods.  6 hours before the procedure - stop eating light meals or foods, such as toast or cereal.  6 hours before the procedure - stop drinking milk or drinks that contain milk.  2 hours before the procedure - stop drinking clear liquids. Medicines  Ask your health care provider about: ? Changing or stopping your regular medicines. This is especially important if you are taking diabetes medicines or blood thinners. ? Taking medicines such as aspirin and ibuprofen. These medicines can thin your blood. Do not take these medicines before your procedure if your health care provider instructs you not to.  You may be given antibiotic medicine to help prevent infection. General instructions  Plan to have someone take you home from the hospital or clinic.  If you will be going home right after the procedure, plan to have someone with you for 24 hours.  You may have imaging tests done, including an ultrasound, CT scan, or MRI.  You may have blood tests done.  You may have a test to check the electrical signals in your heart (electrocardiogram).  You may need to take medicine to clean out your bowel (bowel prep). What happens during the procedure?  To lower your risk of  infection: ? Your health care team will wash or sanitize their hands. ? Your skin will be washed with soap. ? Hair may be removed from the surgical area.  An IV will be inserted into one of your veins.  You will be given one or more of the  following: ? A medicine to help you relax (sedative). ? A medicine to numb the area (local anesthetic). ? A medicine to make you fall asleep (general anesthetic).  You may have a thin, plastic tube (catheter) inserted to drain your bladder.  If you are receiving brachytherapy with implants: ? A needle, applicator, or catheter will be inserted into the prostate. It will be inserted through a body cavity, such as the rectum, or through the tissue between the testicles and the anus (perineum). ? An X-ray, ultrasound, MRI, or CT scan will be used to guide the catheter or applicator toward the prostate. ? Radioactive seeds, wires, or ribbons will be fed through the catheter or applicator. ? If the high-dose method is used: ? The radioactive wires or ribbons will be left in for a few minutes and then removed. ? Once the treatment is finished, the catheter or applicator will be removed. ? If the low-dose method is used, the implant will stay in place for 1-7 days. ? You will remain in the hospital while the implant is in place. ? Once the treatment is finished, the radioactive material and catheter will be removed.  If you are receiving permanent, low-dose brachytherapy: ? Small, radioactive seeds or pellets will be injected into your prostate. This may be done through a catheter, needle, or applicator. ? The catheter or applicator will be removed, leaving the seeds in the prostate. The procedure may vary among health care providers and hospitals. What happens after the procedure?  Your blood pressure, heart rate, breathing rate, and blood oxygen level will be monitored until the medicines you were given have worn off.  Do not drive for 24 hours if you were given a sedative. Summary  Brachytherapy for prostate cancer is radiation treatment placed inside of the prostate (prostate gland).  There are several types of brachytherapy for prostate cancer, including low-dose temporary treatment,  low-dose permanent treatment, and high-dose temporary treatment.  Temporary low-dose implants are left in the prostate for 1-7 days.  Permanent low-dose implants are injected into the prostate and left in place. They work for up to one year after they are inserted.  Permanent high-dose therapy is given through tubes that contain radioactive material. The tubes are removed after treatment, and no radiation is left in the prostate. This information is not intended to replace advice given to you by your health care provider. Make sure you discuss any questions you have with your health care provider. Document Released: 10/20/2005 Document Revised: 05/21/2016 Document Reviewed: 05/21/2016 Elsevier Interactive Patient Education  2019 [place] Laparoscopic Prostatectomy  Robot-assisted laparoscopic prostatectomy is a minimally invasive procedure to remove the entire prostate gland and the seminal vesicles, which are near the bladder and the prostate. This procedure is done to treat prostate cancer that has not spread to other parts of the body (has not metastasized). The goal of the procedure is to remove all cancer cells and to prevent prostate cancer from metastasizing. During your robot-assisted laparoscopic prostatectomy, your surgeon will use a thin, lighted tube with a tiny camera on the end (laparoscope). The laparoscope will allow your surgeon to do the surgery with several small incisions in the abdomen instead  of a large incision. Your surgeon will also use a robotic arm during the procedure that will be operated through a computer. During your procedure, the lymph glands in your pelvis may be removed. Lymph glands are part of your body's disease-fighting system (immune system). The lymph glands in the pelvis may be the first place that is affected by the spreading (metastasis) of prostate cancer. If your pelvic lymph glands are removed, tissue from the glands will be tested  for cancer cells. Tell a health care provider about:  Any allergies you have.  All medicines you are taking, including vitamins, herbs, eye drops, creams, and over-the-counter medicines.  Any problems you or family members have had with anesthetic medicines.  Any blood disorders you have.  Any surgeries you have had.  Any medical conditions you have.  Any prostate infections you have had. What are the risks? Generally, this is a safe procedure. However, problems may occur, including:  Infection.  Bleeding.  Allergic reactions to medicines.  Inability to control when you urinate (incontinence). This is usually a temporary effect of this procedure.  Blockage (obstruction) of the large or small intestines.  Narrowing or scarring of the urethra (stricture), which may block the flow of urine.  Inability to get or sustain an erection (erectile dysfunction).  Dry ejaculation. This is when no semen is released during orgasm.  Blood clots in the legs.  The formation of a sac (cyst) in the pelvis that is filled with fluid from the lymph glands (lymphocele). There is also a risk of damage to other structures or organs, such as:  The tubes that drain urine from the kidneys to the bladder (ureters).  The bladder.  The urethra.  The large or small intestines.  The rectum.  Vascular arteries or veins.  Nerve tissue. What happens before the procedure? Staying hydrated Follow instructions from your health care provider about hydration, which may include:  Up to 2 hours before the procedure - you may continue to drink clear liquids, such as water, clear fruit juice, black coffee, and plain tea. Eating and drinking restrictions Follow instructions from your health care provider about eating and drinking, which may include:  8 hours before the procedure - stop eating heavy meals or foods such as meat, fried foods, or fatty foods.  6 hours before the procedure - stop eating  light meals or foods, such as toast or cereal.  6 hours before the procedure - stop drinking milk or drinks that contain milk.  2 hours before the procedure - stop drinking clear liquids. Medicines  Ask your health care provider about: ? Changing or stopping your regular medicines. This is especially important if you are taking diabetes medicines or blood thinners. ? Taking medicines such as aspirin and ibuprofen. These medicines can thin your blood. Do not take these medicines before your procedure if your health care provider instructs you not to.  You may be given antibiotic medicine to help prevent infection. General instructions  Ask your health care provider how your surgical site will be marked or identified.  You may have an exam or testing.  You may have additional tests, such as a CT scan or MRI.  You may have a blood or urine sample taken. What happens during the procedure?  To reduce your risk of infection: ? Your health care team will wash or sanitize their hands. ? Your skin will be washed with soap. ? Hair will be removed from your surgical area.  An  IV tube will be inserted into one of your veins.  You will be given one or more of the following: ? A medicine to help you relax (sedative). ? A medicine to make you fall asleep (general anesthetic). ? A medicine that is injected into your spine to numb the area below and slightly above the injection site (spinal anesthetic).  A thin, flexible tube (catheter) will be inserted through your urethra and into your bladder. The catheter will drain urine from your bladder during the procedure and while you heal.  Four or five small incisions will be made in your abdomen.  The laparoscope and other surgical instruments will be put through the incisions. Your surgeon will use the laparoscope and a robotic arm to help control the surgical instruments.  Your urethra will be cut and separated from your bladder, and your  prostate and seminal vesicles will be removed.  Your pelvic lymph glands may also be removed for testing.  Your urethra will be reconnected to your bladder. This will be done so your catheter is still in place inside of your urethra.  A small tube (drain) may be placed in one or more of your incisions to help drain extra fluid from your surgical site.  Your incisions will be closed with stitches (sutures), skin glue, or adhesive strips.  Medicine may be applied to your incisions.  Bandages (dressings) will be placed over your incisions. What happens after the procedure?  Your blood pressure, heart rate, breathing rate, and blood oxygen level will be monitored until the medicines you were given have worn off.  You may continue to receive fluids and medicines through an IV tube.  You will have some pain. Pain medicines will be available to help you.  You will have a catheter to drain urine from your bladder.  You may have fluid coming from a drain in your incision.  You may have to wear compression stockings. These stockings help to prevent blood clots and reduce swelling in your legs.  You will be encouraged to move around as much as possible. This information is not intended to replace advice given to you by your health care provider. Make sure you discuss any questions you have with your health care provider. Document Released: 02/04/2012 Document Revised: 02/22/2016 Document Reviewed: 02/22/2016 Elsevier Interactive Patient Education  2019 [place] prostate is a male gland that helps make semen. Prostate cancer is when abnormal cells grow in this gland. Follow these instructions at home:  Take over-the-counter and prescription medicines only as told by your doctor.  Eat a healthy diet.  Get plenty of sleep.  Ask your doctor for help to find a support group for men with prostate cancer.  Keep all follow-up visits as told by your doctor. This  is important.  If you have to go to the hospital, let your cancer doctor (oncologist) know.  Touch, hold, hug, and caress your partner to continue to show sexual feelings. Contact a doctor if:  You have trouble peeing (urinating).  You have blood in your pee (urine).  You have pain in your hips, back, or chest. Get help right away if:  You have weakness in your legs.  You lose feeling (have numbness) in your legs.  You cannot control your pee or your poop (stool).  You have trouble breathing.  You have sudden pain in your chest.  You have chills or a fever. Summary  The prostate is a male  gland that helps make semen. Prostate cancer is when abnormal cells grow in this gland.  Ask your doctor for help to find a support group for men with prostate cancer.  Contact a doctor if you have problems peeing or have any new pain that you did not have before. This information is not intended to replace advice given to you by your health care provider. Make sure you discuss any questions you have with your health care provider. Document Released: 04/30/2009 Document Revised: 01/22/2016 Document Reviewed: 01/21/2016 Elsevier Interactive Patient Education  2019 [employer].

## 2018-08-25 NOTE — Progress Notes (Addendum)
   08/25/2018 3:11 PM   Curtis Bray. Dec 06, 1960 [id number]  Reason for visit: Discuss prostate biopsy results and CT/NM bone scan  HPI: I saw Curtis Bray back in urology clinic to review his prostate biopsy results and staging imaging.  To briefly summarize, he is a relatively healthy 58 year old male with past medical history notable for morbid obesity with a BMI of 40 (weight 285lbs), chronic back pain, and history of TIA.  He does not have any significant urinary symptoms at baseline, and he has mild to moderate erectile dysfunction.  He underwent prostate biopsy on 08/03/2018 for an elevated PSA of 6.7, which revealed a prostate volume of 34 g with a PSA density of 0.2.  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     In terms of his staging imaging, bone scan did not show any evidence of metastatic disease, however his CT showed a enlarged 1.6 cm right inguinal node worrisome for possible metastatic disease.  I recommended proceeding with a PET Axumin scan to better evaluate for metastatic disease, specifically the enlarged right inguinal node.  We discussed at length the differences between treatment strategies for localized versus metastatic prostate cancer.  We also discussed at length the impact of the COVID-19 pandemic on healthcare, specifically that the OR is only allowing urgent and emergent cases to proceed.   We also briefly discussed robotic prostatectomy and the risks and benefits.  I was very frank with the patient that I typically do not offer prostatectomy to patients with a BMI over 36, and his current BMI is 40.  We discussed the high risk of erectile dysfunction with surgery secondary to his high risk disease that would prohibit [place] from performing a nerve sparing procedure.  We also discussed the risk of incontinence.  Will call with PET Axumin scan results Anticipate ADT and referral to  radiation oncology and/or medical oncology  A total of 15 minutes were spent face-to-face with the patient, greater than 50% was spent in patient education, counseling, and coordination of care regarding new diagnosis of high risk prostate cancer and treatment options.  ADDENDUM: PET Axumin scan with no evidence of metastatic disease. We reviewed options again including radiation(XRT vs Brachy) and 2-3 years of ADT, vs prostatectomy. We discussed similar cancer outcomes, but differences in side effect profile. I again counseled him that neither my partner Dr. Erlene Quan, or I offer robotic prostatectomy to patients with morbid obesity for numerous reasons including difficulty with positioning and ventilation and higher risks of complications. I did discuss that there may be urologists at [facility] or [place] that would offer him RALP+LND, and we are more than happy to place a referral if he desires. He would like to discuss options with his wife this weekend and reach out to [place] next week.  Options include: RadOnc consult and ADT Referral to urology at [facility] for consideration of robotic prostatectomy  Billey Co, MD  [facility] [address], [place], [place] [address] [phone]

## 2018-09-13 ENCOUNTER — Telehealth: Payer: Self-pay | Admitting: Urology

## 2018-09-13 NOTE — Telephone Encounter (Signed)
How long do we think the appeal will take before we get an answer?  Nickolas Madrid, MD 09/13/2018

## 2018-09-13 NOTE — Telephone Encounter (Signed)
Spoke with patient about his PET scan and the appeal. He wants to proceed with the hormone therapy if this is something you think is best for him. He wants to know if this can be arranged and started soon? He said if you wanted to call and talk to him about it he has a class from 1-3:30 today but his free until then and after that.  Sharyn Lull

## 2018-09-21 ENCOUNTER — Other Ambulatory Visit: Payer: Self-pay

## 2018-09-21 ENCOUNTER — Encounter
Admission: RE | Admit: 2018-09-21 | Discharge: 2018-09-21 | Disposition: A | Discharge status: Home / Self Care | Payer: BC Managed Care – PPO | Source: Ambulatory Visit | Attending: Urology | Admitting: Urology

## 2018-09-21 DIAGNOSIS — C61 Malignant neoplasm of prostate: Secondary | ICD-10-CM | POA: Diagnosis present

## 2018-09-21 MED ORDER — AXUMIN (FLUCICLOVINE F 18) INJECTION
10.3700 | Freq: Once | INTRAVENOUS | Status: AC | PRN
  Administered 2018-09-21: 10.37 via INTRAVENOUS

## 2018-09-27 ENCOUNTER — Other Ambulatory Visit: Payer: Self-pay | Admitting: Urology

## 2018-09-27 DIAGNOSIS — C61 Malignant neoplasm of prostate: Secondary | ICD-10-CM

## 2018-09-27 NOTE — Telephone Encounter (Signed)
Patient called the office on Friday requesting the following: 1.  Appointment for hormone therapy injection - should this be [place] or Lupron? 2.  Referral to Dr. Baruch Gouty at the [facility]  Please advise.

## 2018-09-27 NOTE — Telephone Encounter (Signed)
Complete

## 2018-09-30 ENCOUNTER — Encounter: Payer: Self-pay | Admitting: Radiation Oncology

## 2018-09-30 ENCOUNTER — Ambulatory Visit
Admission: RE | Admit: 2018-09-30 | Discharge: 2018-09-30 | Disposition: A | Discharge status: Home / Self Care | Payer: BC Managed Care – PPO | Source: Ambulatory Visit | Attending: Radiation Oncology | Admitting: Radiation Oncology

## 2018-09-30 ENCOUNTER — Other Ambulatory Visit: Payer: Self-pay

## 2018-09-30 VITALS — BP 142/88 | HR 68 | Temp 97.9°F | Resp 18 | Wt 282.6 lb

## 2018-09-30 DIAGNOSIS — R609 Edema, unspecified: Secondary | ICD-10-CM | POA: Diagnosis not present

## 2018-09-30 DIAGNOSIS — M5030 Other cervical disc degeneration, unspecified cervical region: Secondary | ICD-10-CM | POA: Diagnosis not present

## 2018-09-30 DIAGNOSIS — E669 Obesity, unspecified: Secondary | ICD-10-CM | POA: Insufficient documentation

## 2018-09-30 DIAGNOSIS — Z6841 Body Mass Index (BMI) 40.0 and over, adult: Secondary | ICD-10-CM | POA: Diagnosis not present

## 2018-09-30 DIAGNOSIS — K219 Gastro-esophageal reflux disease without esophagitis: Secondary | ICD-10-CM | POA: Diagnosis not present

## 2018-09-30 DIAGNOSIS — Z86010 Personal history of colonic polyps: Secondary | ICD-10-CM | POA: Diagnosis not present

## 2018-09-30 DIAGNOSIS — E785 Hyperlipidemia, unspecified: Secondary | ICD-10-CM | POA: Diagnosis not present

## 2018-09-30 DIAGNOSIS — Z8673 Personal history of transient ischemic attack (TIA), and cerebral infarction without residual deficits: Secondary | ICD-10-CM | POA: Diagnosis not present

## 2018-09-30 DIAGNOSIS — Z79899 Other long term (current) drug therapy: Secondary | ICD-10-CM | POA: Insufficient documentation

## 2018-09-30 DIAGNOSIS — Z87891 Personal history of nicotine dependence: Secondary | ICD-10-CM | POA: Diagnosis not present

## 2018-09-30 DIAGNOSIS — C61 Malignant neoplasm of prostate: Secondary | ICD-10-CM | POA: Insufficient documentation

## 2018-09-30 DIAGNOSIS — G4730 Sleep apnea, unspecified: Secondary | ICD-10-CM | POA: Diagnosis not present

## 2018-09-30 DIAGNOSIS — G8929 Other chronic pain: Secondary | ICD-10-CM | POA: Diagnosis not present

## 2018-09-30 DIAGNOSIS — M549 Dorsalgia, unspecified: Secondary | ICD-10-CM | POA: Insufficient documentation

## 2018-09-30 NOTE — Consult Note (Signed)
NEW PATIENT EVALUATION  Name: Curtis Bray.  MRN: [id number]  Date:   09/30/2018     DOB: Oct 07, 1960   This 58 y.o. 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 Prostate Cancer    DIAGNOSIS: The encounter diagnosis was Malignant neoplasm of prostate ([place]).   PREVIOUS INVESTIGATIONS:  Pathology report reviewed Clinical notes reviewed CT scans and bone scan reviewed  HPI: Patient is a pleasant 58 year old male with past medical history of TIA as well as chronic back pain who presented with a PSA of 6.7.  This prompted a transrectal ultrasound-guided biopsy showing bilateral adenocarcinoma mostly Gleason 7 (3+4) although there was Gleason 8 (4+4) as well as (3+5).  Patient has very little lower urinary tract symptoms.  He had a CT scan of the abdomen pelvis showing mild diffuse bladder wall thickening no evidence of invasive or metastatic disease.  Bone scan also showed no evidence to suggest metastatic disease.  Prostate PET CT scan showed some activity in the posterior left aspect of the prostate gland consistent with adenocarcinoma.  No evidence of nodal metastasis was identified.  He did has a a groundglass nodule in the left upper lobe not favored related to prostate cancer with follow-up recommended in 6 to 12 months.  Patient has a BMI of 40.  He has been seen by urology and recommendation for nonsurgical intervention was made based on his previous history of TIA as well as his BMI.  He is seen today for radiation oncology opinion.  He is doing well.  He specifically denies bone pain any frequent frequency urgency or nocturia.  He does have slight erectile dysfunction.  PLANNED TREATMENT REGIMEN: I-125 interstitial implant  PAST MEDICAL HISTORY:  has a past  medical history of Allergy, Carpal tunnel syndrome, DDD (degenerative disc disease), cervical, DDD (degenerative disc disease), cervical, Edema, GERD (gastroesophageal reflux disease), Hyperlipidemia, Personal history of colonic polyps, Sleep apnea, Sleep apnea, TIA (transient ischemic attack) (07/2011), and Vision impairment.    PAST SURGICAL HISTORY:  Past Surgical History:  Procedure Laterality Date  . APPENDECTOMY    . carpel tunnel Bilateral   . COLONOSCOPY  2015   Found 2 beign polyps, repeat 10 years  . TONSILLECTOMY      FAMILY HISTORY: family history includes Asthma in his son; Cancer in his maternal grandmother; Hearing loss in his father; Hypertension in his father; Pneumonia in his father.  SOCIAL HISTORY:  reports that he quit smoking about 23 years ago. He quit after 18.00 years of use. He has never used smokeless tobacco. He reports current alcohol use. He reports that he does not use drugs.  ALLERGIES: Patient has no known allergies.  MEDICATIONS:  Current Outpatient Medications  Medication Sig Dispense Refill  . ASPIR-LOW 81 MG EC tablet     . Cholecalciferol (VITAMIN D-1000 MAX ST) 1000 units tablet Take by mouth.    . Coenzyme Q10 (CO Q 10) 10 MG CAPS Take 1 tablet by mouth daily.     . diazepam (VALIUM) 5 MG tablet Take 1 tablet (5 mg total) by mouth once as needed for up to 1 dose (take 30 minutes prior to prostate biopsy). 1 tablet 0  . gabapentin (NEURONTIN) 300 MG capsule TAKE 1 CAPSULE BY MOUTH AT  BEDTIME 90 capsule 1  . meloxicam (MOBIC) 15 MG tablet Take 1 tablet (15 mg total) by mouth daily. 90 tablet 3  . OMEGA-3 FATTY ACIDS PO Take by mouth.    . RABEprazole (ACIPHEX) 20 MG tablet Take 2 tablets (40 mg total) by mouth daily. 180 tablet 3  . simvastatin (ZOCOR) 40 MG tablet TAKE 1 TABLET BY MOUTH DAILY AT 6PM 90 tablet 3  . tiZANidine (ZANAFLEX) 4 MG tablet Take 1 tablet (4 mg total) by mouth at bedtime. 30 tablet 1  . traZODone (DESYREL) 50 MG tablet Take  2 tablets (100 mg total) by mouth at bedtime. 180 tablet 3  . vitamin B-12 (CYANOCOBALAMIN) 1000 MCG tablet Take by mouth.    . vitamin E 100 UNIT capsule Take by mouth daily.     No current facility-administered medications for this encounter.     ECOG PERFORMANCE STATUS:  0 - Asymptomatic  REVIEW OF SYSTEMS: Patient denies any weight loss, fatigue, weakness, fever, chills or night sweats. Patient denies any loss of vision, blurred vision. Patient denies any ringing  of the ears or hearing loss. No irregular heartbeat. Patient denies heart murmur or history of fainting. Patient denies any chest pain or pain radiating to her upper extremities. Patient denies any shortness of breath, difficulty breathing at night, cough or hemoptysis. Patient denies any swelling in the lower legs. Patient denies any nausea vomiting, vomiting of blood, or coffee ground material in the vomitus. Patient denies any stomach pain. Patient states has had normal bowel movements no significant constipation or diarrhea. Patient denies any dysuria, hematuria or significant nocturia. Patient denies any problems walking, swelling in the joints or loss of balance. Patient denies any skin changes, loss of hair or loss of weight. Patient denies any excessive worrying or anxiety or significant depression. Patient denies any problems with insomnia. Patient denies excessive thirst, polyuria, polydipsia. Patient denies any swollen glands, patient denies easy bruising or easy bleeding. Patient denies any recent infections, allergies or URI. Patient "s visual fields have not changed significantly in recent time.   PHYSICAL EXAM: BP (!) 142/88   Pulse 68   Temp 97.9 F (36.6 C)   Resp 18   Wt 282 lb 10.1 oz (128.2 kg)   BMI 39.98 kg/m  Well-developed well-nourished patient in NAD. HEENT reveals PERLA, EOMI, discs not visualized.  Oral cavity is clear. No oral mucosal lesions are identified. Neck is clear without evidence of cervical or  supraclavicular adenopathy. Lungs are clear to A&P. Cardiac examination is essentially unremarkable with regular rate and rhythm without murmur rub or thrill. Abdomen is benign with no organomegaly or masses noted. Motor sensory and DTR levels are equal and symmetric in the upper and lower extremities. Cranial nerves II through XII are grossly intact. Proprioception is intact. No peripheral adenopathy or edema is identified. No motor or sensory levels are noted. Crude visual fields are within normal range.  LABORATORY DATA: Pathology reports reviewed    RADIOLOGY RESULTS: PET scan bone scan and CT scans all reviewed and compatible with above-stated findings   IMPRESSION: Stage IIb mostly Gleason 7 (3+4) adenocarcinoma the prostate in 58 year old male  PLAN: I have run the [facility] nomogram based on the patient's factors showing only a 10% chance of lymph node involvement there was a 61% chance of extracapsular extension and 13% chance of seminal vesicle invasion.  Based on this I have gone over treatment recommendations as far as external beam IMRT radiation therapy versus I-125  interstitial implant.  I have explained to him risks and benefits of both procedures including increased lower urinary tract symptoms possible diarrhea fatigue alteration of blood counts and general anesthesia associated with the implant.  Since we are resuming cancer surgeries I believe in another month or 2 we can order a volume study in preparation for I-125 interstitial implant which the patient desires.  In the meantime I have referred him back to urology for initial Lupron injection.  Patient comprehends my treatment plan well.  I have also informed him should his implant be put off because of COVID-19 4 to 5 months we could revisit going ahead with external beam radiation.  Patient is comfortable with my treatment recommendations.  He knows to call with any concerns at any time.  I would like to take this  opportunity to thank you for allowing me to participate in the care of your patient.Noreene Filbert, MD

## 2018-10-06 ENCOUNTER — Telehealth: Payer: Self-pay

## 2018-10-06 NOTE — Telephone Encounter (Signed)
-----   Message from Billey Co, MD sent at 10/05/2018  9:05 AM EDT ----- Regarding: Lupron Please set up for first Lupron injection asap with [facility]. Total duration will be 2 years. Recommend 77mo Lupron injections. He is getting brachytherapy in June/July when able.  Nickolas Madrid, MD 10/05/2018

## 2018-10-06 NOTE — Telephone Encounter (Signed)
Staff message sent to Erlene Senters for prior authorization of Lupron.

## 2018-10-12 ENCOUNTER — Telehealth: Payer: Self-pay | Admitting: Urology

## 2018-10-12 NOTE — Telephone Encounter (Signed)
App made NO PA REQUIRED

## 2018-10-12 NOTE — Telephone Encounter (Signed)
-----   Message from Billey Co, MD sent at 10/05/2018  9:05 AM EDT ----- Regarding: Lupron Please set up for first Lupron injection asap with [facility]. Total duration will be 2 years. Recommend 23mo Lupron injections. He is getting brachytherapy in June/July when able.  Nickolas Madrid, MD 10/05/2018

## 2018-10-12 NOTE — Telephone Encounter (Signed)
NO PA REQUIRED FOR LUPRON 10-12-18 MICHELLE

## 2018-10-13 ENCOUNTER — Ambulatory Visit: Payer: BC Managed Care – PPO | Admitting: Urology

## 2018-10-13 ENCOUNTER — Other Ambulatory Visit: Payer: Self-pay

## 2018-10-13 DIAGNOSIS — C61 Malignant neoplasm of prostate: Secondary | ICD-10-CM

## 2018-10-13 MED ORDER — LEUPROLIDE ACETATE (6 MONTH) 45 MG IM KIT
45.0000 mg | PACK | Freq: Once | INTRAMUSCULAR | Status: AC
  Administered 2018-10-13: 45 mg via INTRAMUSCULAR

## 2018-10-13 NOTE — Patient Instructions (Signed)
Start Vitamin D and Calcium daily while on lupron therapy  Leuprolide depot injection What is this medicine? LEUPROLIDE (loo PROE lide) is a man-made protein that acts like a natural hormone in the body. It decreases testosterone in men and decreases estrogen in women. In men, this medicine is used to treat advanced prostate cancer. In women, some forms of this medicine may be used to treat endometriosis, uterine fibroids, or other male hormone-related problems. This medicine may be used for other purposes; ask your health care provider or pharmacist if you have questions. COMMON BRAND NAME(S): Eligard, Lupron Depot, Lupron Depot-Ped, Viadur What should I tell my health care provider before I take this medicine? They need to know if you have any of these conditions: -diabetes -heart disease or previous heart attack -high blood pressure -high cholesterol -mental illness -osteoporosis -pain or difficulty passing urine -seizures -spinal cord metastasis -stroke -suicidal thoughts, plans, or attempt; a previous suicide attempt by you or a family member -tobacco smoker -unusual vaginal bleeding (women) -an unusual or allergic reaction to leuprolide, benzyl alcohol, other medicines, foods, dyes, or preservatives -pregnant or trying to get pregnant -breast-feeding How should I use this medicine? This medicine is for injection into a muscle or for injection under the skin. It is given by a health care professional in a hospital or clinic setting. The specific product will determine how it will be given to you. Make sure you understand which product you receive and how often you will receive it. Talk to your pediatrician regarding the use of this medicine in children. Special care may be needed. Overdosage: If you think you have taken too much of this medicine contact a poison control center or emergency room at once. NOTE: This medicine is only for you. Do not share this medicine with  others. What if I miss a dose? It is important not to miss a dose. Call your doctor or health care professional if you are unable to keep an appointment. Depot injections: Depot injections are given either once-monthly, every 12 weeks, every 16 weeks, or every 24 weeks depending on the product you are prescribed. The product you are prescribed will be based on if you are male or male, and your condition. Make sure you understand your product and dosing. What may interact with this medicine? Do not take this medicine with any of the following medications: -chasteberry This medicine may also interact with the following medications: -herbal or dietary supplements, like black cohosh or DHEA -male hormones, like estrogens or progestins and birth control pills, patches, rings, or injections -male hormones, like testosterone This list may not describe all possible interactions. Give your health care provider a list of all the medicines, herbs, non-prescription drugs, or dietary supplements you use. Also tell them if you smoke, drink alcohol, or use illegal drugs. Some items may interact with your medicine. What should I watch for while using this medicine? Visit your doctor or health care professional for regular checks on your progress. During the first weeks of treatment, your symptoms may get worse, but then will improve as you continue your treatment. You may get hot flashes, increased bone pain, increased difficulty passing urine, or an aggravation of nerve symptoms. Discuss these effects with your doctor or health care professional, some of them may improve with continued use of this medicine. Male patients may experience a menstrual cycle or spotting during the first months of therapy with this medicine. If this continues, contact your doctor or health care professional.  What side effects may I notice from receiving this medicine? Side effects that you should report to your doctor or health  care professional as soon as possible: -allergic reactions like skin rash, itching or hives, swelling of the face, lips, or tongue -breathing problems -chest pain -depression or memory disorders -pain in your legs or groin -pain at site where injected or implanted -seizures -severe headache -swelling of the feet and legs -suicidal thoughts or other mood changes -visual changes -vomiting Side effects that usually do not require medical attention (report to your doctor or health care professional if they continue or are bothersome): -breast swelling or tenderness -decrease in sex drive or performance -diarrhea -hot flashes -loss of appetite -muscle, joint, or bone pains -nausea -redness or irritation at site where injected or implanted -skin problems or acne This list may not describe all possible side effects. Call your doctor for medical advice about side effects. You may report side effects to FDA at 1-800-FDA-1088. Where should I keep my medicine? This drug is given in a hospital or clinic and will not be stored at home. NOTE: This sheet is a summary. It may not cover all possible information. If you have questions about this medicine, talk to your doctor, pharmacist, or health care provider.  2019 Elsevier/Gold Standard (2015-10-25 09:45:53)

## 2018-10-13 NOTE — Progress Notes (Signed)
Urology progress note  Patient is a 58 year old male here today for initiation of ADT.  He was recently diagnosed with high risk localized prostate cancer and has elected to undergo brachytherapy with 2 years of ADT.  35-month Lupron injection given today.  We discussed the risks and side effects of Lupron and ADT at length including weight gain, hot flashes, decreased sexual function, and fatigue.  Proceed with brachytherapy with radiation oncology, follow-up in 6 months for PSA and next Lupron shot  A total of 10 minutes were spent face-to-face with the patient, greater than 50% was spent in patient education, counseling, and coordination of care regarding prostate cancer and androgen deprivation therapy.   Nickolas Madrid, MD 10/13/2018

## 2018-10-13 NOTE — Progress Notes (Signed)
Lupron IM Injection   Due to Prostate Cancer patient is present today for a Lupron Injection.  Medication: Lupron 6 month Dose: 45 mg  Location: right upper outer buttocks Lot: [id number] Exp: 10/25/2020  Patient tolerated well, no complications were noted  Performed by: Fonnie Jarvis, CMA  Follow up: 6 month

## 2018-10-27 ENCOUNTER — Other Ambulatory Visit: Payer: Self-pay | Admitting: Radiology

## 2018-10-27 ENCOUNTER — Telehealth: Payer: Self-pay | Admitting: Radiology

## 2018-10-27 DIAGNOSIS — C61 Malignant neoplasm of prostate: Secondary | ICD-10-CM

## 2018-10-27 NOTE — Telephone Encounter (Signed)
Notified patient of prostate volume study scheduled 11/15/2018 with Dr Diamantina Providence with arrival to [facility] at 7:00. Advised patient to have nothing to eat or drink after midnight on the previous night. Notified patient of COVID-19 testing to be done [facility] on 11/11/2018 between 10:00 - 12:30. Notified patient of Prostate Seed Implant scheduled 12/13/2018 with Pre-admission testing appointment followed by COVID-19 testing on 12/09/2018 at 8:00. Instructed patient to have nothing to eat or drink after midnight on the night prior to Seed Implant procedure. Questions answered. Patient expresses understanding of instructions.

## 2018-11-11 ENCOUNTER — Other Ambulatory Visit: Payer: Self-pay

## 2018-11-11 ENCOUNTER — Other Ambulatory Visit
Admission: RE | Admit: 2018-11-11 | Discharge: 2018-11-11 | Disposition: A | Discharge status: Home / Self Care | Payer: BC Managed Care – PPO | Source: Ambulatory Visit | Attending: Urology | Admitting: Urology

## 2018-11-11 DIAGNOSIS — Z1159 Encounter for screening for other viral diseases: Secondary | ICD-10-CM | POA: Diagnosis present

## 2018-11-12 LAB — NOVEL CORONAVIRUS, NAA (HOSP ORDER, SEND-OUT TO REF LAB; TAT 18-24 HRS): SARS-CoV-2, NAA: NOT DETECTED

## 2018-11-15 ENCOUNTER — Ambulatory Visit
Admission: RE | Admit: 2018-11-15 | Discharge: 2018-11-15 | Disposition: A | Discharge status: Home / Self Care | Payer: BC Managed Care – PPO | Source: Ambulatory Visit | Attending: Radiation Oncology | Admitting: Radiation Oncology

## 2018-11-15 ENCOUNTER — Encounter: Payer: Self-pay | Admitting: Radiation Oncology

## 2018-11-15 ENCOUNTER — Ambulatory Visit
Admission: RE | Admit: 2018-11-15 | Disposition: A | Discharge status: Home / Self Care | Payer: BC Managed Care – PPO | Source: Home / Self Care | Attending: Urology | Admitting: Urology

## 2018-11-15 VITALS — BP 127/84 | HR 63 | Temp 97.3°F | Resp 18 | Wt 281.9 lb

## 2018-11-15 DIAGNOSIS — R51 Headache: Secondary | ICD-10-CM | POA: Insufficient documentation

## 2018-11-15 DIAGNOSIS — K219 Gastro-esophageal reflux disease without esophagitis: Secondary | ICD-10-CM | POA: Insufficient documentation

## 2018-11-15 DIAGNOSIS — Z79899 Other long term (current) drug therapy: Secondary | ICD-10-CM | POA: Insufficient documentation

## 2018-11-15 DIAGNOSIS — Z8673 Personal history of transient ischemic attack (TIA), and cerebral infarction without residual deficits: Secondary | ICD-10-CM | POA: Diagnosis not present

## 2018-11-15 DIAGNOSIS — C61 Malignant neoplasm of prostate: Secondary | ICD-10-CM | POA: Insufficient documentation

## 2018-11-15 DIAGNOSIS — Z86010 Personal history of colonic polyps: Secondary | ICD-10-CM | POA: Insufficient documentation

## 2018-11-15 DIAGNOSIS — M5030 Other cervical disc degeneration, unspecified cervical region: Secondary | ICD-10-CM | POA: Diagnosis not present

## 2018-11-15 DIAGNOSIS — Z6841 Body Mass Index (BMI) 40.0 and over, adult: Secondary | ICD-10-CM | POA: Diagnosis not present

## 2018-11-15 DIAGNOSIS — E785 Hyperlipidemia, unspecified: Secondary | ICD-10-CM | POA: Diagnosis not present

## 2018-11-15 DIAGNOSIS — R5383 Other fatigue: Secondary | ICD-10-CM | POA: Insufficient documentation

## 2018-11-15 DIAGNOSIS — Z87891 Personal history of nicotine dependence: Secondary | ICD-10-CM | POA: Diagnosis not present

## 2018-11-15 DIAGNOSIS — Z809 Family history of malignant neoplasm, unspecified: Secondary | ICD-10-CM | POA: Insufficient documentation

## 2018-11-15 DIAGNOSIS — G4730 Sleep apnea, unspecified: Secondary | ICD-10-CM | POA: Diagnosis not present

## 2018-11-15 SURGERY — ULTRASOUND, PROSTATE, FOR VOLUME DETERMINATION
Anesthesia: Choice

## 2018-11-15 NOTE — H&P (Signed)
History and physical  Name: Curtis Bray.  MRN: [id number]  Date:   11/15/2018     DOB: 1961-05-25   This 58 y.o. male patient presents to the hospital for volume study in anticipation of I-125 interstitial implant REFERRING PHYSICIAN: Valerie Roys, DO  CHIEF COMPLAINT:  Chief Complaint  Patient presents with  . Prostate Cancer    Post volume study    DIAGNOSIS: The encounter diagnosis was Malignant neoplasm of prostate ([place]).   PREVIOUS INVESTIGATIONS:  Clinical notes reviewed Volume study performed  HPI: Patient is a 58 year old male initially consulted back in May 2020 for a Gleason 7 (3+4) presenting with a PSA of 6.7.  He was recommended for I-125 interstitial implant after bone scan showed no evidence to suggest metastatic disease and CT scan showed no evidence of metastatic disease to lymph nodes.  He has a BMI of 40 based on this he was not recommended for surgical intervention.  Also has a history of TIA as well.  He is started on Lupron he has been having some headaches associated with that also low energy.  Seen today for volume study and anticipation of full I 125 interstitial implant.  PLANNED TREATMENT REGIMEN: I-125 interstitial implant  PAST MEDICAL HISTORY:  has a past medical history of Allergy, Carpal tunnel syndrome, DDD (degenerative disc disease), cervical, DDD (degenerative disc disease), cervical, Edema, GERD (gastroesophageal reflux disease), Hyperlipidemia, Personal history of colonic polyps, Sleep apnea, Sleep apnea, TIA (transient ischemic attack) (07/2011), and Vision impairment.    PAST SURGICAL HISTORY:  Past Surgical History:  Procedure Laterality Date  . APPENDECTOMY    . carpel tunnel Bilateral   . COLONOSCOPY  2015   Found 2 beign polyps, repeat 10 years  . TONSILLECTOMY      FAMILY HISTORY: family history includes Asthma in his son; Cancer in his maternal grandmother; Hearing loss in his father; Hypertension in his father; Pneumonia  in his father.  SOCIAL HISTORY:  reports that he quit smoking about 23 years ago. He quit after 18.00 years of use. He has never used smokeless tobacco. He reports current alcohol use. He reports that he does not use drugs.  ALLERGIES: Patient has no known allergies.  MEDICATIONS:  Current Outpatient Medications  Medication Sig Dispense Refill  . CALCIUM PO Take 1 tablet by mouth 2 (two) times a day.    . Cholecalciferol (VITAMIN D-3) 125 MCG (5000 UT) TABS Take 5,000 Units by mouth at bedtime.    . Coenzyme Q10 (COQ10) 200 MG CAPS Take 200 mg by mouth at bedtime.    . gabapentin (NEURONTIN) 300 MG capsule TAKE 1 CAPSULE BY MOUTH AT BEDTIME (Patient taking differently: Take 300 mg by mouth at bedtime. ) 90 capsule 1  . ibuprofen (ADVIL) 200 MG tablet Take 800 mg by mouth every 8 (eight) hours as needed (pain.).    Marland Kitchen meloxicam (MOBIC) 15 MG tablet Take 1 tablet (15 mg total) by mouth daily. (Patient taking differently: Take 15 mg by mouth at bedtime. ) 90 tablet 3  . pseudoephedrine (SUDAFED) 30 MG tablet Take 60 mg by mouth at bedtime.    . RABEprazole (ACIPHEX) 20 MG tablet Take 2 tablets (40 mg total) by mouth daily. (Patient taking differently: Take 20 mg by mouth daily as needed (hearburn/indigestion). ) 180 tablet 3  . simvastatin (ZOCOR) 40 MG tablet TAKE 1 TABLET BY MOUTH DAILY AT 6PM (Patient taking differently: Take 40 mg by mouth at bedtime. ) 90 tablet 3  .  traZODone (DESYREL) 50 MG tablet Take 2 tablets (100 mg total) by mouth at bedtime. 180 tablet 3  . vitamin B-12 (CYANOCOBALAMIN) 1000 MCG tablet Take 1,000 mcg by mouth at bedtime.    . ASPIR-LOW 81 MG EC tablet     . diazepam (VALIUM) 5 MG tablet Take 1 tablet (5 mg total) by mouth once as needed for up to 1 dose (take 30 minutes prior to prostate biopsy). (Patient not taking: Reported on 11/10/2018) 1 tablet 0  . tiZANidine (ZANAFLEX) 4 MG tablet Take 1 tablet (4 mg total) by mouth at bedtime. (Patient not taking: Reported on  11/10/2018) 30 tablet 1   No current facility-administered medications for this encounter.     ECOG PERFORMANCE STATUS:  0 - Asymptomatic  REVIEW OF SYSTEMS: Patient denies any weight loss, fatigue, weakness, fever, chills or night sweats. Patient denies any loss of vision, blurred vision. Patient denies any ringing  of the ears or hearing loss. No irregular heartbeat. Patient denies heart murmur or history of fainting. Patient denies any chest pain or pain radiating to her upper extremities. Patient denies any shortness of breath, difficulty breathing at night, cough or hemoptysis. Patient denies any swelling in the lower legs. Patient denies any nausea vomiting, vomiting of blood, or coffee ground material in the vomitus. Patient denies any stomach pain. Patient states has had normal bowel movements no significant constipation or diarrhea. Patient denies any dysuria, hematuria or significant nocturia. Patient denies any problems walking, swelling in the joints or loss of balance. Patient denies any skin changes, loss of hair or loss of weight. Patient denies any excessive worrying or anxiety or significant depression. Patient denies any problems with insomnia. Patient denies excessive thirst, polyuria, polydipsia. Patient denies any swollen glands, patient denies easy bruising or easy bleeding. Patient denies any recent infections, allergies or URI. Patient "s visual fields have not changed significantly in recent time.   PHYSICAL EXAM: BP 127/84 (BP Location: Left Arm, Patient Position: Sitting)   Pulse 63   Temp (!) 97.3 F (36.3 C) (Tympanic)   Resp 18   Wt 281 lb 13.7 oz (127.9 kg)   BMI 39.87 kg/m  Obese male in NAD.  Well-developed well-nourished patient in NAD. HEENT reveals PERLA, EOMI, discs not visualized.  Oral cavity is clear. No oral mucosal lesions are identified. Neck is clear without evidence of cervical or supraclavicular adenopathy. Lungs are clear to A&P. Cardiac examination is  essentially unremarkable with regular rate and rhythm without murmur rub or thrill. Abdomen is benign with no organomegaly or masses noted. Motor sensory and DTR levels are equal and symmetric in the upper and lower extremities. Cranial nerves II through XII are grossly intact. Proprioception is intact. No peripheral adenopathy or edema is identified. No motor or sensory levels are noted. Crude visual fields are within normal range.  LABORATORY DATA: Pathology report reviewed    RADIOLOGY RESULTS: Volume study used for treatment planning purposes   IMPRESSION: Stage IIb Gleason 7 adenocarcinoma the prostate in 58 year old male for anticipated I-125 interstitial implant and volume study today  PLAN: Patient is now cleared to go ahead with radiation I-125 interstitial implant.  Risks and benefits of treatment including radiation safety precautions were all reviewed with the patient.  He comprehends her treatment plan well.  I have since suggested nonsteroidals for his headaches and to contact his family doctor should they persist.  Otherwise I am pleased with his overall progress.  He underwent volume study today successfully.  Interstitial implant has been planned.  I would like to take this opportunity to thank you for allowing me to participate in the care of your patient.Noreene Filbert, MD

## 2018-11-15 NOTE — Progress Notes (Signed)
Radiation Oncology Follow up Note  Name: Curtis Bray.   Date:   11/15/2018 MRN:  [id number] DOB: May 26, 1961    This 58 y.o. male presents to the clinic today for  Patient is a 58 year old male initially consulted back in May 2020 for a Gleason 7 (3+4) presenting with a PSA of 6.7.  He was recommended for I-125 interstitial implant after bone scan showed no evidence to suggest metastatic disease and CT scan showed no evidence of metastatic disease to lymph nodes.  He has a BMI of 40 based on this he was not recommended for surgical intervention.  Also has a history of TIA as well.  He is started on Lupron he has been having some headaches associated with that also low energy.  Seen today for volume study and anticipation of full I 125 interstitial implant.Marland Kitchen  REFERRING PROVIDER: Billey Co, MD  HPI: As above.  COMPLICATIONS OF TREATMENT: none  FOLLOW UP COMPLIANCE: keeps appointments   PHYSICAL EXAM:  There were no vitals taken for this visit. Well-developed well-nourished patient in NAD. HEENT reveals PERLA, EOMI, discs not visualized.  Oral cavity is clear. No oral mucosal lesions are identified. Neck is clear without evidence of cervical or supraclavicular adenopathy. Lungs are clear to A&P. Cardiac examination is essentially unremarkable with regular rate and rhythm without murmur rub or thrill. Abdomen is benign with no organomegaly or masses noted. Motor sensory and DTR levels are equal and symmetric in the upper and lower extremities. Cranial nerves II through XII are grossly intact. Proprioception is intact. No peripheral adenopathy or edema is identified. No motor or sensory levels are noted. Crude visual fields are within normal range.  RADIOLOGY RESULTS: Ultrasound used for volume study  PLAN: Patient was taken to the cystoscopy suite in the OR. Patient was placed in the low lithotomy position. Foley catheter was placed. Trans-rectal ultrasound probe was inserted into the  rectum and prostate seminal vesicles were visualized as well as bladder base. stepping images were performed on a 5 mm increments. Images will be placed in BrachyVision treatment planning system to determine seed placement coordinates for eventual I-125 interstitial implant. Images will be reviewed with the physics and dosimetry staff for final quality approval. I personally was present for the volume study and assisted in delineation of contour volumes.  At the end of the procedure Foley catheter was removed, rectal ultrasound probe was removed. Patient tolerated his procedures extremely well with no side effects or complaints. Patient has given appointment for interstitial implant date. Consent was signed today as well as history and physical performed in preparation for his outpatient surgical implant.     Noreene Filbert, MD

## 2018-11-17 MED ORDER — SUCCINYLCHOLINE CHLORIDE 20 MG/ML IJ SOLN
INTRAMUSCULAR | Status: AC
  Filled 2018-11-17: qty 1

## 2018-11-17 MED ORDER — PROPOFOL 500 MG/50ML IV EMUL
INTRAVENOUS | Status: AC
  Filled 2018-11-17: qty 250

## 2018-11-17 MED ORDER — LIDOCAINE HCL (PF) 2 % IJ SOLN
INTRAMUSCULAR | Status: AC
  Filled 2018-11-17: qty 20

## 2018-11-17 MED ORDER — EPHEDRINE SULFATE 50 MG/ML IJ SOLN
INTRAMUSCULAR | Status: AC
  Filled 2018-11-17: qty 1

## 2018-11-17 MED ORDER — PHENYLEPHRINE HCL (PRESSORS) 10 MG/ML IV SOLN
INTRAVENOUS | Status: AC
  Filled 2018-11-17: qty 1

## 2018-11-17 MED ORDER — GLYCOPYRROLATE 0.2 MG/ML IJ SOLN
INTRAMUSCULAR | Status: AC
  Filled 2018-11-17: qty 1

## 2018-12-09 ENCOUNTER — Encounter
Admission: RE | Admit: 2018-12-09 | Discharge: 2018-12-09 | Disposition: A | Discharge status: Home / Self Care | Payer: BC Managed Care – PPO | Source: Ambulatory Visit | Attending: Urology | Admitting: Urology

## 2018-12-09 ENCOUNTER — Other Ambulatory Visit: Payer: Self-pay

## 2018-12-09 DIAGNOSIS — G4730 Sleep apnea, unspecified: Secondary | ICD-10-CM | POA: Diagnosis not present

## 2018-12-09 DIAGNOSIS — Z1159 Encounter for screening for other viral diseases: Secondary | ICD-10-CM | POA: Diagnosis not present

## 2018-12-09 DIAGNOSIS — Z01818 Encounter for other preprocedural examination: Secondary | ICD-10-CM | POA: Diagnosis not present

## 2018-12-09 LAB — CBC
HCT: 44 % (ref 39.0–52.0)
Hemoglobin: 14.7 g/dL (ref 13.0–17.0)
MCH: 29.3 pg (ref 26.0–34.0)
MCHC: 33.4 g/dL (ref 30.0–36.0)
MCV: 87.8 fL (ref 80.0–100.0)
Platelets: 177 10*3/uL (ref 150–400)
RBC: 5.01 MIL/uL (ref 4.22–5.81)
RDW: 12.4 % (ref 11.5–15.5)
WBC: 5.5 10*3/uL (ref 4.0–10.5)
nRBC: 0 % (ref 0.0–0.2)

## 2018-12-09 LAB — SARS CORONAVIRUS 2 (TAT 6-24 HRS): SARS Coronavirus 2: NEGATIVE

## 2018-12-09 NOTE — Patient Instructions (Signed)
Your procedure is scheduled on: 12/13/18 Report to Day Surgery.[facility] SECOND FLOOR To find out your arrival time please call [phone] between 1PM - 3PM on 12/10/18.  Remember: Instructions that are not followed completely may result in serious medical risk,  up to and including death, or upon the discretion of your surgeon and anesthesiologist your  surgery may need to be rescheduled.     _X__ 1. Do not eat food after midnight the night before your procedure.                 No gum chewing or hard candies. You may drink clear liquids up to 2 hours                 before you are scheduled to arrive for your surgery- DO not drink clear                 liquids within 2 hours of the start of your surgery.                 Clear Liquids include:  water, apple juice without pulp, clear carbohydrate                 drink such as Clearfast of Gatorade, Black Coffee or Tea (Do not add                 anything to coffee or tea).  __X__2.  On the morning of surgery brush your teeth with toothpaste and water, you                may rinse your mouth with mouthwash if you wish.  Do not swallow any toothpaste of mouthwash.     _X__ 3.  No Alcohol for 24 hours before or after surgery.   _X__ 4.  Do Not Smoke or use e-cigarettes For 24 Hours Prior to Your Surgery.                 Do not use any chewable tobacco products for at least 6 hours prior to                 surgery.  ____  5.  Bring all medications with you on the day of surgery if instructed.   ___X_  6.  Notify your doctor if there is any change in your medical condition      (cold, fever, infections).     Do not wear jewelry, make-up, hairpins, clips or nail polish. Do not wear lotions, powders, or perfumes. You may wear deodorant. Do not shave 48 hours prior to surgery. Men may shave face and neck. Do not bring valuables to the hospital.    [facility] is not responsible for any belongings or  valuables.  Contacts, dentures or bridgework may not be worn into surgery. Leave your suitcase in the car. After surgery it may be brought to your room. For patients admitted to the hospital, discharge time is determined by your treatment team.   Patients discharged the day of surgery will not be allowed to drive home.   Please read over the following fact sheets that you were given:   Surgical Site Infection Prevention          _X___ Take these medicines the morning of surgery with A SIP OF WATER:    1. ACIPHEX  2.   3.   4.  5.  6.  ____ Fleet Enema (as directed)   ____  Use CHG Soap as directed  ____ Use inhalers on the day of surgery  ____ Stop metformin 2 days prior to surgery    ____ Take 1/2 of usual insulin dose the night before surgery. No insulin the morning          of surgery.   __X__ Stop Coumadin/Plavix/aspirin on    STOP 12/10/18  _X___ Stop Anti-inflammatories on  STOP ADVIL AND MOBIC 12/10/18   ____ Stop supplements until after surgery.    __X__ Bring C-Pap to the hospital.

## 2018-12-10 ENCOUNTER — Encounter: Payer: Self-pay | Admitting: Anesthesiology

## 2018-12-12 MED ORDER — CIPROFLOXACIN IN D5W 400 MG/200ML IV SOLN
400.0000 mg | INTRAVENOUS | Status: AC
  Administered 2018-12-13: 400 mg via INTRAVENOUS

## 2018-12-13 ENCOUNTER — Ambulatory Visit: Payer: BC Managed Care – PPO | Admitting: Anesthesiology

## 2018-12-13 ENCOUNTER — Telehealth: Payer: Self-pay | Admitting: Urology

## 2018-12-13 ENCOUNTER — Encounter
Admission: RE | Disposition: A | Discharge status: Home / Self Care | Payer: Self-pay | Source: Home / Self Care | Attending: Urology

## 2018-12-13 ENCOUNTER — Ambulatory Visit
Admission: RE | Admit: 2018-12-13 | Discharge: 2018-12-13 | Disposition: A | Discharge status: Home / Self Care | Payer: BC Managed Care – PPO | Attending: Urology | Admitting: Urology

## 2018-12-13 ENCOUNTER — Encounter: Payer: Self-pay | Admitting: *Deleted

## 2018-12-13 ENCOUNTER — Other Ambulatory Visit: Payer: Self-pay

## 2018-12-13 ENCOUNTER — Ambulatory Visit
Admission: RE | Admit: 2018-12-13 | Discharge: 2018-12-13 | Disposition: A | Discharge status: Home / Self Care | Payer: BC Managed Care – PPO | Source: Ambulatory Visit | Attending: Radiation Oncology | Admitting: Radiation Oncology

## 2018-12-13 DIAGNOSIS — Z6839 Body mass index (BMI) 39.0-39.9, adult: Secondary | ICD-10-CM | POA: Diagnosis not present

## 2018-12-13 DIAGNOSIS — Z825 Family history of asthma and other chronic lower respiratory diseases: Secondary | ICD-10-CM | POA: Insufficient documentation

## 2018-12-13 DIAGNOSIS — G4730 Sleep apnea, unspecified: Secondary | ICD-10-CM | POA: Insufficient documentation

## 2018-12-13 DIAGNOSIS — H5461 Unqualified visual loss, right eye, normal vision left eye: Secondary | ICD-10-CM | POA: Insufficient documentation

## 2018-12-13 DIAGNOSIS — G709 Myoneural disorder, unspecified: Secondary | ICD-10-CM | POA: Diagnosis not present

## 2018-12-13 DIAGNOSIS — C61 Malignant neoplasm of prostate: Secondary | ICD-10-CM | POA: Diagnosis not present

## 2018-12-13 DIAGNOSIS — M5030 Other cervical disc degeneration, unspecified cervical region: Secondary | ICD-10-CM | POA: Diagnosis not present

## 2018-12-13 DIAGNOSIS — E785 Hyperlipidemia, unspecified: Secondary | ICD-10-CM | POA: Insufficient documentation

## 2018-12-13 DIAGNOSIS — Z820 Family history of epilepsy and other diseases of the nervous system: Secondary | ICD-10-CM | POA: Insufficient documentation

## 2018-12-13 DIAGNOSIS — Z800 Family history of malignant neoplasm of digestive organs: Secondary | ICD-10-CM | POA: Insufficient documentation

## 2018-12-13 DIAGNOSIS — G5600 Carpal tunnel syndrome, unspecified upper limb: Secondary | ICD-10-CM | POA: Insufficient documentation

## 2018-12-13 DIAGNOSIS — K219 Gastro-esophageal reflux disease without esophagitis: Secondary | ICD-10-CM | POA: Insufficient documentation

## 2018-12-13 DIAGNOSIS — Z8673 Personal history of transient ischemic attack (TIA), and cerebral infarction without residual deficits: Secondary | ICD-10-CM | POA: Diagnosis not present

## 2018-12-13 DIAGNOSIS — Z87891 Personal history of nicotine dependence: Secondary | ICD-10-CM | POA: Diagnosis not present

## 2018-12-13 DIAGNOSIS — Z8249 Family history of ischemic heart disease and other diseases of the circulatory system: Secondary | ICD-10-CM | POA: Diagnosis not present

## 2018-12-13 DIAGNOSIS — Z86010 Personal history of colonic polyps: Secondary | ICD-10-CM | POA: Insufficient documentation

## 2018-12-13 HISTORY — PX: RADIOACTIVE SEED IMPLANT: SHX5150

## 2018-12-13 SURGERY — INSERTION, RADIATION SOURCE, PROSTATE
Anesthesia: General

## 2018-12-13 MED ORDER — SUGAMMADEX SODIUM 200 MG/2ML IV SOLN
INTRAVENOUS | Status: AC
  Filled 2018-12-13: qty 2

## 2018-12-13 MED ORDER — ROCURONIUM BROMIDE 50 MG/5ML IV SOLN
INTRAVENOUS | Status: AC
  Filled 2018-12-13: qty 1

## 2018-12-13 MED ORDER — FLEET ENEMA 7-19 GM/118ML RE ENEM: 1.0000 | ENEMA | Freq: Once | RECTAL | Status: DC

## 2018-12-13 MED ORDER — FENTANYL CITRATE (PF) 100 MCG/2ML IJ SOLN
INTRAMUSCULAR | Status: AC
  Filled 2018-12-13: qty 2

## 2018-12-13 MED ORDER — HYDROCODONE-ACETAMINOPHEN 5-325 MG PO TABS: 1.0000 | ORAL_TABLET | ORAL | 0 refills | Status: AC | PRN

## 2018-12-13 MED ORDER — HYDROCODONE-ACETAMINOPHEN 5-325 MG PO TABS
ORAL_TABLET | ORAL | Status: AC
  Filled 2018-12-13: qty 1

## 2018-12-13 MED ORDER — MIDAZOLAM HCL 2 MG/2ML IJ SOLN
INTRAMUSCULAR | Status: AC
  Filled 2018-12-13: qty 2

## 2018-12-13 MED ORDER — PROPOFOL 10 MG/ML IV BOLUS
INTRAVENOUS | Status: AC
  Filled 2018-12-13: qty 20

## 2018-12-13 MED ORDER — LIDOCAINE HCL (CARDIAC) PF 100 MG/5ML IV SOSY
PREFILLED_SYRINGE | INTRAVENOUS | Status: DC | PRN
  Administered 2018-12-13: 100 mg via INTRAVENOUS

## 2018-12-13 MED ORDER — LIDOCAINE HCL 4 % MT SOLN
OROMUCOSAL | Status: DC | PRN
  Administered 2018-12-13: 4 mL via TOPICAL

## 2018-12-13 MED ORDER — DEXAMETHASONE SODIUM PHOSPHATE 10 MG/ML IJ SOLN
INTRAMUSCULAR | Status: AC
  Filled 2018-12-13: qty 1

## 2018-12-13 MED ORDER — LACTATED RINGERS IV SOLN
INTRAVENOUS | Status: DC
  Administered 2018-12-13: 07:00:00 via INTRAVENOUS

## 2018-12-13 MED ORDER — CIPROFLOXACIN IN D5W 400 MG/200ML IV SOLN
INTRAVENOUS | Status: AC
  Filled 2018-12-13: qty 200

## 2018-12-13 MED ORDER — ONDANSETRON HCL 4 MG/2ML IJ SOLN
INTRAMUSCULAR | Status: AC
  Filled 2018-12-13: qty 2

## 2018-12-13 MED ORDER — FENTANYL CITRATE (PF) 100 MCG/2ML IJ SOLN
25.0000 ug | INTRAMUSCULAR | Status: DC | PRN
  Administered 2018-12-13: 25 ug via INTRAVENOUS

## 2018-12-13 MED ORDER — ONDANSETRON HCL 4 MG/2ML IJ SOLN
INTRAMUSCULAR | Status: DC | PRN
  Administered 2018-12-13: 4 mg via INTRAVENOUS

## 2018-12-13 MED ORDER — DEXAMETHASONE SODIUM PHOSPHATE 10 MG/ML IJ SOLN
INTRAMUSCULAR | Status: DC | PRN
  Administered 2018-12-13: 10 mg via INTRAVENOUS

## 2018-12-13 MED ORDER — HYDROCODONE-ACETAMINOPHEN 5-325 MG PO TABS
1.0000 | ORAL_TABLET | Freq: Four times a day (QID) | ORAL | Status: DC | PRN
  Administered 2018-12-13: 1 via ORAL

## 2018-12-13 MED ORDER — ONDANSETRON HCL 4 MG/2ML IJ SOLN: 4.0000 mg | Freq: Once | INTRAMUSCULAR | Status: DC | PRN

## 2018-12-13 MED ORDER — BACITRACIN ZINC 500 UNIT/GM EX OINT
TOPICAL_OINTMENT | CUTANEOUS | Status: AC
  Filled 2018-12-13: qty 28.35

## 2018-12-13 MED ORDER — SUGAMMADEX SODIUM 200 MG/2ML IV SOLN
INTRAVENOUS | Status: DC | PRN
  Administered 2018-12-13: 200 mg via INTRAVENOUS

## 2018-12-13 MED ORDER — ROCURONIUM BROMIDE 100 MG/10ML IV SOLN
INTRAVENOUS | Status: DC | PRN
  Administered 2018-12-13: 20 mg via INTRAVENOUS
  Administered 2018-12-13: 10 mg via INTRAVENOUS
  Administered 2018-12-13: 40 mg via INTRAVENOUS

## 2018-12-13 MED ORDER — MIDAZOLAM HCL 2 MG/2ML IJ SOLN
INTRAMUSCULAR | Status: DC | PRN
  Administered 2018-12-13: 2 mg via INTRAVENOUS

## 2018-12-13 MED ORDER — SUCCINYLCHOLINE CHLORIDE 20 MG/ML IJ SOLN
INTRAMUSCULAR | Status: AC
  Filled 2018-12-13: qty 1

## 2018-12-13 MED ORDER — SUCCINYLCHOLINE CHLORIDE 20 MG/ML IJ SOLN
INTRAMUSCULAR | Status: DC | PRN
  Administered 2018-12-13: 140 mg via INTRAVENOUS

## 2018-12-13 MED ORDER — PROPOFOL 10 MG/ML IV BOLUS
INTRAVENOUS | Status: DC | PRN
  Administered 2018-12-13: 150 mg via INTRAVENOUS

## 2018-12-13 MED ORDER — LIDOCAINE HCL (PF) 2 % IJ SOLN
INTRAMUSCULAR | Status: AC
  Filled 2018-12-13: qty 10

## 2018-12-13 MED ORDER — FENTANYL CITRATE (PF) 100 MCG/2ML IJ SOLN
INTRAMUSCULAR | Status: DC | PRN
  Administered 2018-12-13 (×2): 50 ug via INTRAVENOUS

## 2018-12-13 MED ORDER — CIPROFLOXACIN HCL 500 MG PO TABS: 500.0000 mg | ORAL_TABLET | Freq: Two times a day (BID) | ORAL | 0 refills | Status: AC

## 2018-12-13 SURGICAL SUPPLY — 27 items
BAG URINE DRAINAGE (UROLOGICAL SUPPLIES) ×3 IMPLANT
BLADE CLIPPER SURG (BLADE) ×3 IMPLANT
BRUSH SCRUB EZ 1% IODOPHOR (MISCELLANEOUS) ×3 IMPLANT
CATH FOL 2WAY LX 16X5 (CATHETERS) ×3 IMPLANT
COVER BACK TABLE REUSABLE LG (DRAPES) ×3 IMPLANT
DRAPE INCISE 23X17 IOBAN STRL (DRAPES) ×2
DRAPE INCISE 23X17 STRL (DRAPES) ×1 IMPLANT
DRAPE INCISE IOBAN 23X17 STRL (DRAPES) ×1 IMPLANT
DRAPE UNDER BUTTOCK W/FLU (DRAPES) ×3 IMPLANT
DRSG TELFA 3X8 NADH (GAUZE/BANDAGES/DRESSINGS) ×3 IMPLANT
GLOVE BIO SURGEON STRL SZ7.5 (GLOVE) ×3 IMPLANT
GLOVE BIOGEL PI IND STRL 7.5 (GLOVE) ×2 IMPLANT
GLOVE BIOGEL PI INDICATOR 7.5 (GLOVE) ×4
GOWN STRL REUS W/ TWL LRG LVL3 (GOWN DISPOSABLE) ×1 IMPLANT
GOWN STRL REUS W/ TWL XL LVL3 (GOWN DISPOSABLE) ×2 IMPLANT
GOWN STRL REUS W/TWL LRG LVL3 (GOWN DISPOSABLE) ×2
GOWN STRL REUS W/TWL XL LVL3 (GOWN DISPOSABLE) ×4
IV NS 1000ML (IV SOLUTION) ×2
IV NS 1000ML BAXH (IV SOLUTION) ×1 IMPLANT
KIT TURNOVER CYSTO (KITS) ×3 IMPLANT
PACK CYSTO AR (MISCELLANEOUS) ×3 IMPLANT
PAD DRESSING TELFA 3X8 NADH (GAUZE/BANDAGES/DRESSINGS) ×1 IMPLANT
SET CYSTO W/LG BORE CLAMP LF (SET/KITS/TRAYS/PACK) ×3 IMPLANT
SURGILUBE 2OZ TUBE FLIPTOP (MISCELLANEOUS) ×3 IMPLANT
SYR 10ML LL (SYRINGE) ×3 IMPLANT
TAPE TRANSPORE STRL 2 31045 (GAUZE/BANDAGES/DRESSINGS) ×2 IMPLANT
WATER STERILE IRR 1000ML POUR (IV SOLUTION) ×3 IMPLANT

## 2018-12-13 NOTE — Discharge Instructions (Signed)

## 2018-12-13 NOTE — Anesthesia Preprocedure Evaluation (Addendum)
Anesthesia Evaluation  Patient identified by MRN, date of birth, ID band Patient awake    Reviewed: Allergy & Precautions, NPO status , Patient's Chart, lab work & pertinent test results, reviewed documented beta blocker date and time   Airway Mallampati: III  TM Distance: >3 FB     Dental  (+) Chipped   Pulmonary sleep apnea and Continuous Positive Airway Pressure Ventilation , former smoker,           Cardiovascular      Neuro/Psych TIA Neuromuscular disease    GI/Hepatic GERD  Controlled,  Endo/Other  Morbid obesity  Renal/GU      Musculoskeletal   Abdominal   Peds  Hematology   Anesthesia Other Findings   Reproductive/Obstetrics                             Anesthesia Physical Anesthesia Plan  ASA: III  Anesthesia Plan: General   Post-op Pain Management:    Induction: Intravenous  PONV Risk Score and Plan:   Airway Management Planned: Oral ETT  Additional Equipment:   Intra-op Plan:   Post-operative Plan:   Informed Consent: I have reviewed the patients History and Physical, chart, labs and discussed the procedure including the risks, benefits and alternatives for the proposed anesthesia with the patient or authorized representative who has indicated his/her understanding and acceptance.       Plan Discussed with: CRNA  Anesthesia Plan Comments: (EKG ok. R eye blind.)       Anesthesia Quick Evaluation

## 2018-12-13 NOTE — Transfer of Care (Signed)
Immediate Anesthesia Transfer of Care Note  Patient: Curtis Bray.  Procedure(s) Performed: RADIOACTIVE SEED IMPLANT/BRACHYTHERAPY IMPLANT (N/A )  Patient Location: PACU  Anesthesia Type:General  Level of Consciousness: drowsy  Airway & Oxygen Therapy: Patient Spontanous Breathing and Patient connected to face mask oxygen  Post-op Assessment: Report given to RN and Post -op Vital signs reviewed and stable  Post vital signs: Reviewed and stable  Last Vitals:  Vitals Value Taken Time  BP 142/76 12/13/18 0858  Temp    Pulse 64 12/13/18 0858  Resp 7 12/13/18 0858  SpO2 94 % 12/13/18 0858  Vitals shown include unvalidated device data.  Last Pain:  Vitals:   12/13/18 0639  TempSrc: Oral  PainSc: 0-No pain         Complications: No apparent anesthesia complications

## 2018-12-13 NOTE — Progress Notes (Signed)
Radiation Oncology I-125 interstitial implant note  Name: Curtis Bray.   Date:   12/13/2018 MRN:  [id number] DOB: December 12, 1960    This 58 y.o. male presents to the hospital today for I-125 interstitial implant for Gleason 7 (3+4) adenocarcinoma the prostate presenting with a PSA of 6.7..  REFERRING PROVIDER: Billey Co, MD  HPI: Patient is a 58 year old male presented with a PSA of 6.7 found on transrectal ultrasound-guided biopsy to have a Gleason 7 (3+4) adenocarcinoma.Marland Kitchen  He is taken to the OR today for I-125 interstitial implant.  Both CT scan and bone scan showed no evidence of metastatic disease.  Based on his BMI of 40 not recommended for surgical intervention.  He also has a history of TIAs.  He was on Lupron awaiting his interstitial implant.  COMPLICATIONS OF TREATMENT: none  FOLLOW UP COMPLIANCE: keeps appointments   PHYSICAL EXAM:  There were no vitals taken for this visit. Well-developed well-nourished patient in NAD. HEENT reveals PERLA, EOMI, discs not visualized.  Oral cavity is clear. No oral mucosal lesions are identified. Neck is clear without evidence of cervical or supraclavicular adenopathy. Lungs are clear to A&P. Cardiac examination is essentially unremarkable with regular rate and rhythm without murmur rub or thrill. Abdomen is benign with no organomegaly or masses noted. Motor sensory and DTR levels are equal and symmetric in the upper and lower extremities. Cranial nerves II through XII are grossly intact. Proprioception is intact. No peripheral adenopathy or edema is identified. No motor or sensory levels are noted. Crude visual fields are within normal range.  RADIOLOGY RESULTS: Ultrasound and plain films used for implant  PLAN: Patient was taken to the operating room and general anesthesia was administered. Legs were immobilized in stirrups and patient was positioned in the exact same proportions as original volume study. Patient was prepped and Foley  catheter was placed. Ultrasound guidance identified the prostate and recreated the original set up as per treatment planning volume study. 26 needles were placed under ultrasound guidance with PVCs delivered to the prostate volume a total of 69 seeds were implanted. After completion of procedure cystoscopy was performed by urology and no evidence of seeds in the bladder were noted. Patient tolerated the procedure extremely well. Initial plain film as doublecheck identified 80 seeds in the prostate. Patient has followup appointment in one month for CT scan for quality assurance will be performed.    Noreene Filbert, MD

## 2018-12-13 NOTE — Anesthesia Procedure Notes (Signed)
Procedure Name: Intubation Date/Time: 12/13/2018 7:39 AM Performed by: Eben Burow, CRNA Pre-anesthesia Checklist: Patient identified, Emergency Drugs available, Suction available and Patient being monitored Patient Re-evaluated:Patient Re-evaluated prior to induction Oxygen Delivery Method: Circle system utilized Preoxygenation: Pre-oxygenation with 100% oxygen Induction Type: IV induction Laryngoscope Size: McGraph and 4 Grade View: Grade I Tube type: Oral Tube size: 8.0 mm Number of attempts: 1 Airway Equipment and Method: Stylet,  Video-laryngoscopy and LTA kit utilized Placement Confirmation: positive ETCO2 and breath sounds checked- equal and bilateral Secured at: 23 cm Tube secured with: Tape Dental Injury: Teeth and Oropharynx as per pre-operative assessment

## 2018-12-13 NOTE — Anesthesia Post-op Follow-up Note (Signed)
Anesthesia QCDR form completed.        

## 2018-12-13 NOTE — H&P (Signed)
   12/13/18 7:18 AM   Curtis Bray. Jun 03, 1960 [id number]  HPI: Curtis Bray is a 58 year old male with high risk prostate cancer with no evidence of metastatic disease on CT or bone scan.  PSA was 6.7 at time of diagnosis.  He is already started a 2-year course of ADT.  Secondary to his morbid obesity, he is not a candidate for robotic prostatectomy, and elected to undergo brachytherapy.  He denies any chest pain or shortness of breath.   PMH: Past Medical History:  Diagnosis Date  . Allergy   . Carpal tunnel syndrome   . DDD (degenerative disc disease), cervical   . DDD (degenerative disc disease), cervical   . Edema   . GERD (gastroesophageal reflux disease)   . Hyperlipidemia   . Personal history of colonic polyps   . Sleep apnea    Bi-pap  . Sleep apnea   . TIA (transient ischemic attack) 07/2011  . Vision impairment    Blindness Right Eye    Surgical History: Past Surgical History:  Procedure Laterality Date  . APPENDECTOMY    . carpel tunnel Bilateral   . COLONOSCOPY  2015   Found 2 beign polyps, repeat 10 years  . TONSILLECTOMY     Allergies: No Known Allergies  Family History: Family History  Problem Relation Age of Onset  . Hearing loss Father   . Hypertension Father   . Pneumonia Father   . Asthma Son   . Cancer Maternal Grandmother        Colon    Social History:  reports that he quit smoking about 23 years ago. He quit after 18.00 years of use. He has never used smokeless tobacco. He reports current alcohol use. He reports that he does not use drugs.  ROS: Please see flowsheet from today's date for complete review of systems.  Physical Exam: BP 128/90   Pulse 69   Temp (!) 97.5 F (36.4 C) (Oral)   Resp 16   Ht 5' 10.5" (1.791 m)   Wt 128 kg   SpO2 99%   BMI 39.92 kg/m    Constitutional:  Alert and oriented, No acute distress. Cardiovascular: Regular rate and rhythm Respiratory: Clear to auscultation bilaterally GI: Abdomen is  soft, nontender, nondistended, no abdominal masses Lymph: No cervical or inguinal lymphadenopathy. Skin: No rashes, bruises or suspicious lesions. Neurologic: Grossly intact, no focal deficits, moving all 4 extremities. Psychiatric: Normal mood and affect.  Assessment & Plan:   In summary, the patient is a 58 year old male with morbid obesity and high risk prostate cancer here today for brachytherapy with radiation oncology.  Informed consent obtained, we specifically discussed the risks of bleeding, infection, post-operative pain, need for additional procedures, urinary symptoms including retention, dysuria, urgency, frequency, and incontinence, and need for long-term PSA surveillance.  Curtis Co, MD 12/13/2018  [facility] [address], [place], [place] [address] [phone]

## 2018-12-13 NOTE — Anesthesia Postprocedure Evaluation (Signed)
Anesthesia Post Note  Patient: Curtis Bray.  Procedure(s) Performed: RADIOACTIVE SEED IMPLANT/BRACHYTHERAPY IMPLANT (N/A )  Patient location during evaluation: PACU Anesthesia Type: General Level of consciousness: awake and alert Pain management: pain level controlled Vital Signs Assessment: post-procedure vital signs reviewed and stable Respiratory status: spontaneous breathing, nonlabored ventilation, respiratory function stable and patient connected to nasal cannula oxygen Cardiovascular status: blood pressure returned to baseline and stable Postop Assessment: no apparent nausea or vomiting Anesthetic complications: no     Last Vitals:  Vitals:   12/13/18 0938 12/13/18 1015  BP: 137/75 134/77  Pulse: 63 61  Resp: 14 16  Temp: (!) 35.9 C (!) 35.9 C  SpO2: 94% 96%    Last Pain:  Vitals:   12/13/18 1015  TempSrc: Temporal  PainSc: Curtis Bray

## 2018-12-13 NOTE — Op Note (Signed)
Preoperative diagnosis: Adenocarcinoma of the prostate    Postoperative diagnosis: Same    Procedure: I-125 prostate seed implantation, cystoscopy   Surgeon: Nickolas Madrid, MD   Radiation Oncologist: Noreene Filbert, M.D.    Anesthesia: General   Drains: none   Complications: none   Indications: High risk Prostate cancer   Procedure: The patient was brought to operating suite and placement table in the supine position. At this time, a universal timeout protocol was performed, all team members were identified, Venodyne boots are placed, and he was administered IV Cipro in the preoperative period. He was placed in lithotomy position and prepped and draped in usual fashion. The radiation oncology department placed a transrectal ultrasound probe anchoring stand/ grid and aligned with previous imaging from the volume study. Foley catheter was inserted without difficulty.  All needle passage was done with real-time transrectal ultrasound guidance in both the transverse and sagittal plains in order to achieve the desired preplanned position. 69 active seeds were implanted. The Foley catheter was removed and a rigid cystoscopy failed to show any seeds outside the prostate without evidence of trauma to the urethral, prostatic fossa, or bladder. There was a high bladder neck. The bladder was drained.  A fluoroscopic image was then obtained showing excellent distrubution of the brachytherapy seeds.  Each seed was counted and counts were correct.     The patient was then repositioned in the supine position, reversed from anesthesia, and taken to the PACU in stable condition.  Nickolas Madrid, MD 12/13/2018

## 2018-12-14 ENCOUNTER — Telehealth: Payer: Self-pay | Admitting: Family Medicine

## 2018-12-14 DIAGNOSIS — R202 Paresthesia of skin: Secondary | ICD-10-CM

## 2018-12-14 NOTE — Telephone Encounter (Signed)
Copied from [place] [id number]. Topic: Referral - Request for Referral >> Dec 14, 2018  4:15 PM Rainey Pines A wrote: Has patient seen PCP for this complaint? Yes *If NO, is insurance requiring patient see PCP for this issue before PCP can refer them? Referral for which specialty: Neurological Preferred provider/office: [facility]

## 2018-12-14 NOTE — Telephone Encounter (Signed)
ERROR

## 2018-12-20 ENCOUNTER — Encounter: Payer: Self-pay | Admitting: Family Medicine

## 2018-12-24 ENCOUNTER — Encounter: Payer: Self-pay | Admitting: Urology

## 2019-01-06 ENCOUNTER — Encounter: Payer: Self-pay | Admitting: Radiation Oncology

## 2019-01-07 ENCOUNTER — Other Ambulatory Visit: Payer: Self-pay

## 2019-01-10 ENCOUNTER — Other Ambulatory Visit: Payer: Self-pay | Admitting: *Deleted

## 2019-01-10 ENCOUNTER — Ambulatory Visit
Admission: RE | Admit: 2019-01-10 | Discharge: 2019-01-10 | Disposition: A | Discharge status: Home / Self Care | Payer: BC Managed Care – PPO | Source: Ambulatory Visit | Attending: Radiation Oncology | Admitting: Radiation Oncology

## 2019-01-10 ENCOUNTER — Encounter: Payer: Self-pay | Admitting: Radiation Oncology

## 2019-01-10 ENCOUNTER — Other Ambulatory Visit: Payer: Self-pay

## 2019-01-10 VITALS — BP 144/93 | HR 73 | Temp 97.9°F | Resp 16 | Wt 284.4 lb

## 2019-01-10 DIAGNOSIS — C61 Malignant neoplasm of prostate: Secondary | ICD-10-CM | POA: Insufficient documentation

## 2019-01-10 DIAGNOSIS — R351 Nocturia: Secondary | ICD-10-CM | POA: Diagnosis not present

## 2019-01-10 DIAGNOSIS — Z923 Personal history of irradiation: Secondary | ICD-10-CM | POA: Diagnosis not present

## 2019-01-10 DIAGNOSIS — R350 Frequency of micturition: Secondary | ICD-10-CM | POA: Diagnosis not present

## 2019-01-10 DIAGNOSIS — Z510 Encounter for antineoplastic radiation therapy: Secondary | ICD-10-CM | POA: Insufficient documentation

## 2019-01-10 NOTE — Progress Notes (Signed)
Radiation Oncology Follow up Note  Name: Curtis Bray.   Date:   01/10/2019 MRN:  [id number] DOB: 10-04-1960    This 58 y.o. male presents to the clinic today for 1 month follow-up status post I-125 interstitial implant for Gleason 7 (3+4) adenocarcinoma the prostate presenting the PSA of 6.7.  REFERRING PROVIDER: Valerie Roys, DO  HPI: Patient is a 58 year old male now at 1 month having completed I-125 interstitial implant for a Gleason 7 (3+4) presenting with a PSA of 6.7 seen today in routine follow-up he is doing fairly well still states he is having some urgency and frequency of urination only nocturia x1.  He is having no diarrhea.  He had a CT scan today for quality assurance showing excellent placement of sources.  COMPLICATIONS OF TREATMENT: none  FOLLOW UP COMPLIANCE: keeps appointments   PHYSICAL EXAM:  BP (!) 144/93 (BP Location: Left Arm, Patient Position: Sitting)   Pulse 73   Temp 97.9 F (36.6 C) (Tympanic)   Resp 16   Wt 284 lb 6.4 oz (129 kg)   BMI 40.23 kg/m  Well-developed well-nourished patient in NAD. HEENT reveals PERLA, EOMI, discs not visualized.  Oral cavity is clear. No oral mucosal lesions are identified. Neck is clear without evidence of cervical or supraclavicular adenopathy. Lungs are clear to A&P. Cardiac examination is essentially unremarkable with regular rate and rhythm without murmur rub or thrill. Abdomen is benign with no organomegaly or masses noted. Motor sensory and DTR levels are equal and symmetric in the upper and lower extremities. Cranial nerves II through XII are grossly intact. Proprioception is intact. No peripheral adenopathy or edema is identified. No motor or sensory levels are noted. Crude visual fields are within normal range.  RADIOLOGY RESULTS: CT scan reviewed and will be used for QA on his interstitial implant  PLAN: Present time patient is doing well very low side effect profile from I-125 interstitial implant.  I am  pleased with his overall progress I have assured him his urinary symptoms will improve once he is no longer active.  With radiation sources.  I have asked to see him back in 3 months for follow-up with a PSA prior to that visit.  Patient knows to call with any concerns at any time.  I would like to take this opportunity to thank you for allowing me to participate in the care of your patient.Noreene Filbert, MD

## 2019-01-11 DIAGNOSIS — C61 Malignant neoplasm of prostate: Secondary | ICD-10-CM | POA: Diagnosis present

## 2019-01-11 DIAGNOSIS — Z510 Encounter for antineoplastic radiation therapy: Secondary | ICD-10-CM | POA: Diagnosis not present

## 2019-01-12 ENCOUNTER — Other Ambulatory Visit: Payer: Self-pay | Admitting: Otolaryngology

## 2019-01-12 ENCOUNTER — Other Ambulatory Visit: Payer: Self-pay | Admitting: Family Medicine

## 2019-01-12 ENCOUNTER — Other Ambulatory Visit (HOSPITAL_COMMUNITY): Payer: Self-pay | Admitting: Otolaryngology

## 2019-01-12 DIAGNOSIS — H90A22 Sensorineural hearing loss, unilateral, left ear, with restricted hearing on the contralateral side: Secondary | ICD-10-CM

## 2019-01-18 DIAGNOSIS — C61 Malignant neoplasm of prostate: Secondary | ICD-10-CM | POA: Diagnosis not present

## 2019-01-24 ENCOUNTER — Other Ambulatory Visit: Payer: Self-pay

## 2019-01-24 ENCOUNTER — Ambulatory Visit
Admission: RE | Admit: 2019-01-24 | Discharge: 2019-01-24 | Disposition: A | Discharge status: Home / Self Care | Payer: BC Managed Care – PPO | Source: Ambulatory Visit | Attending: Otolaryngology | Admitting: Otolaryngology

## 2019-01-24 DIAGNOSIS — H90A22 Sensorineural hearing loss, unilateral, left ear, with restricted hearing on the contralateral side: Secondary | ICD-10-CM | POA: Insufficient documentation

## 2019-01-24 MED ORDER — GADOBUTROL 1 MMOL/ML IV SOLN
10.0000 mL | Freq: Once | INTRAVENOUS | Status: AC | PRN
  Administered 2019-01-24: 10 mL via INTRAVENOUS

## 2019-03-18 ENCOUNTER — Telehealth: Payer: Self-pay | Admitting: Podiatry

## 2019-03-18 DIAGNOSIS — M722 Plantar fascial fibromatosis: Secondary | ICD-10-CM | POA: Diagnosis not present

## 2019-03-18 NOTE — Telephone Encounter (Signed)
Pt called asking if he could order another pair of orthotics just like the ones he got last yr. I told him I would pass it on to  [facility] and if any issues we would call him back. He is wanting to pick them up in [place] office.

## 2019-03-18 NOTE — Telephone Encounter (Signed)
Informed patient-per Dr. Doristine Counter recommendation provided phone number to Dr. Olena Leatherwood office. Verbalized understanding.

## 2019-03-21 ENCOUNTER — Telehealth: Payer: Self-pay | Admitting: Family Medicine

## 2019-03-21 ENCOUNTER — Encounter: Payer: Self-pay | Admitting: Family Medicine

## 2019-03-21 ENCOUNTER — Other Ambulatory Visit: Payer: Self-pay | Admitting: Family Medicine

## 2019-03-21 ENCOUNTER — Ambulatory Visit (INDEPENDENT_AMBULATORY_CARE_PROVIDER_SITE_OTHER): Payer: BC Managed Care – PPO | Admitting: Family Medicine

## 2019-03-21 ENCOUNTER — Other Ambulatory Visit: Payer: Self-pay

## 2019-03-21 DIAGNOSIS — Z800 Family history of malignant neoplasm of digestive organs: Secondary | ICD-10-CM

## 2019-03-21 DIAGNOSIS — K625 Hemorrhage of anus and rectum: Secondary | ICD-10-CM | POA: Diagnosis not present

## 2019-03-21 DIAGNOSIS — C61 Malignant neoplasm of prostate: Secondary | ICD-10-CM | POA: Diagnosis not present

## 2019-03-21 MED ORDER — HYDROCORTISONE ACETATE 25 MG RE SUPP: 25.0000 mg | Freq: Two times a day (BID) | RECTAL | 3 refills | Status: DC

## 2019-03-21 MED ORDER — POLYETHYLENE GLYCOL 3350 17 GM/SCOOP PO POWD: 17.0000 g | Freq: Two times a day (BID) | ORAL | 1 refills | Status: DC | PRN

## 2019-03-21 NOTE — Telephone Encounter (Signed)
Pt would like to have GI referral sent to [facility] to Dr.Anna, her office called, pt is scheduled for Wednesday, they are asking to have referral to them by then for pt   Thanks.   Phone: [phone]  Fax: [phone]

## 2019-03-21 NOTE — Telephone Encounter (Signed)
Routing to provider for referral

## 2019-03-21 NOTE — Telephone Encounter (Signed)
Referral to GI already in. OK to direct to Dr. Vicente Males

## 2019-03-21 NOTE — Patient Instructions (Addendum)
How to Take a [employer] A sitz bath is a warm water bath that may be used to care for your rectum, genital area, or the area between your rectum and genitals (perineum). For a sitz bath, the water only comes up to your hips and covers your buttocks. A sitz bath may done at home in a bathtub or with a portable sitz bath that fits over the toilet. Your health care provider may recommend a sitz bath to help:  Relieve pain and discomfort after delivering a baby.  Relieve pain and itching from hemorrhoids or anal fissures.  Relieve pain after certain surgeries.  Relax muscles that are sore or tight. How to take a sitz bath Take 3-4 sitz baths a day, or as many as told by your health care provider. Bathtub sitz bath To take a sitz bath in a bathtub: 1. Partially fill a bathtub with warm water. The water should be deep enough to cover your hips and buttocks when you are sitting in the tub. 2. If your health care provider told you to put medicine in the water, follow his or her instructions. 3. Sit in the water. 4. Open the tub drain a little, and leave it open during your bath. 5. Turn on the warm water again, enough to replace the water that is draining out. Keep the water running throughout your bath. This helps keep the water at the right level and the right temperature. 6. Soak in the water for 15-20 minutes, or as long as told by your health care provider. 7. When you are done, be careful when you stand up. You may feel dizzy. 8. After the sitz bath, pat yourself dry. Do not rub your skin to dry it.  Over-the-toilet sitz bath To take a sitz bath with an over-the-toilet basin: 1. Follow the manufacturer's instructions. 2. Fill the basin with warm water. 3. If your health care provider told you to put medicine in the water, follow his or her instructions. 4. Sit on the seat. Make sure the water covers your buttocks and perineum. 5. Soak in the water for 15-20 minutes, or as long as told by  your health care provider. 6. After the sitz bath, pat yourself dry. Do not rub your skin to dry it. 7. Clean and dry the basin between uses. 8. Discard the basin if it cracks, or according to the manufacturer's instructions. Contact a health care provider if:  Your symptoms get worse. Do not continue with sitz baths if your symptoms get worse.  You have new symptoms. If this happens, do not continue with sitz baths until you talk with your health care provider. Summary  A sitz bath is a warm water bath in which the water only comes up to your hips and covers your buttocks.  A sitz bath may help relieve itching, relieve pain, and relax muscles that are sore or tight in the lower part of your body, including your genital area.  Take 3-4 sitz baths a day, or as many as told by your health care provider. Soak in the water for 15-20 minutes.  Do not continue with sitz baths if your symptoms get worse. This information is not intended to replace advice given to you by your health care provider. Make sure you discuss any questions you have with your health care provider. Document Released: 02/02/2004 Document Revised: 05/14/2017 Document Reviewed: 05/14/2017 Elsevier Patient Education  [place]. Escitalopram tablets What is this medicine? ESCITALOPRAM (es sye TAL  oh pram) is used to treat depression and certain types of anxiety. This medicine may be used for other purposes; ask your health care provider or pharmacist if you have questions. COMMON BRAND NAME(S): Lexapro What should I tell my health care provider before I take this medicine? They need to know if you have any of these conditions:  bipolar disorder or a family history of bipolar disorder  diabetes  glaucoma  heart disease  kidney or liver disease  receiving electroconvulsive therapy  seizures (convulsions)  suicidal thoughts, plans, or attempt by you or a family member  an unusual or allergic reaction to  escitalopram, the related drug citalopram, other medicines, foods, dyes, or preservatives  pregnant or trying to become pregnant  breast-feeding How should I use this medicine? Take this medicine by mouth with a glass of water. Follow the directions on the prescription label. You can take it with or without food. If it upsets your stomach, take it with food. Take your medicine at regular intervals. Do not take it more often than directed. Do not stop taking this medicine suddenly except upon the advice of your doctor. Stopping this medicine too quickly may cause serious side effects or your condition may worsen. A special MedGuide will be given to you by the pharmacist with each prescription and refill. Be sure to read this information carefully each time. Talk to your pediatrician regarding the use of this medicine in children. Special care may be needed. Overdosage: If you think you have taken too much of this medicine contact a poison control center or emergency room at once. NOTE: This medicine is only for you. Do not share this medicine with others. What if I miss a dose? If you miss a dose, take it as soon as you can. If it is almost time for your next dose, take only that dose. Do not take double or extra doses. What may interact with this medicine? Do not take this medicine with any of the following medications:  certain medicines for fungal infections like fluconazole, itraconazole, ketoconazole, posaconazole, voriconazole  cisapride  citalopram  dronedarone  linezolid  MAOIs like Carbex, Eldepryl, Marplan, Nardil, and Parnate  methylene blue (injected into a vein)  pimozide  thioridazine This medicine may also interact with the following medications:  alcohol  amphetamines  aspirin and aspirin-like medicines  carbamazepine  certain medicines for depression, anxiety, or psychotic disturbances  certain medicines for migraine headache like almotriptan, eletriptan,  frovatriptan, naratriptan, rizatriptan, sumatriptan, zolmitriptan  certain medicines for sleep  certain medicines that treat or prevent blood clots like warfarin, enoxaparin, dalteparin  cimetidine  diuretics  dofetilide  fentanyl  furazolidone  isoniazid  lithium  metoprolol  NSAIDs, medicines for pain and inflammation, like ibuprofen or naproxen  other medicines that prolong the QT interval (cause an abnormal heart rhythm)  procarbazine  rasagiline  supplements like St. John's wort, kava kava, valerian  tramadol  tryptophan  ziprasidone This list may not describe all possible interactions. Give your health care provider a list of all the medicines, herbs, non-prescription drugs, or dietary supplements you use. Also tell them if you smoke, drink alcohol, or use illegal drugs. Some items may interact with your medicine. What should I watch for while using this medicine? Tell your doctor if your symptoms do not get better or if they get worse. Visit your doctor or health care professional for regular checks on your progress. Because it may take several weeks to see the full effects of  this medicine, it is important to continue your treatment as prescribed by your doctor. Patients and their families should watch out for new or worsening thoughts of suicide or depression. Also watch out for sudden changes in feelings such as feeling anxious, agitated, panicky, irritable, hostile, aggressive, impulsive, severely restless, overly excited and hyperactive, or not being able to sleep. If this happens, especially at the beginning of treatment or after a change in dose, call your health care professional. Dennis Bast may get drowsy or dizzy. Do not drive, use machinery, or do anything that needs mental alertness until you know how this medicine affects you. Do not stand or sit up quickly, especially if you are an older patient. This reduces the risk of dizzy or fainting spells. Alcohol may  interfere with the effect of this medicine. Avoid alcoholic drinks. Your mouth may get dry. Chewing sugarless gum or sucking hard candy, and drinking plenty of water may help. Contact your doctor if the problem does not go away or is severe. What side effects may I notice from receiving this medicine? Side effects that you should report to your doctor or health care professional as soon as possible:  allergic reactions like skin rash, itching or hives, swelling of the face, lips, or tongue  anxious  black, tarry stools  changes in vision  confusion  elevated mood, decreased need for sleep, racing thoughts, impulsive behavior  eye pain  fast, irregular heartbeat  feeling faint or lightheaded, falls  feeling agitated, angry, or irritable  hallucination, loss of contact with reality  loss of balance or coordination  loss of memory  painful or prolonged erections  restlessness, pacing, inability to keep still  seizures  stiff muscles  suicidal thoughts or other mood changes  trouble sleeping  unusual bleeding or bruising  unusually weak or tired  vomiting Side effects that usually do not require medical attention (report to your doctor or health care professional if they continue or are bothersome):  changes in appetite  change in sex drive or performance  headache  increased sweating  indigestion, nausea  tremors This list may not describe all possible side effects. Call your doctor for medical advice about side effects. You may report side effects to FDA at 1-800-FDA-1088. Where should I keep my medicine? Keep out of reach of children. Store at room temperature between 15 and 30 degrees C (59 and 86 degrees F). Throw away any unused medicine after the expiration date. NOTE: This sheet is a summary. It may not cover all possible information. If you have questions about this medicine, talk to your doctor, pharmacist, or health care provider.  2020  Elsevier/Gold Standard (2018-05-03 11:21:44) Sertraline tablets What is this medicine? SERTRALINE (SER tra leen) is used to treat depression. It may also be used to treat obsessive compulsive disorder, panic disorder, post-trauma stress, premenstrual dysphoric disorder (PMDD) or social anxiety. This medicine may be used for other purposes; ask your health care provider or pharmacist if you have questions. COMMON BRAND NAME(S): Zoloft What should I tell my health care provider before I take this medicine? They need to know if you have any of these conditions:  bleeding disorders  bipolar disorder or a family history of bipolar disorder  glaucoma  heart disease  high blood pressure  history of irregular heartbeat  history of low levels of calcium, magnesium, or potassium in the blood  if you often drink alcohol  liver disease  receiving electroconvulsive therapy  seizures  suicidal thoughts, plans,  or attempt; a previous suicide attempt by you or a family member  take medicines that treat or prevent blood clots  thyroid disease  an unusual or allergic reaction to sertraline, other medicines, foods, dyes, or preservatives  pregnant or trying to get pregnant  breast-feeding How should I use this medicine? Take this medicine by mouth with a glass of water. Follow the directions on the prescription label. You can take it with or without food. Take your medicine at regular intervals. Do not take your medicine more often than directed. Do not stop taking this medicine suddenly except upon the advice of your doctor. Stopping this medicine too quickly may cause serious side effects or your condition may worsen. A special MedGuide will be given to you by the pharmacist with each prescription and refill. Be sure to read this information carefully each time. Talk to your pediatrician regarding the use of this medicine in children. While this drug may be prescribed for children as  young as 7 years for selected conditions, precautions do apply. Overdosage: If you think you have taken too much of this medicine contact a poison control center or emergency room at once. NOTE: This medicine is only for you. Do not share this medicine with others. What if I miss a dose? If you miss a dose, take it as soon as you can. If it is almost time for your next dose, take only that dose. Do not take double or extra doses. What may interact with this medicine? Do not take this medicine with any of the following medications:  cisapride  dronedarone  linezolid  MAOIs like Carbex, Eldepryl, Marplan, Nardil, and Parnate  methylene blue (injected into a vein)  pimozide  thioridazine This medicine may also interact with the following medications:  alcohol  amphetamines  aspirin and aspirin-like medicines  certain medicines for depression, anxiety, or psychotic disturbances  certain medicines for fungal infections like ketoconazole, fluconazole, posaconazole, and itraconazole  certain medicines for irregular heart beat like flecainide, quinidine, propafenone  certain medicines for migraine headaches like almotriptan, eletriptan, frovatriptan, naratriptan, rizatriptan, sumatriptan, zolmitriptan  certain medicines for sleep  certain medicines for seizures like carbamazepine, valproic acid, phenytoin  certain medicines that treat or prevent blood clots like warfarin, enoxaparin, dalteparin  cimetidine  digoxin  diuretics  fentanyl  isoniazid  lithium  NSAIDs, medicines for pain and inflammation, like ibuprofen or naproxen  other medicines that prolong the QT interval (cause an abnormal heart rhythm) like dofetilide  rasagiline  safinamide  supplements like St. John's wort, kava kava, valerian  tolbutamide  tramadol  tryptophan This list may not describe all possible interactions. Give your health care provider a list of all the medicines, herbs,  non-prescription drugs, or dietary supplements you use. Also tell them if you smoke, drink alcohol, or use illegal drugs. Some items may interact with your medicine. What should I watch for while using this medicine? Tell your doctor if your symptoms do not get better or if they get worse. Visit your doctor or health care professional for regular checks on your progress. Because it may take several weeks to see the full effects of this medicine, it is important to continue your treatment as prescribed by your doctor. Patients and their families should watch out for new or worsening thoughts of suicide or depression. Also watch out for sudden changes in feelings such as feeling anxious, agitated, panicky, irritable, hostile, aggressive, impulsive, severely restless, overly excited and hyperactive, or not being able to  sleep. If this happens, especially at the beginning of treatment or after a change in dose, call your health care professional. Dennis Bast may get drowsy or dizzy. Do not drive, use machinery, or do anything that needs mental alertness until you know how this medicine affects you. Do not stand or sit up quickly, especially if you are an older patient. This reduces the risk of dizzy or fainting spells. Alcohol may interfere with the effect of this medicine. Avoid alcoholic drinks. Your mouth may get dry. Chewing sugarless gum or sucking hard candy, and drinking plenty of water may help. Contact your doctor if the problem does not go away or is severe. What side effects may I notice from receiving this medicine? Side effects that you should report to your doctor or health care professional as soon as possible:  allergic reactions like skin rash, itching or hives, swelling of the face, lips, or tongue  anxious  black, tarry stools  changes in vision  confusion  elevated mood, decreased need for sleep, racing thoughts, impulsive behavior  eye pain  fast, irregular heartbeat  feeling faint  or lightheaded, falls  feeling agitated, angry, or irritable  hallucination, loss of contact with reality  loss of balance or coordination  loss of memory  painful or prolonged erections  restlessness, pacing, inability to keep still  seizures  stiff muscles  suicidal thoughts or other mood changes  trouble sleeping  unusual bleeding or bruising  unusually weak or tired  vomiting Side effects that usually do not require medical attention (report to your doctor or health care professional if they continue or are bothersome):  change in appetite or weight  change in sex drive or performance  diarrhea  increased sweating  indigestion, nausea  tremors This list may not describe all possible side effects. Call your doctor for medical advice about side effects. You may report side effects to FDA at 1-800-FDA-1088. Where should I keep my medicine? Keep out of the reach of children. Store at room temperature between 15 and 30 degrees C (59 and 86 degrees F). Throw away any unused medicine after the expiration date. NOTE: This sheet is a summary. It may not cover all possible information. If you have questions about this medicine, talk to your doctor, pharmacist, or health care provider.  2020 Elsevier/Gold Standard (2018-05-04 10:09:27)

## 2019-03-21 NOTE — Progress Notes (Signed)
There were no vitals taken for this visit.   Subjective:    Patient ID: Curtis Ran., male    DOB: May 02, 1961, 58 y.o.   MRN: [id number]  HPI: Curtis Bray. is a 58 y.o. male  Chief Complaint  Patient presents with  . Rectal Bleeding   RECTAL BLEEDING Duration: about a week Bright red rectal bleeding: yes  Amount of blood: moderate  Frequency: with every bowel movement up to today- not blood today, has been leaking into his shorts as well  Melena: yes- about a week ago  Spotting on toilet tissue: yes  Anal fullness: no  Perianal pain: yes  Severity: moderate Perianal irritation/itching: no  Constipation: yes  Chronic straining/valsava:  yes  Anal trauma/intercourse: no  Hemorrhoids: no  Previous colonoscopy: yes   Relevant past medical, surgical, family and social history reviewed and updated as indicated. Interim medical history since our last visit reviewed. Allergies and medications reviewed and updated.  Review of Systems  Constitutional: Negative.   Respiratory: Negative.   Cardiovascular: Negative.   Gastrointestinal: Positive for anal bleeding, blood in stool, constipation and rectal pain. Negative for abdominal distention, abdominal pain, diarrhea, nausea and vomiting.  Musculoskeletal: Negative.   Skin: Negative.   Psychiatric/Behavioral: Positive for dysphoric mood. Negative for agitation, behavioral problems, confusion, decreased concentration, hallucinations, self-injury, sleep disturbance and suicidal ideas. The patient is not nervous/anxious and is not hyperactive.     Per HPI unless specifically indicated above     Objective:    There were no vitals taken for this visit.  Wt Readings from Last 3 Encounters:  01/10/19 284 lb 6.4 oz (129 kg)  12/13/18 282 lb 3 oz (128 kg)  12/09/18 281 lb 11.2 oz (127.8 kg)    Physical Exam Vitals signs and nursing note reviewed.  Pulmonary:     Effort: Pulmonary effort is normal. No respiratory  distress.     Comments: Speaking in full sentences Neurological:     Mental Status: He is alert.  Psychiatric:        Mood and Affect: Mood normal.        Behavior: Behavior normal.        Thought Content: Thought content normal.        Judgment: Judgment normal.     Results for orders placed or performed during the hospital encounter of 12/09/18  SARS Coronavirus 2 (Performed in [place] hospital lab)   Specimen: Nasal Swab  Result Value Ref Range   SARS Coronavirus 2 NEGATIVE NEGATIVE  CBC  Result Value Ref Range   WBC 5.5 4.0 - 10.5 K/uL   RBC 5.01 4.22 - 5.81 MIL/uL   Hemoglobin 14.7 13.0 - 17.0 g/dL   HCT 44.0 39.0 - 52.0 %   MCV 87.8 80.0 - 100.0 fL   MCH 29.3 26.0 - 34.0 pg   MCHC 33.4 30.0 - 36.0 g/dL   RDW 12.4 11.5 - 15.5 %   Platelets 177 150 - 400 K/uL   nRBC 0.0 0.0 - 0.2 %      Assessment & Plan:   Problem List Items Addressed This Visit      Genitourinary   Malignant neoplasm of prostate ([place])    Has been depressed with the hormone shots. Will consider depression treatment- information given today. Continue to monitor. Continue to follow with oncology and urology. Call with any concerns.       Relevant Orders   Ambulatory referral to Gastroenterology  Other Visit Diagnoses    Rectal bleeding    -  Primary   Concern for fissure- will treat with miralax and suppositories and sitz bath. Due for colonoscopy in November- refer back to GI. Call with any concerns.   Relevant Orders   Ambulatory referral to Gastroenterology   Family history of colon cancer       Newly diagnosed in a cousin. This has concerned him.   Relevant Orders   Ambulatory referral to Gastroenterology       Follow up plan: Return As scheduled.    . This visit was completed via telephone due to the restrictions of the COVID-19 pandemic. All issues as above were discussed and addressed but no physical exam was performed. If it was felt that the patient should be evaluated in  the office, they were directed there. The patient verbally consented to this visit. Patient was unable to complete an audio/visual visit due to Lack of equipment. Due to the catastrophic nature of the COVID-19 pandemic, this visit was done through audio contact only. . Location of the patient: home . Location of the provider: work . Those involved with this call:  . Provider: Park Liter, DO . CMA: Tiffany Reel, CMA . Front Desk/Registration: Don Perking  . Time spent on call: 25 minutes with patient face to face via video conference. More than 50% of this time was spent in counseling and coordination of care. 40 minutes total spent in review of patient's record and preparation of their chart.

## 2019-03-21 NOTE — Telephone Encounter (Signed)
Copied from [facility] Antonia [phone]. Topic: Referral - Request for Referral >> Mar 21, 2019  3:22 PM Reyne Dumas L wrote: Has patient seen PCP for this complaint? yes *If NO, is insurance requiring patient see PCP for this issue before PCP can refer them? Referral for which specialty: GI Preferred provider/office: [facility] Reason for referral: bleeding.  States that [facility] can't get him in fast enough and he wants to go to [facility].  Pt can be reached at [phone]

## 2019-03-21 NOTE — Assessment & Plan Note (Signed)
Has been depressed with the hormone shots. Will consider depression treatment- information given today. Continue to monitor. Continue to follow with oncology and urology. Call with any concerns.

## 2019-03-21 NOTE — Telephone Encounter (Signed)
Already has appointment scheduled for Wednesday with Dr. Vicente Males- please redirect referral to Dr. Vicente Males

## 2019-03-21 NOTE — Telephone Encounter (Signed)
Routing to referral pool

## 2019-03-23 ENCOUNTER — Telehealth: Payer: Self-pay | Admitting: Gastroenterology

## 2019-03-23 ENCOUNTER — Ambulatory Visit (INDEPENDENT_AMBULATORY_CARE_PROVIDER_SITE_OTHER): Payer: BC Managed Care – PPO | Admitting: Gastroenterology

## 2019-03-23 ENCOUNTER — Other Ambulatory Visit: Payer: Self-pay

## 2019-03-23 DIAGNOSIS — K625 Hemorrhage of anus and rectum: Secondary | ICD-10-CM

## 2019-03-23 DIAGNOSIS — Z791 Long term (current) use of non-steroidal anti-inflammatories (NSAID): Secondary | ICD-10-CM | POA: Diagnosis not present

## 2019-03-23 MED ORDER — NA SULFATE-K SULFATE-MG SULF 17.5-3.13-1.6 GM/177ML PO SOLN: 1.0000 | Freq: Once | ORAL | 0 refills | Status: AC

## 2019-03-23 NOTE — Progress Notes (Signed)
Curtis Bray  [address]  [place]  [place], [place] [address]  Main: [phone]  Fax: [phone]   Gastroenterology Consultation  Referring Provider:     Valerie Roys, DO Primary Care Physician:  Valerie Roys, DO Reason for Consultation:    Rectal bleeding        HPI:   Virtual Visit via video  Note  I connected with patient on 03/23/19 at 10:30 AM EDT by video  and verified that I am speaking with the correct person using two identifiers.   I discussed the limitations, risks, security and privacy concerns of performing an evaluation and management service by video and the availability of in person appointments. I also discussed with the patient that there may be a patient responsible charge related to this service. The patient expressed understanding and agreed to proceed.  Location of the patient: Home Location of provider: Home Participating persons: Patient and provider only   History of Present Illness: Rectal bleeding  Curtis Bray. is a 58 y.o. y/o male referred for consultation & management  by Dr. Wynetta Emery, Megan P, DO.    History of prostate cancer and takes hormone shots.  Last colonoscopy was in 2015 at [facility] see the actual report on epic.   Rectal bleeding :  Onset and where was blood seen  :began last week or 10 days back , lot of dark stools -unsure if black . Last week or so "enough blood to discolor the toilet water pink or red", some blood on the toilet paper -associated with bowel movements  Frequency of bowel movements :2-3 times a day -not hard - previously was hard  Consistency : hard  Change in shape of stool:yes not "normal" like a tape  Pain associated with bowel movements:no  Blood thinner usage:asprin- meloxicam - one tablet daily for back pain - last 1 year  NSAID's: yes  Prior colonoscopy :2015 at [facility] -2 polyps taken out benign  Family history of colon cancer or polyps:first cousin had colon cancer at age 38  Weight loss:no -rather gained    Past Medical History:  Diagnosis Date  . Allergy   . Cancer [facility])    Prostate  . Carpal tunnel syndrome   . DDD (degenerative disc disease), cervical   . DDD (degenerative disc disease), cervical   . Edema   . GERD (gastroesophageal reflux disease)   . Hyperlipidemia   . Personal history of colonic polyps   . Pinched nerve    upper and lower back  . Sleep apnea    Bi-pap  . Sleep apnea   . TIA (transient ischemic attack) 07/2011  . Vision impairment    Blindness Right Eye    Past Surgical History:  Procedure Laterality Date  . APPENDECTOMY    . carpel tunnel Bilateral   . COLONOSCOPY  2015   Found 2 beign polyps, repeat 10 years  . RADIOACTIVE SEED IMPLANT N/A 12/13/2018   Procedure: RADIOACTIVE SEED IMPLANT/BRACHYTHERAPY IMPLANT;  Surgeon: Billey Co, MD;  Location: [facility] ORS;  Service: Urology;  Laterality: N/A;  . TONSILLECTOMY      Prior to Admission medications   Medication Sig Start Date End Date Taking? Authorizing Provider  ASPIR-LOW 81 MG EC tablet  11/27/17   [provider]  CALCIUM PO Take 1 tablet by mouth 2 (two) times a day.    [provider]  Cholecalciferol (VITAMIN D-3) 125 MCG (5000 UT) TABS Take 5,000 Units  by mouth at bedtime.    [provider]  Coenzyme Q10 (COQ10) 200 MG CAPS Take 200 mg by mouth at bedtime.    [provider]  gabapentin (NEURONTIN) 300 MG capsule TAKE 1 CAPSULE BY MOUTH AT BEDTIME 01/12/19   Johnson, Megan P, DO  hydrocortisone (ANUSOL-HC) 25 MG suppository Place 1 suppository (25 mg total) rectally 2 (two) times daily. 03/21/19   Johnson, Megan P, DO  ibuprofen (ADVIL) 200 MG tablet Take 800 mg by mouth every 8 (eight) hours as needed (pain.).    [provider]  meloxicam (MOBIC) 15 MG tablet TAKE 1 TABLET BY MOUTH ONCE DAILY 03/21/19   Johnson, Megan P, DO  polyethylene glycol powder (GLYCOLAX/MIRALAX) 17 GM/SCOOP powder Take 17 g by mouth 2  (two) times daily as needed. 03/21/19   Johnson, Megan P, DO  pseudoephedrine (SUDAFED) 30 MG tablet Take 60 mg by mouth at bedtime.    [provider]  RABEprazole (ACIPHEX) 20 MG tablet Take 2 tablets (40 mg total) by mouth daily. Patient taking differently: Take 20 mg by mouth daily as needed (hearburn/indigestion).  04/26/18   Johnson, Megan P, DO  simvastatin (ZOCOR) 40 MG tablet TAKE 1 TABLET BY MOUTH DAILY AT 6PM Patient taking differently: Take 40 mg by mouth at bedtime.  04/26/18   Johnson, Megan P, DO  tiZANidine (ZANAFLEX) 4 MG tablet Take 1 tablet (4 mg total) by mouth at bedtime. Patient not taking: Reported on 11/10/2018 06/25/18   Park Liter P, DO  traZODone (DESYREL) 50 MG tablet Take 2 tablets (100 mg total) by mouth at bedtime. 04/26/18   Johnson, Megan P, DO  vitamin B-12 (CYANOCOBALAMIN) 1000 MCG tablet Take 1,000 mcg by mouth at bedtime.    [provider]    Family History  Problem Relation Age of Onset  . Hearing loss Father   . Hypertension Father   . Pneumonia Father   . Asthma Son   . Cancer Maternal Grandmother        Colon     Social History   Tobacco Use  . Smoking status: Former Smoker    Years: 18.00    Quit date: 05/27/1995    Years since quitting: 23.8  . Smokeless tobacco: Never Used  Substance Use Topics  . Alcohol use: Yes    Alcohol/week: 0.0 standard drinks    Comment: Rare  . Drug use: No    Allergies as of 03/23/2019  . (No Known Allergies)    Review of Systems:    All systems reviewed and negative except where noted in HPI. General Appearance:    Alert, cooperative, no distress, appears stated age  Head:    Normocephalic, without obvious abnormality, atraumatic  Eyes:    PERRL, conjunctiva/corneas clear,  Ears:    Grossly normal hearing    Neurologic:   Grossly appears normal     Observations/Objective:  Labs: CBC    Component Value Date/Time   WBC 5.5 12/09/2018 0955   RBC 5.01 12/09/2018 0955   HGB  14.7 12/09/2018 0955   HGB 16.4 04/26/2018 1352   HCT 44.0 12/09/2018 0955   HCT 49.9 04/26/2018 1352   PLT 177 12/09/2018 0955   PLT 199 04/26/2018 1352   MCV 87.8 12/09/2018 0955   MCV 89 04/26/2018 1352   MCH 29.3 12/09/2018 0955   MCHC 33.4 12/09/2018 0955   RDW 12.4 12/09/2018 0955   RDW 12.7 04/26/2018 1352   LYMPHSABS 2.9 04/26/2018 1352   EOSABS  0.2 04/26/2018 1352   BASOSABS 0.1 04/26/2018 1352   CMP     Component Value Date/Time   NA 140 04/26/2018 1352   K 4.4 04/26/2018 1352   CL 102 04/26/2018 1352   CO2 22 04/26/2018 1352   GLUCOSE 84 04/26/2018 1352   BUN 16 04/26/2018 1352   CREATININE 1.22 04/26/2018 1352   CALCIUM 9.2 04/26/2018 1352   PROT 6.5 04/26/2018 1352   ALBUMIN 4.5 04/26/2018 1352   AST 40 04/26/2018 1352   AST 26 09/13/2015 1549   ALT 90 (H) 04/26/2018 1352   ALT 38 09/13/2015 1549   ALKPHOS 80 04/26/2018 1352   BILITOT 0.8 04/26/2018 1352   GFRNONAA 65 04/26/2018 1352   GFRAA 76 04/26/2018 1352    Imaging Studies: No results found.  Assessment and Plan:   Treon Kiracofe. is a 58 y.o. y/o male has been referred for rectal bleeding. H/o prostate cancer and chronic use of Meloxicam    Plan :   1. Colonoscopy+ EGD-Nov 4th 1.45 pm  2. CBC 3. Stop NSAID 4. Restart Rabeprazole 40 mg daily      I have discussed alternative options, risks & benefits,  which include, but are not limited to, bleeding, infection, perforation,respiratory complication & drug reaction.  The patient agrees with this plan & written consent will be obtained.       I discussed the assessment and treatment plan with the patient. The patient was provided an opportunity to ask questions and all were answered. The patient agreed with the plan and demonstrated an understanding of the instructions.   The patient was advised to call back or seek an in-person evaluation if the symptoms worsen or if the condition fails to improve as anticipated.    Dr Curtis Bellows MD,MRCP [facility]) Gastroenterology/Hepatology Pager: [phone]   Speech recognition software was used to dictate the above note.

## 2019-03-23 NOTE — Telephone Encounter (Signed)
Left vm to offer 6 week f/u apt with Dr. Vicente Males

## 2019-03-25 ENCOUNTER — Encounter: Payer: Self-pay | Admitting: Gastroenterology

## 2019-03-25 ENCOUNTER — Encounter
Admission: RE | Admit: 2019-03-25 | Discharge: 2019-03-25 | Disposition: A | Discharge status: Home / Self Care | Payer: BC Managed Care – PPO | Source: Ambulatory Visit | Attending: Gastroenterology | Admitting: Gastroenterology

## 2019-03-25 ENCOUNTER — Other Ambulatory Visit: Payer: Self-pay

## 2019-03-25 DIAGNOSIS — Z01812 Encounter for preprocedural laboratory examination: Secondary | ICD-10-CM | POA: Diagnosis not present

## 2019-03-25 DIAGNOSIS — Z20828 Contact with and (suspected) exposure to other viral communicable diseases: Secondary | ICD-10-CM | POA: Diagnosis not present

## 2019-03-25 LAB — CBC WITH DIFFERENTIAL/PLATELET
Basophils Absolute: 0 10*3/uL (ref 0.0–0.2)
Basos: 1 %
EOS (ABSOLUTE): 0.3 10*3/uL (ref 0.0–0.4)
Eos: 5 %
Hematocrit: 42.6 % (ref 37.5–51.0)
Hemoglobin: 14.7 g/dL (ref 13.0–17.7)
Immature Grans (Abs): 0 10*3/uL (ref 0.0–0.1)
Immature Granulocytes: 0 %
Lymphocytes Absolute: 2.4 10*3/uL (ref 0.7–3.1)
Lymphs: 34 %
MCH: 30.4 pg (ref 26.6–33.0)
MCHC: 34.5 g/dL (ref 31.5–35.7)
MCV: 88 fL (ref 79–97)
Monocytes Absolute: 0.6 10*3/uL (ref 0.1–0.9)
Monocytes: 9 %
Neutrophils Absolute: 3.6 10*3/uL (ref 1.4–7.0)
Neutrophils: 51 %
Platelets: 197 10*3/uL (ref 150–450)
RBC: 4.83 x10E6/uL (ref 4.14–5.80)
RDW: 12.9 % (ref 11.6–15.4)
WBC: 7 10*3/uL (ref 3.4–10.8)

## 2019-03-25 LAB — SARS CORONAVIRUS 2 (TAT 6-24 HRS): SARS Coronavirus 2: NEGATIVE

## 2019-03-30 ENCOUNTER — Ambulatory Visit: Payer: BC Managed Care – PPO | Admitting: Certified Registered Nurse Anesthetist

## 2019-03-30 ENCOUNTER — Encounter: Payer: Self-pay | Admitting: *Deleted

## 2019-03-30 ENCOUNTER — Ambulatory Visit
Admission: RE | Admit: 2019-03-30 | Discharge: 2019-03-30 | Disposition: A | Discharge status: Home / Self Care | Payer: BC Managed Care – PPO | Attending: Gastroenterology | Admitting: Gastroenterology

## 2019-03-30 ENCOUNTER — Other Ambulatory Visit: Payer: Self-pay

## 2019-03-30 ENCOUNTER — Encounter
Admission: RE | Disposition: A | Discharge status: Home / Self Care | Payer: Self-pay | Source: Home / Self Care | Attending: Gastroenterology

## 2019-03-30 DIAGNOSIS — K219 Gastro-esophageal reflux disease without esophagitis: Secondary | ICD-10-CM | POA: Insufficient documentation

## 2019-03-30 DIAGNOSIS — G4730 Sleep apnea, unspecified: Secondary | ICD-10-CM | POA: Diagnosis not present

## 2019-03-30 DIAGNOSIS — E785 Hyperlipidemia, unspecified: Secondary | ICD-10-CM | POA: Insufficient documentation

## 2019-03-30 DIAGNOSIS — Z86010 Personal history of colonic polyps: Secondary | ICD-10-CM | POA: Diagnosis not present

## 2019-03-30 DIAGNOSIS — R195 Other fecal abnormalities: Secondary | ICD-10-CM | POA: Insufficient documentation

## 2019-03-30 DIAGNOSIS — M1990 Unspecified osteoarthritis, unspecified site: Secondary | ICD-10-CM | POA: Insufficient documentation

## 2019-03-30 DIAGNOSIS — Z79899 Other long term (current) drug therapy: Secondary | ICD-10-CM | POA: Insufficient documentation

## 2019-03-30 DIAGNOSIS — Z87891 Personal history of nicotine dependence: Secondary | ICD-10-CM | POA: Insufficient documentation

## 2019-03-30 DIAGNOSIS — H5440 Blindness, one eye, unspecified eye: Secondary | ICD-10-CM | POA: Diagnosis not present

## 2019-03-30 DIAGNOSIS — K625 Hemorrhage of anus and rectum: Secondary | ICD-10-CM | POA: Diagnosis not present

## 2019-03-30 DIAGNOSIS — K228 Other specified diseases of esophagus: Secondary | ICD-10-CM | POA: Diagnosis not present

## 2019-03-30 DIAGNOSIS — K640 First degree hemorrhoids: Secondary | ICD-10-CM | POA: Diagnosis not present

## 2019-03-30 DIAGNOSIS — K5730 Diverticulosis of large intestine without perforation or abscess without bleeding: Secondary | ICD-10-CM | POA: Insufficient documentation

## 2019-03-30 DIAGNOSIS — Z8673 Personal history of transient ischemic attack (TIA), and cerebral infarction without residual deficits: Secondary | ICD-10-CM | POA: Insufficient documentation

## 2019-03-30 DIAGNOSIS — Z6841 Body Mass Index (BMI) 40.0 and over, adult: Secondary | ICD-10-CM | POA: Insufficient documentation

## 2019-03-30 HISTORY — PX: ESOPHAGOGASTRODUODENOSCOPY (EGD) WITH PROPOFOL: SHX5813

## 2019-03-30 HISTORY — PX: COLONOSCOPY WITH PROPOFOL: SHX5780

## 2019-03-30 HISTORY — DX: Unspecified hearing loss, left ear: H91.92

## 2019-03-30 SURGERY — COLONOSCOPY WITH PROPOFOL
Anesthesia: General

## 2019-03-30 MED ORDER — SODIUM CHLORIDE 0.9 % IV SOLN
INTRAVENOUS | Status: DC
  Administered 2019-03-30: 13:00:00 via INTRAVENOUS

## 2019-03-30 MED ORDER — PROPOFOL 10 MG/ML IV BOLUS
INTRAVENOUS | Status: DC | PRN
  Administered 2019-03-30: 70 mg via INTRAVENOUS

## 2019-03-30 MED ORDER — PROPOFOL 500 MG/50ML IV EMUL
INTRAVENOUS | Status: DC | PRN
  Administered 2019-03-30: 175 ug/kg/min via INTRAVENOUS

## 2019-03-30 MED ORDER — LIDOCAINE HCL (CARDIAC) PF 100 MG/5ML IV SOSY
PREFILLED_SYRINGE | INTRAVENOUS | Status: DC | PRN
  Administered 2019-03-30: 50 mg via INTRAVENOUS

## 2019-03-30 NOTE — Anesthesia Preprocedure Evaluation (Signed)
Anesthesia Evaluation  Patient identified by MRN, date of birth, ID band Patient awake    Reviewed: Allergy & Precautions, NPO status , Patient's Chart, lab work & pertinent test results, reviewed documented beta blocker date and time   Airway Mallampati: III  TM Distance: >3 FB     Dental  (+) Chipped   Pulmonary sleep apnea and Continuous Positive Airway Pressure Ventilation , former smoker,           Cardiovascular negative cardio ROS       Neuro/Psych TIA Neuromuscular disease negative psych ROS   GI/Hepatic Neg liver ROS, GERD  Controlled,  Endo/Other  Morbid obesity  Renal/GU negative Renal ROS  negative genitourinary   Musculoskeletal  (+) Arthritis , Osteoarthritis,    Abdominal   Peds negative pediatric ROS (+)  Hematology negative hematology ROS (+)   Anesthesia Other Findings   Reproductive/Obstetrics                             Anesthesia Physical  Anesthesia Plan  ASA: III  Anesthesia Plan: General   Post-op Pain Management:    Induction: Intravenous  PONV Risk Score and Plan:   Airway Management Planned: Nasal Cannula  Additional Equipment:   Intra-op Plan:   Post-operative Plan:   Informed Consent: I have reviewed the patients History and Physical, chart, labs and discussed the procedure including the risks, benefits and alternatives for the proposed anesthesia with the patient or authorized representative who has indicated his/her understanding and acceptance.       Plan Discussed with: CRNA  Anesthesia Plan Comments: (EKG ok. R eye blind.)        Anesthesia Quick Evaluation

## 2019-03-30 NOTE — H&P (Signed)
[name], MD [address], [place], [place], [place], [address] [address], [place], [place], [place], [address] Phone: [phone]  Fax: [phone]  Primary Care Physician:  Valerie Roys, DO   Pre-Procedure History & Physical: HPI:  Curtis Glomb. is a 58 y.o. male is here for an endoscopy and colonoscopy    Past Medical History:  Diagnosis Date  . Allergy   . Cancer [facility])    Prostate  . Carpal tunnel syndrome   . DDD (degenerative disc disease), cervical   . DDD (degenerative disc disease), cervical   . Deafness in left ear   . Edema   . GERD (gastroesophageal reflux disease)   . Hyperlipidemia   . Personal history of colonic polyps   . Pinched nerve    upper and lower back  . Sleep apnea    Bi-pap  . Sleep apnea   . TIA (transient ischemic attack) 07/2011  . Vision impairment    Blindness Right Eye    Past Surgical History:  Procedure Laterality Date  . APPENDECTOMY    . carpel tunnel Bilateral   . COLONOSCOPY  2015   Found 2 beign polyps, repeat 10 years  . RADIOACTIVE SEED IMPLANT N/A 12/13/2018   Procedure: RADIOACTIVE SEED IMPLANT/BRACHYTHERAPY IMPLANT;  Surgeon: Billey Co, MD;  Location: [facility] ORS;  Service: Urology;  Laterality: N/A;  . TONSILLECTOMY      Prior to Admission medications   Medication Sig Start Date End Date Taking? Authorizing Provider  ASPIR-LOW 81 MG EC tablet  11/27/17  Yes [provider]  Coenzyme Q10 (COQ10) 200 MG CAPS Take 200 mg by mouth at bedtime.   Yes [provider]  gabapentin (NEURONTIN) 300 MG capsule TAKE 1 CAPSULE BY MOUTH AT BEDTIME 01/12/19  Yes Johnson, Megan P, DO  pseudoephedrine (SUDAFED) 30 MG tablet Take 60 mg by mouth at bedtime.   Yes [provider]  RABEprazole (ACIPHEX) 20 MG tablet Take 2 tablets (40 mg total) by mouth daily. Patient taking differently: Take 20 mg by mouth daily as needed (hearburn/indigestion).  04/26/18  Yes Johnson, Megan  P, DO  simvastatin (ZOCOR) 40 MG tablet TAKE 1 TABLET BY MOUTH DAILY AT 6PM Patient taking differently: Take 40 mg by mouth at bedtime.  04/26/18  Yes Johnson, Megan P, DO  traZODone (DESYREL) 50 MG tablet Take 2 tablets (100 mg total) by mouth at bedtime. 04/26/18  Yes Johnson, Megan P, DO  CALCIUM PO Take 1 tablet by mouth 2 (two) times a day.    [provider]  Cholecalciferol (VITAMIN D-3) 125 MCG (5000 UT) TABS Take 5,000 Units by mouth at bedtime.    [provider]  hydrocortisone (ANUSOL-HC) 25 MG suppository Place 1 suppository (25 mg total) rectally 2 (two) times daily. 03/21/19   Johnson, Megan P, DO  ibuprofen (ADVIL) 200 MG tablet Take 800 mg by mouth every 8 (eight) hours as needed (pain.).    [provider]  meloxicam (MOBIC) 15 MG tablet TAKE 1 TABLET BY MOUTH ONCE DAILY Patient not taking: Reported on 03/30/2019 03/21/19   Park Liter P, DO  polyethylene glycol powder (GLYCOLAX/MIRALAX) 17 GM/SCOOP powder Take 17 g by mouth 2 (two) times daily as needed. Patient not taking: Reported on 03/30/2019 03/21/19   Park Liter P, DO  tiZANidine (ZANAFLEX) 4 MG tablet Take 1 tablet (4 mg total) by mouth at bedtime. Patient not taking: Reported on  11/10/2018 06/25/18   Park Liter P, DO  vitamin B-12 (CYANOCOBALAMIN) 1000 MCG tablet Take 1,000 mcg by mouth at bedtime.    [provider]    Allergies as of 03/23/2019  . (No Known Allergies)    Family History  Problem Relation Age of Onset  . Hearing loss Father   . Hypertension Father   . Pneumonia Father   . Asthma Son   . Cancer Maternal Grandmother        Colon    Social History   Socioeconomic History  . Marital status: Married    Spouse name: Not on file  . Number of children: Not on file  . Years of education: Not on file  . Highest education level: Not on file  Occupational History  . Not on file  Social Needs  . Financial resource strain: Not on file  . Food insecurity     Worry: Not on file    Inability: Not on file  . Transportation needs    Medical: Not on file    Non-medical: Not on file  Tobacco Use  . Smoking status: Former Smoker    Years: 18.00    Quit date: 05/27/1995    Years since quitting: 23.8  . Smokeless tobacco: Never Used  Substance and Sexual Activity  . Alcohol use: Yes    Alcohol/week: 0.0 standard drinks    Comment: occassionally   . Drug use: No  . Sexual activity: Yes  Lifestyle  . Physical activity    Days per week: Not on file    Minutes per session: Not on file  . Stress: Not on file  Relationships  . Social Herbalist on phone: Not on file    Gets together: Not on file    Attends religious service: Not on file    Active member of club or organization: Not on file    Attends meetings of clubs or organizations: Not on file    Relationship status: Not on file  . Intimate partner violence    Fear of current or ex partner: Not on file    Emotionally abused: Not on file    Physically abused: Not on file    Forced sexual activity: Not on file  Other Topics Concern  . Not on file  Social History Narrative  . Not on file    Review of Systems: See HPI, otherwise negative ROS  Physical Exam: BP 123/89   Pulse 65   Temp (!) 96.7 F (35.9 C) (Tympanic)   Resp 20   Ht 5\' 10"  (1.778 m)   Wt 127 kg   SpO2 99%   BMI 40.18 kg/m  General:   Alert,  pleasant and cooperative in NAD Head:  Normocephalic and atraumatic. Neck:  Supple; no masses or thyromegaly. Lungs:  Clear throughout to auscultation, normal respiratory effort.    Heart:  +S1, +S2, Regular rate and rhythm, No edema. Abdomen:  Soft, nontender and nondistended. Normal bowel sounds, without guarding, and without rebound.   Neurologic:  Alert and  oriented x4;  grossly normal neurologically.  Impression/Plan: Curtis Ran. is here for an endoscopy and colonoscopy  to be performed for  evaluation of rectal bleeding    Risks, benefits,  limitations, and alternatives regarding endoscopy have been reviewed with the patient.  Questions have been answered.  All parties agreeable.   [name], MD  03/30/2019, 12:51 PM

## 2019-03-30 NOTE — Op Note (Signed)
[facility] Gastroenterology Patient Name: Curtis Bray Procedure Date: 03/30/2019 12:58 PM MRN: [id number] Account #: [id number] Date of Birth: [birth year] Admit Type: Outpatient Age: 58 Room: [facility] ENDO ROOM 4 Gender: Male Note Status: Finalized Procedure:             Upper GI endoscopy Indications:           Heme positive stool Providers:             [name] MD, MD Referring MD:          Valerie Roys (Referring MD) Medicines:             Monitored Anesthesia Care Complications:         No immediate complications. Procedure:             Pre-Anesthesia Assessment:                        - Prior to the procedure, a History and Physical was                         performed, and patient medications, allergies and                         sensitivities were reviewed. The patient's tolerance                         of previous anesthesia was reviewed.                        - The risks and benefits of the procedure and the                         sedation options and risks were discussed with the                         patient. All questions were answered and informed                         consent was obtained.                        - ASA Grade Assessment: II - A patient with mild                         systemic disease.                        After obtaining informed consent, the endoscope was                         passed under direct vision. Throughout the procedure,                         the patient's blood pressure, pulse, and oxygen                         saturations were monitored continuously. The Endoscope                         was introduced through the mouth, and  advanced to the                         third part of duodenum. The upper GI endoscopy was                         accomplished with ease. The patient tolerated the                         procedure well. Findings:      The examined duodenum was normal.      The stomach was  normal.      The cardia and gastric fundus were normal on retroflexion.      Two tongues of salmon-colored mucosa were present. No other visible       abnormalities were present. The maximum longitudinal extent of these       esophageal mucosal changes was 1 cm in length. Biopsies were taken with       a cold forceps for histology.      The exam was otherwise without abnormality. Impression:            - Normal examined duodenum.                        - Normal stomach.                        - Salmon-colored mucosa suspicious for short-segment                         Barrett's esophagus. Biopsied.                        - The examination was otherwise normal. Recommendation:        - Await pathology results.                        - Discharge patient to home (with escort).                        - Resume previous diet.                        - Continue present medications.                        - Perform a colonoscopy today. Procedure Code(s):     --- Professional ---                        [phone], Esophagogastroduodenoscopy, flexible,                         transoral; with biopsy, single or multiple Diagnosis Code(s):     --- Professional ---                        K22.8, Other specified diseases of esophagus                        R19.5, Other fecal abnormalities CPT copyright 2019 American Medical Association. All rights reserved. The codes documented in this report are preliminary and upon coder review  may  be revised to meet current compliance requirements. [name], MD [name] MD, MD 03/30/2019 1:12:12 PM This report has been signed electronically. Number of Addenda: 0 Note Initiated On: 03/30/2019 12:58 PM Estimated Blood Loss:  Estimated blood loss: none.      [facility]

## 2019-03-30 NOTE — Anesthesia Procedure Notes (Signed)
Date/Time: 03/30/2019 12:30 PM Performed by: Johnna Acosta, CRNA Pre-anesthesia Checklist: Patient identified, Emergency Drugs available, Suction available, Patient being monitored and Timeout performed Patient Re-evaluated:Patient Re-evaluated prior to induction Oxygen Delivery Method: Nasal cannula Preoxygenation: Pre-oxygenation with 100% oxygen Induction Type: IV induction

## 2019-03-30 NOTE — Anesthesia Post-op Follow-up Note (Signed)
Anesthesia QCDR form completed.        

## 2019-03-30 NOTE — Transfer of Care (Signed)
Immediate Anesthesia Transfer of Care Note  Patient: Claybourne Boring.  Procedure(s) Performed: COLONOSCOPY WITH PROPOFOL (N/A ) ESOPHAGOGASTRODUODENOSCOPY (EGD) WITH PROPOFOL (N/A )  Patient Location: PACU  Anesthesia Type:General  Level of Consciousness: sedated  Airway & Oxygen Therapy: Patient Spontanous Breathing  Post-op Assessment: Report given to RN and Post -op Vital signs reviewed and stable  Post vital signs: Reviewed and stable  Last Vitals:  Vitals Value Taken Time  BP 111/75 03/30/19 1332  Temp 36.2 C 03/30/19 1331  Pulse 83 03/30/19 1333  Resp 18 03/30/19 1333  SpO2 96 % 03/30/19 1333  Vitals shown include unvalidated device data.  Last Pain:  Vitals:   03/30/19 1331  TempSrc: Temporal  PainSc:          Complications: No apparent anesthesia complications

## 2019-03-30 NOTE — Op Note (Signed)
[facility] Gastroenterology Patient Name: Curtis Bray Procedure Date: 03/30/2019 12:56 PM MRN: [id number] Account #: [id number] Date of Birth: 09/14/1960 Admit Type: Outpatient Age: 58 Room: [facility] ENDO ROOM 4 Gender: Male Note Status: Finalized Procedure:             Colonoscopy Indications:           Gastrointestinal occult blood loss Providers:             [name] MD, MD Referring MD:          Valerie Roys (Referring MD) Medicines:             Monitored Anesthesia Care Complications:         No immediate complications. Procedure:             Pre-Anesthesia Assessment:                        - Prior to the procedure, a History and Physical was                         performed, and patient medications, allergies and                         sensitivities were reviewed. The patient's tolerance                         of previous anesthesia was reviewed.                        - The risks and benefits of the procedure and the                         sedation options and risks were discussed with the                         patient. All questions were answered and informed                         consent was obtained.                        - ASA Grade Assessment: II - A patient with mild                         systemic disease.                        After obtaining informed consent, the colonoscope was                         passed under direct vision. Throughout the procedure,                         the patient's blood pressure, pulse, and oxygen                         saturations were monitored continuously. The                         Colonoscope was introduced through the anus and  advanced to the the cecum, identified by the                         appendiceal orifice. The colonoscopy was performed                         with ease. The patient tolerated the procedure well.                         The quality of the bowel  preparation was excellent. Findings:      The perianal and digital rectal examinations were normal.      Non-bleeding internal hemorrhoids were found during retroflexion. The       hemorrhoids were medium-sized and Grade I (internal hemorrhoids that do       not prolapse).      Multiple medium-mouthed diverticula were found in the sigmoid colon.      The exam was otherwise without abnormality on direct and retroflexion       views. Impression:            - Non-bleeding internal hemorrhoids.                        - Diverticulosis in the sigmoid colon.                        - The examination was otherwise normal on direct and                         retroflexion views.                        - No specimens collected. Recommendation:        - Discharge patient to home (with escort).                        - Resume previous diet.                        - Continue present medications.                        - Repeat colonoscopy in 5 years for screening purposes. Procedure Code(s):     --- Professional ---                        [phone], Colonoscopy, flexible; diagnostic, including                         collection of specimen(s) by brushing or washing, when                         performed (separate procedure) Diagnosis Code(s):     --- Professional ---                        K64.0, First degree hemorrhoids                        R19.5, Other fecal abnormalities  K57.30, Diverticulosis of large intestine without                         perforation or abscess without bleeding CPT copyright 2019 [employer]. All rights reserved. The codes documented in this report are preliminary and upon coder review may  be revised to meet current compliance requirements. [name], MD [name] MD, MD 03/30/2019 1:29:51 PM This report has been signed electronically. Number of Addenda: 0 Note Initiated On: 03/30/2019 12:56 PM Scope Withdrawal Time: 0 hours 9  minutes 21 seconds  Total Procedure Duration: 0 hours 13 minutes 12 seconds  Estimated Blood Loss:  Estimated blood loss: none.      [facility]

## 2019-03-31 ENCOUNTER — Encounter: Payer: Self-pay | Admitting: Gastroenterology

## 2019-03-31 LAB — SURGICAL PATHOLOGY

## 2019-03-31 NOTE — Anesthesia Postprocedure Evaluation (Signed)
Anesthesia Post Note  Patient: [place] Salzberg.  Procedure(s) Performed: COLONOSCOPY WITH PROPOFOL (N/A ) ESOPHAGOGASTRODUODENOSCOPY (EGD) WITH PROPOFOL (N/A )  Patient location during evaluation: Endoscopy Anesthesia Type: General Level of consciousness: awake and alert Pain management: pain level controlled Vital Signs Assessment: post-procedure vital signs reviewed and stable Respiratory status: spontaneous breathing, nonlabored ventilation, respiratory function stable and patient connected to nasal cannula oxygen Cardiovascular status: blood pressure returned to baseline and stable Postop Assessment: no apparent nausea or vomiting Anesthetic complications: no     Last Vitals:  Vitals:   03/30/19 1351 03/30/19 1401  BP: 112/75 114/70  Pulse: 69   Resp: 17   Temp:    SpO2: 100% 100%    Last Pain:  Vitals:   03/31/19 0720  TempSrc:   PainSc: 0-No pain                 Martha Clan

## 2019-04-05 ENCOUNTER — Encounter: Payer: Self-pay | Admitting: Gastroenterology

## 2019-04-11 ENCOUNTER — Encounter: Payer: Self-pay | Admitting: Urology

## 2019-04-11 ENCOUNTER — Ambulatory Visit (INDEPENDENT_AMBULATORY_CARE_PROVIDER_SITE_OTHER): Payer: BC Managed Care – PPO | Admitting: Urology

## 2019-04-11 ENCOUNTER — Other Ambulatory Visit: Payer: Self-pay

## 2019-04-11 VITALS — BP 120/81 | HR 80 | Ht 70.5 in | Wt 287.1 lb

## 2019-04-11 DIAGNOSIS — C61 Malignant neoplasm of prostate: Secondary | ICD-10-CM

## 2019-04-11 MED ORDER — LEUPROLIDE ACETATE (6 MONTH) 45 MG ~~LOC~~ KIT
45.0000 mg | PACK | Freq: Once | SUBCUTANEOUS | Status: AC
  Administered 2019-04-11: 16:00:00 45 mg via SUBCUTANEOUS

## 2019-04-11 MED ORDER — TAMSULOSIN HCL 0.4 MG PO CAPS: 0.4000 mg | ORAL_CAPSULE | Freq: Every day | ORAL | 11 refills | Status: DC

## 2019-04-11 NOTE — Progress Notes (Signed)
   04/11/2019 3:51 PM   Curtis Bray. [birth year] [id number]  Reason for visit: Follow up high risk prostate cancer  HPI: I saw Curtis Bray back in urology clinic today for high risk prostate cancer.  He is a 58 year old male with obesity who was diagnosed with high volume, high risk prostate cancer in March 2020 when he presented with an elevated PSA of 6.7.  Staging imaging was negative.  He underwent his first Lupron injection in May 2020, and underwent brachytherapy on 12/13/2018.  He has not yet had a post-treatment PSA check.  He has an upcoming visit with Dr. Baruch Gouty.  He reports overall he is doing well.  He reports some difficulty urinating with weak stream and difficulty initiating the stream since undergoing brachytherapy.  He also reports some depression and apathy on Lupron.  He denies any gross hematuria, UTIs, or bone pain.  We had a long discussion about his symptoms, high risk prostate cancer, and side effects from brachytherapy.  I recommended a trial of Flomax for his urinary symptoms.  He was amenable to continuing ADT, and another 20-month Lupron shot was given today.  We discussed the risks of ADT at length.  PSA today, call with results, Lupron 73-month injection given today RTC 6 months with PSA prior.  Receive Lupron injection 3 of 4 at that visit  A total of 15 minutes were spent face-to-face with the patient, greater than 50% was spent in patient education, counseling, and coordination of care regarding prostate cancer, brachytherapy, and ADT.  Billey Co, [facility] [address] [place], [place] [address] [phone]

## 2019-04-11 NOTE — Addendum Note (Signed)
Addended by: Donalee Citrin on: 04/11/2019 04:18 PM   Modules accepted: Orders

## 2019-04-12 ENCOUNTER — Telehealth: Payer: Self-pay

## 2019-04-12 LAB — PSA: Prostate Specific Ag, Serum: <0.1 ng/mL (ref 0.0–4.0)

## 2019-04-12 NOTE — Progress Notes (Signed)
Eligard SubQ Injection   Due to Prostate Cancer patient is present today for a Eligard Injection.  Medication: Eligard 6 month Dose: 45 mg  Location: right arm  Lot: [id number] Exp: 10/24/2020  Patient tolerated well, no complications were noted  Performed by: Gordy Clement,  [place]

## 2019-04-12 NOTE — Telephone Encounter (Signed)
Called pt informed him of the information below. Pt gave verbal understanding.  

## 2019-04-12 NOTE — Telephone Encounter (Signed)
-----   Message from Billey Co, MD sent at 04/12/2019  8:20 AM EST ----- Great news, PSA is 0 after radiation for prostate cancer. Keep follow up with Dr. Baruch Gouty and me as scheduled  Nickolas Madrid, MD 04/12/2019

## 2019-04-20 ENCOUNTER — Other Ambulatory Visit: Payer: Self-pay

## 2019-04-20 ENCOUNTER — Inpatient Hospital Stay: Payer: BC Managed Care – PPO | Attending: Radiation Oncology

## 2019-04-20 DIAGNOSIS — C61 Malignant neoplasm of prostate: Secondary | ICD-10-CM | POA: Insufficient documentation

## 2019-04-20 LAB — PSA: Prostatic Specific Antigen: 0.03 ng/mL (ref 0.00–4.00)

## 2019-04-27 ENCOUNTER — Encounter: Payer: Self-pay | Admitting: Radiation Oncology

## 2019-04-27 ENCOUNTER — Other Ambulatory Visit: Payer: Self-pay

## 2019-04-27 ENCOUNTER — Ambulatory Visit
Admission: RE | Admit: 2019-04-27 | Discharge: 2019-04-27 | Disposition: A | Discharge status: Home / Self Care | Payer: BC Managed Care – PPO | Source: Ambulatory Visit | Attending: Radiation Oncology | Admitting: Radiation Oncology

## 2019-04-27 DIAGNOSIS — Z923 Personal history of irradiation: Secondary | ICD-10-CM | POA: Insufficient documentation

## 2019-04-27 DIAGNOSIS — C61 Malignant neoplasm of prostate: Secondary | ICD-10-CM | POA: Diagnosis present

## 2019-04-27 DIAGNOSIS — R351 Nocturia: Secondary | ICD-10-CM | POA: Insufficient documentation

## 2019-04-27 NOTE — Progress Notes (Signed)
Radiation Oncology Follow up Note  Name: Curtis Bray.   Date:   04/27/2019 MRN:  [id number] DOB: 18-Dec-1960    This 58 y.o. male presents to the clinic today for 55-month follow-up status post I-125 interstitial implant for Gleason 7 (3+4) adenocarcinoma the prostate presenting with a PSA of 6.7..  REFERRING PROVIDER: Valerie Roys, DO  HPI: Patient is a 58 year old male now out 4 months having completed I I-125 interstitial implant for Gleason 7 adenocarcinoma seen today in routine follow-up he is doing well specifically denies diarrhea dysuria or any other GI/GU complaints.  He has been on androgen deprivation therapy.  Most recent PSA is 0.03.Marland Kitchen  He has recently started Flomax which is helping some of his nocturia.  COMPLICATIONS OF TREATMENT: none  FOLLOW UP COMPLIANCE: keeps appointments   PHYSICAL EXAM:  BP (P) 133/82 (BP Location: Left Arm, Patient Position: Sitting)   Pulse (P) 71   Temp (P) 98 F (36.7 C) (Tympanic)   Resp (P) 16   Wt (P) 290 lb 12.8 oz (131.9 kg)   BMI (P) 41.14 kg/m  Well-developed well-nourished patient in NAD. HEENT reveals PERLA, EOMI, discs not visualized.  Oral cavity is clear. No oral mucosal lesions are identified. Neck is clear without evidence of cervical or supraclavicular adenopathy. Lungs are clear to A&P. Cardiac examination is essentially unremarkable with regular rate and rhythm without murmur rub or thrill. Abdomen is benign with no organomegaly or masses noted. Motor sensory and DTR levels are equal and symmetric in the upper and lower extremities. Cranial nerves II through XII are grossly intact. Proprioception is intact. No peripheral adenopathy or edema is identified. No motor or sensory levels are noted. Crude visual fields are within normal range.  RADIOLOGY RESULTS: No current films to review  PLAN: Present time patient is doing well under excellent biochemical control of his prostate cancer.  He continues androgen deprivation  therapy through urology.  I have asked to see him back in 6 months for follow-up.  Patient knows to call with any concerns at any time.  I would like to take this opportunity to thank you for allowing me to participate in the care of your patient.Noreene Filbert, MD

## 2019-04-29 ENCOUNTER — Encounter: Payer: Self-pay | Admitting: Family Medicine

## 2019-04-29 ENCOUNTER — Ambulatory Visit (INDEPENDENT_AMBULATORY_CARE_PROVIDER_SITE_OTHER): Payer: BC Managed Care – PPO | Admitting: Family Medicine

## 2019-04-29 ENCOUNTER — Other Ambulatory Visit: Payer: Self-pay

## 2019-04-29 VITALS — BP 122/78 | HR 68 | Temp 98.3°F | Ht 70.75 in | Wt 282.4 lb

## 2019-04-29 DIAGNOSIS — F4323 Adjustment disorder with mixed anxiety and depressed mood: Secondary | ICD-10-CM | POA: Diagnosis not present

## 2019-04-29 DIAGNOSIS — K219 Gastro-esophageal reflux disease without esophagitis: Secondary | ICD-10-CM

## 2019-04-29 DIAGNOSIS — Z0000 Encounter for general adult medical examination without abnormal findings: Secondary | ICD-10-CM

## 2019-04-29 DIAGNOSIS — E782 Mixed hyperlipidemia: Secondary | ICD-10-CM | POA: Diagnosis not present

## 2019-04-29 DIAGNOSIS — C61 Malignant neoplasm of prostate: Secondary | ICD-10-CM

## 2019-04-29 LAB — UA/M W/RFLX CULTURE, ROUTINE
Bilirubin, UA: NEGATIVE
Glucose, UA: NEGATIVE
Ketones, UA: NEGATIVE
Leukocytes,UA: NEGATIVE
Nitrite, UA: NEGATIVE
Protein,UA: NEGATIVE
RBC, UA: NEGATIVE
Specific Gravity, UA: >1.03 — ABNORMAL HIGH (ref 1.005–1.030)
Urobilinogen, Ur: 0.2 mg/dL (ref 0.2–1.0)
pH, UA: 6.5 (ref 5.0–7.5)

## 2019-04-29 MED ORDER — SIMVASTATIN 40 MG PO TABS: 40.0000 mg | ORAL_TABLET | Freq: Every day | ORAL | 1 refills | Status: DC

## 2019-04-29 MED ORDER — RABEPRAZOLE SODIUM 20 MG PO TBEC: 40.0000 mg | DELAYED_RELEASE_TABLET | Freq: Every day | ORAL | 3 refills | Status: DC

## 2019-04-29 MED ORDER — BUPROPION HCL ER (SR) 150 MG PO TB12: ORAL_TABLET | ORAL | 2 refills | Status: DC

## 2019-04-29 MED ORDER — GABAPENTIN 300 MG PO CAPS: 300.0000 mg | ORAL_CAPSULE | Freq: Every day | ORAL | 1 refills | Status: DC

## 2019-04-29 MED ORDER — TRAZODONE HCL 50 MG PO TABS: 100.0000 mg | ORAL_TABLET | Freq: Every day | ORAL | 3 refills | Status: DC

## 2019-04-29 NOTE — Assessment & Plan Note (Signed)
Continue to follow with urology. On hormone shots. Call with any concerns.

## 2019-04-29 NOTE — Assessment & Plan Note (Signed)
Under good control on current regimen. Continue current regimen. Continue to monitor. Call with any concerns. Refills given. Labs checked today.  

## 2019-04-29 NOTE — Progress Notes (Signed)
   There were no vitals taken for this visit.   Subjective:    Patient ID: Curtis Ran., male    DOB: 05/27/1960, 58 y.o.   MRN: [id number]  HPI: Curtis Cuadras. is a 58 y.o. male  No chief complaint on file.   Relevant past medical, surgical, family and social history reviewed and updated as indicated. Interim medical history since our last visit reviewed. Allergies and medications reviewed and updated.  Review of Systems  Per HPI unless specifically indicated above     Objective:    There were no vitals taken for this visit.  Wt Readings from Last 3 Encounters:  04/27/19 (P) 290 lb 12.8 oz (131.9 kg)  04/11/19 287 lb 1.6 oz (130.2 kg)  03/30/19 280 lb (127 kg)    Physical Exam  Results for orders placed or performed in visit on 04/20/19  PSA  Result Value Ref Range   Prostatic Specific Antigen 0.03 0.00 - 4.00 ng/mL      Assessment & Plan:   Problem List Items Addressed This Visit      Other   Hyperlipidemia - Primary    Other Visit Diagnoses    Routine general medical examination at a health care facility           Follow up plan: No follow-ups on file.

## 2019-04-29 NOTE — Progress Notes (Signed)
BP 122/78   Pulse 68   Temp 98.3 F (36.8 C)   Ht 5' 10.75" (1.797 m)   Wt 282 lb 6 oz (128.1 kg)   SpO2 98%   BMI 39.66 kg/m    Subjective:    Patient ID: Curtis Ran., male    DOB: 1961-03-30, 58 y.o.   MRN: [id number]  HPI: Curtis [place]. is a 58 y.o. male presenting on 04/29/2019 for comprehensive medical examination. Current medical complaints include:  Has been having trouble concentrating and has been having less interest in things. That has been going on for about 6 months. 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  Depression screen [facility] 2/9 04/26/2018 03/24/2017 03/18/2016 03/14/2015  Decreased Interest 0 0 0 0  Down, Depressed, Hopeless 0 0 0 1  PHQ - 2 Score 0 0 0 1  Altered sleeping 0 - - 0  Tired, decreased energy 1 - - 0  Change in appetite 0 - - 1  Feeling bad or failure about yourself  0 - - 0  Trouble concentrating 0 - - 0  Moving slowly or fidgety/restless 0 - - 0  Suicidal thoughts 0 - - 0  PHQ-9 Score 1 - - 2  Difficult doing work/chores Not difficult at all - - Not difficult at all   HYPERLIPIDEMIA Hyperlipidemia status: stable Satisfied with current treatment?  no Side effects:  no Medication compliance: excellent compliance Past cholesterol meds: simvastatin Supplements: none Aspirin:  yes The 10-year ASCVD risk score [name], et al., 2013) is: 5.4%   Values used to calculate the score:     Age: 55 years     Sex: Male     Is Non-Hispanic African American: No     Diabetic: No     Tobacco smoker: No     Systolic Blood Pressure: 683 mmHg     Is BP treated: No     HDL Cholesterol: 49 mg/dL     Total Cholesterol: 157 mg/dL Chest pain:  no Coronary artery disease:  no  GERD GERD control status: stable  Satisfied with current treatment? yes Heartburn frequency: occasionally Medication side effects: no  Medication compliance: stable Dysphagia: no  Odynophagia:  no Hematemesis: no Blood in stool: no EGD: yes  He currently lives with: wife Interim Problems from his last visit: no  Depression Screen done today and results listed below:  Depression screen [facility] 2/9 04/26/2018 03/24/2017 03/18/2016 03/14/2015  Decreased Interest 0 0 0 0  Down, Depressed, Hopeless 0 0 0 1  PHQ - 2 Score 0 0 0 1  Altered sleeping 0 - - 0  Tired, decreased energy 1 - - 0  Change in appetite 0 - - 1  Feeling bad or failure about yourself  0 - - 0  Trouble concentrating 0 - - 0  Moving slowly or fidgety/restless 0 - - 0  Suicidal thoughts 0 - - 0  PHQ-9 Score 1 - - 2  Difficult doing work/chores Not difficult at all - - Not difficult at all    Past Medical History:  Past Medical History:  Diagnosis Date  . Allergy   . Cancer [facility])    Prostate  . Carpal tunnel syndrome   . DDD (degenerative disc disease), cervical   . DDD (degenerative disc disease), cervical   . Deafness in left ear   . Edema   . GERD (  gastroesophageal reflux disease)   . Hyperlipidemia   . Personal history of colonic polyps   . Pinched nerve    upper and lower back  . Sleep apnea    Bi-pap  . Sleep apnea   . TIA (transient ischemic attack) 07/2011  . Vision impairment    Blindness Right Eye    Surgical History:  Past Surgical History:  Procedure Laterality Date  . APPENDECTOMY    . carpel tunnel Bilateral   . COLONOSCOPY  2015   Found 2 beign polyps, repeat 10 years  . COLONOSCOPY WITH PROPOFOL N/A 03/30/2019   Procedure: COLONOSCOPY WITH PROPOFOL;  Surgeon: [name], MD;  Location: [facility] ENDOSCOPY;  Service: Gastroenterology;  Laterality: N/A;  . ESOPHAGOGASTRODUODENOSCOPY (EGD) WITH PROPOFOL N/A 03/30/2019   Procedure: ESOPHAGOGASTRODUODENOSCOPY (EGD) WITH PROPOFOL;  Surgeon: [name], MD;  Location: [facility] ENDOSCOPY;  Service: Gastroenterology;  Laterality: N/A;  . RADIOACTIVE SEED IMPLANT N/A 12/13/2018   Procedure: RADIOACTIVE SEED IMPLANT/BRACHYTHERAPY  IMPLANT;  Surgeon: Billey Co, MD;  Location: [facility] ORS;  Service: Urology;  Laterality: N/A;  . TONSILLECTOMY      Medications:  Current Outpatient Medications on File Prior to Visit  Medication Sig  . ASPIR-LOW 81 MG EC tablet   . CALCIUM PO Take 1 tablet by mouth 2 (two) times a day.  . Cholecalciferol (VITAMIN D-3) 125 MCG (5000 UT) TABS Take 5,000 Units by mouth at bedtime.  . Coenzyme Q10 (COQ10) 200 MG CAPS Take 200 mg by mouth at bedtime.  . hydrocortisone (ANUSOL-HC) 25 MG suppository Place 1 suppository (25 mg total) rectally 2 (two) times daily.  Marland Kitchen ibuprofen (ADVIL) 200 MG tablet Take 800 mg by mouth every 8 (eight) hours as needed (pain.).  Marland Kitchen pseudoephedrine (SUDAFED) 30 MG tablet Take 60 mg by mouth at bedtime.  . tamsulosin (FLOMAX) 0.4 MG CAPS capsule Take 1 capsule (0.4 mg total) by mouth daily.  . vitamin B-12 (CYANOCOBALAMIN) 1000 MCG tablet Take 1,000 mcg by mouth at bedtime.   No current facility-administered medications on file prior to visit.     Allergies:  No Known Allergies  Social History:  Social History   Socioeconomic History  . Marital status: Married    Spouse name: Not on file  . Number of children: Not on file  . Years of education: Not on file  . Highest education level: Not on file  Occupational History  . Not on file  Social Needs  . Financial resource strain: Not on file  . Food insecurity    Worry: Not on file    Inability: Not on file  . Transportation needs    Medical: Not on file    Non-medical: Not on file  Tobacco Use  . Smoking status: Former Smoker    Years: 18.00    Quit date: 05/27/1995    Years since quitting: 23.9  . Smokeless tobacco: Never Used  Substance and Sexual Activity  . Alcohol use: Yes    Alcohol/week: 0.0 standard drinks    Comment: occassionally   . Drug use: No  . Sexual activity: Yes  Lifestyle  . Physical activity    Days per week: Not on file    Minutes per session: Not on file  . Stress:  Not on file  Relationships  . Social Herbalist on phone: Not on file    Gets together: Not on file    Attends religious service: Not on file    Active member of club  or organization: Not on file    Attends meetings of clubs or organizations: Not on file    Relationship status: Not on file  . Intimate partner violence    Fear of current or ex partner: Not on file    Emotionally abused: Not on file    Physically abused: Not on file    Forced sexual activity: Not on file  Other Topics Concern  . Not on file  Social History Narrative  . Not on file   Social History   Tobacco Use  Smoking Status Former Smoker  . Years: 18.00  . Quit date: 05/27/1995  . Years since quitting: 23.9  Smokeless Tobacco Never Used   Social History   Substance and Sexual Activity  Alcohol Use Yes  . Alcohol/week: 0.0 standard drinks   Comment: occassionally     Family History:  Family History  Problem Relation Age of Onset  . Hearing loss Father   . Hypertension Father   . Pneumonia Father   . Asthma Son   . Cancer Maternal Grandmother        Colon    Past medical history, surgical history, medications, allergies, family history and social history reviewed with patient today and changes made to appropriate areas of the chart.   Review of Systems  Constitutional: Positive for malaise/fatigue. Negative for chills, diaphoresis, fever and weight loss.  HENT: Positive for congestion, hearing loss and tinnitus. Negative for ear discharge, ear pain, nosebleeds, sinus pain and sore throat.   Respiratory: Positive for cough and shortness of breath. Negative for hemoptysis, sputum production, wheezing and stridor.   Cardiovascular: Positive for leg swelling. Negative for chest pain, palpitations, orthopnea, claudication and PND.  Gastrointestinal: Positive for blood in stool and constipation. Negative for abdominal pain, diarrhea, heartburn, melena, nausea and vomiting.  Genitourinary:  Negative.   Musculoskeletal: Positive for back pain, myalgias and neck pain. Negative for falls and joint pain.  Skin: Negative.   Neurological: Positive for tingling. Negative for dizziness, tremors, sensory change, speech change, focal weakness, seizures, loss of consciousness, weakness and headaches.  Endo/Heme/Allergies: Positive for environmental allergies. Negative for polydipsia. Does not bruise/bleed easily.  Psychiatric/Behavioral: Positive for depression. Negative for hallucinations, memory loss, substance abuse and suicidal ideas. The patient is not nervous/anxious and does not have insomnia.     All other ROS negative except what is listed above and in the HPI.      Objective:    BP 122/78   Pulse 68   Temp 98.3 F (36.8 C)   Ht 5' 10.75" (1.797 m)   Wt 282 lb 6 oz (128.1 kg)   SpO2 98%   BMI 39.66 kg/m   Wt Readings from Last 3 Encounters:  04/29/19 282 lb 6 oz (128.1 kg)  04/27/19 (P) 290 lb 12.8 oz (131.9 kg)  04/11/19 287 lb 1.6 oz (130.2 kg)    Physical Exam Vitals signs and nursing note reviewed.  Constitutional:      General: He is not in acute distress.    Appearance: Normal appearance. He is obese. He is not ill-appearing, toxic-appearing or diaphoretic.  HENT:     Head: Normocephalic and atraumatic.     Right Ear: Tympanic membrane, ear canal and external ear normal. There is no impacted cerumen.     Left Ear: Tympanic membrane, ear canal and external ear normal. There is no impacted cerumen.     Nose: Nose normal. No congestion or rhinorrhea.     Mouth/Throat:  Mouth: Mucous membranes are moist.     Pharynx: Oropharynx is clear. No oropharyngeal exudate or posterior oropharyngeal erythema.  Eyes:     General: No scleral icterus.       Right eye: No discharge.        Left eye: No discharge.     Extraocular Movements: Extraocular movements intact.     Conjunctiva/sclera: Conjunctivae normal.     Pupils: Pupils are equal, round, and reactive to  light.  Neck:     Musculoskeletal: Normal range of motion and neck supple. No neck rigidity or muscular tenderness.     Vascular: No carotid bruit.  Cardiovascular:     Rate and Rhythm: Normal rate and regular rhythm.     Pulses: Normal pulses.     Heart sounds: No murmur. No friction rub. No gallop.   Pulmonary:     Effort: Pulmonary effort is normal. No respiratory distress.     Breath sounds: Normal breath sounds. No stridor. No wheezing, rhonchi or rales.  Chest:     Chest wall: No tenderness.  Abdominal:     General: Abdomen is flat. Bowel sounds are normal. There is no distension.     Palpations: Abdomen is soft. There is no mass.     Tenderness: There is no abdominal tenderness. There is no right CVA tenderness, left CVA tenderness, guarding or rebound.     Hernia: No hernia is present.  Genitourinary:    Comments: Genital exam deferred with shared decision making Musculoskeletal:        General: No swelling, tenderness, deformity or signs of injury.     Right lower leg: No edema.     Left lower leg: No edema.  Lymphadenopathy:     Cervical: No cervical adenopathy.  Skin:    General: Skin is warm and dry.     Capillary Refill: Capillary refill takes less than 2 seconds.     Coloration: Skin is not jaundiced or pale.     Findings: No bruising, erythema, lesion or rash.  Neurological:     General: No focal deficit present.     Mental Status: He is alert and oriented to person, place, and time.     Cranial Nerves: No cranial nerve deficit.     Sensory: No sensory deficit.     Motor: No weakness.     Coordination: Coordination normal.     Gait: Gait normal.     Deep Tendon Reflexes: Reflexes normal.  Psychiatric:        Mood and Affect: Mood normal.        Behavior: Behavior normal.        Thought Content: Thought content normal.        Judgment: Judgment normal.     Results for orders placed or performed in visit on 04/20/19  PSA  Result Value Ref Range    Prostatic Specific Antigen 0.03 0.00 - 4.00 ng/mL      Assessment & Plan:   Problem List Items Addressed This Visit      Digestive   GERD (gastroesophageal reflux disease)    Under good control on current regimen. Continue current regimen. Continue to monitor. Call with any concerns. Refills given. Labs checked today.       Relevant Medications   RABEprazole (ACIPHEX) 20 MG tablet     Genitourinary   Malignant neoplasm of prostate ([place])    Continue to follow with urology. On hormone shots. Call with any concerns.  Other   Hyperlipidemia    Under good control on current regimen. Continue current regimen. Continue to monitor. Call with any concerns. Refills given. Labs drawn today.       Relevant Medications   simvastatin (ZOCOR) 40 MG tablet   Other Relevant Orders   Lipid Panel w/o Chol/HDL Ratio OUT    Other Visit Diagnoses    Routine general medical examination at a health care facility    -  Primary   Vaccines up to date. Screening labs checked today. Colonoscopy up to date. Continue diet and exercise. Call with any concerns.    Relevant Orders   CBC with Differential OUT   Comp Met (CMET)   Lipid Panel w/o Chol/HDL Ratio OUT   TSH   UA/M w/rflx Culture, Routine   HIV antibody (with reflex)   Adjustment disorder with mixed anxiety and depressed mood       Will start wellbutrin and recheck 1 month. Call with any concerns.        LABORATORY TESTING:  Health maintenance labs ordered today as discussed above.   IMMUNIZATIONS:   - Tdap: Tetanus vaccination status reviewed: last tetanus booster within 10 years. - Influenza: Up to date  SCREENING: - Colonoscopy: Up to date  Discussed with patient purpose of the colonoscopy is to detect colon cancer at curable precancerous or early stages   PATIENT COUNSELING:    Sexuality: Discussed sexually transmitted diseases, partner selection, use of condoms, avoidance of unintended pregnancy  and contraceptive  alternatives.   Advised to avoid cigarette smoking.  I discussed with the patient that most people either abstain from alcohol or drink within safe limits (<=14/week and <=4 drinks/occasion for males, <=7/weeks and <= 3 drinks/occasion for females) and that the risk for alcohol disorders and other health effects rises proportionally with the number of drinks per week and how often a drinker exceeds daily limits.  Discussed cessation/primary prevention of drug use and availability of treatment for abuse.   Diet: Encouraged to adjust caloric intake to maintain  or achieve ideal body weight, to reduce intake of dietary saturated fat and total fat, to limit sodium intake by avoiding high sodium foods and not adding table salt, and to maintain adequate dietary potassium and calcium preferably from fresh fruits, vegetables, and low-fat dairy products.    stressed the importance of regular exercise  Injury prevention: Discussed safety belts, safety helmets, smoke detector, smoking near bedding or upholstery.   Dental health: Discussed importance of regular tooth brushing, flossing, and dental visits.   Follow up plan: NEXT PREVENTATIVE PHYSICAL DUE IN 1 YEAR. Return in about 4 weeks (around 05/27/2019).

## 2019-04-29 NOTE — Assessment & Plan Note (Signed)
Under good control on current regimen. Continue current regimen. Continue to monitor. Call with any concerns. Refills given. Labs drawn today.   

## 2019-04-29 NOTE — Patient Instructions (Signed)

## 2019-04-30 LAB — CBC WITH DIFFERENTIAL/PLATELET
Basophils Absolute: 0.1 10*3/uL (ref 0.0–0.2)
Basos: 1 %
EOS (ABSOLUTE): 0.2 10*3/uL (ref 0.0–0.4)
Eos: 3 %
Hematocrit: 43.4 % (ref 37.5–51.0)
Hemoglobin: 14.6 g/dL (ref 13.0–17.7)
Immature Grans (Abs): 0 10*3/uL (ref 0.0–0.1)
Immature Granulocytes: 0 %
Lymphocytes Absolute: 2 10*3/uL (ref 0.7–3.1)
Lymphs: 35 %
MCH: 30 pg (ref 26.6–33.0)
MCHC: 33.6 g/dL (ref 31.5–35.7)
MCV: 89 fL (ref 79–97)
Monocytes Absolute: 0.5 10*3/uL (ref 0.1–0.9)
Monocytes: 8 %
Neutrophils Absolute: 3 10*3/uL (ref 1.4–7.0)
Neutrophils: 53 %
Platelets: 191 10*3/uL (ref 150–450)
RBC: 4.87 x10E6/uL (ref 4.14–5.80)
RDW: 12.4 % (ref 11.6–15.4)
WBC: 5.7 10*3/uL (ref 3.4–10.8)

## 2019-04-30 LAB — COMPREHENSIVE METABOLIC PANEL
ALT: 30 IU/L (ref 0–44)
AST: 21 IU/L (ref 0–40)
Albumin/Globulin Ratio: 2.3 — ABNORMAL HIGH (ref 1.2–2.2)
Albumin: 4.4 g/dL (ref 3.8–4.9)
Alkaline Phosphatase: 97 IU/L (ref 39–117)
BUN/Creatinine Ratio: 15 (ref 9–20)
BUN: 15 mg/dL (ref 6–24)
Bilirubin Total: 0.4 mg/dL (ref 0.0–1.2)
CO2: 24 mmol/L (ref 20–29)
Calcium: 9.8 mg/dL (ref 8.7–10.2)
Chloride: 104 mmol/L (ref 96–106)
Creatinine, Ser: 1 mg/dL (ref 0.76–1.27)
GFR calc Af Amer: 95 mL/min/{1.73_m2} (ref >59)
GFR calc non Af Amer: 83 mL/min/{1.73_m2} (ref >59)
Globulin, Total: 1.9 g/dL (ref 1.5–4.5)
Glucose: 105 mg/dL — ABNORMAL HIGH (ref 65–99)
Potassium: 4.5 mmol/L (ref 3.5–5.2)
Sodium: 141 mmol/L (ref 134–144)
Total Protein: 6.3 g/dL (ref 6.0–8.5)

## 2019-04-30 LAB — LIPID PANEL W/O CHOL/HDL RATIO
Cholesterol, Total: 144 mg/dL (ref 100–199)
HDL: 38 mg/dL — ABNORMAL LOW (ref >39)
LDL Chol Calc (NIH): 62 mg/dL (ref 0–99)
Triglycerides: 277 mg/dL — ABNORMAL HIGH (ref 0–149)
VLDL Cholesterol Cal: 44 mg/dL — ABNORMAL HIGH (ref 5–40)

## 2019-04-30 LAB — TSH: TSH: 1.88 u[IU]/mL (ref 0.450–4.500)

## 2019-05-09 ENCOUNTER — Encounter: Payer: Self-pay | Admitting: Family Medicine

## 2019-05-09 DIAGNOSIS — R202 Paresthesia of skin: Secondary | ICD-10-CM

## 2019-05-10 ENCOUNTER — Other Ambulatory Visit: Payer: Self-pay | Admitting: *Deleted

## 2019-05-10 ENCOUNTER — Ambulatory Visit (INDEPENDENT_AMBULATORY_CARE_PROVIDER_SITE_OTHER): Payer: BC Managed Care – PPO | Admitting: Gastroenterology

## 2019-05-10 ENCOUNTER — Encounter: Payer: Self-pay | Admitting: Gastroenterology

## 2019-05-10 ENCOUNTER — Other Ambulatory Visit: Payer: Self-pay

## 2019-05-10 VITALS — BP 115/65 | HR 71 | Temp 97.9°F | Ht 70.5 in | Wt 283.0 lb

## 2019-05-10 DIAGNOSIS — Z791 Long term (current) use of non-steroidal anti-inflammatories (NSAID): Secondary | ICD-10-CM | POA: Diagnosis not present

## 2019-05-10 DIAGNOSIS — K625 Hemorrhage of anus and rectum: Secondary | ICD-10-CM | POA: Diagnosis not present

## 2019-05-10 MED ORDER — TAMSULOSIN HCL 0.4 MG PO CAPS: 0.4000 mg | ORAL_CAPSULE | Freq: Every day | ORAL | 3 refills | Status: DC

## 2019-05-10 NOTE — Progress Notes (Signed)
[name] MD, MRCP(U.K) [address]  [place]  [place], [place] [address]  Main: [phone]  Fax: [phone]   Primary Care Physician: Valerie Roys, DO  Primary Gastroenterologist:  Dr. [name]   Chief Complaint  Patient presents with  . Follow-up    Rectal Bleeding    HPI: Curtis Plucker. is a 58 y.o. male    Summary of history :  He was initially referred and seen in October 2020 for rectal bleeding.  Had begun 10 days prior to his visit.  Dark stools and he is unsure if it is black.  He had a bowel movement 2-3 times a day not hard.  He had been on meloxicam aspirin 1 tablet daily for back pain in the last 1 year.  Interval history   03/23/2019-05/10/2019  04/29/2019: Hemoglobin 14.6 g 03/30/2019: EGD: 2 tongues of salmon color appearing mucosa seen at the lower end of the esophagus from the GE junction.  Biopsies were taken.  No evidence of Barrett's esophagus on biopsy.  Colonoscopy showed nonbleeding internal hemorrhoids. He says that he continues to have blood on the toilet paper every time he wipes.  Bright red a few drops not a large quantity.  Has not stopped taking his Mobic on a daily basis.  He states that his memory has been poor.  Denies any constipation.  Has been prescribed Anusol but has not taken it.  Current Outpatient Medications  Medication Sig Dispense Refill  . ASPIR-LOW 81 MG EC tablet     . buPROPion (WELLBUTRIN SR) 150 MG 12 hr tablet 1 tab in the AM for 1 week, then 1 tab BID 60 tablet 2  . CALCIUM PO Take 1 tablet by mouth 2 (two) times a day.    . Cholecalciferol (VITAMIN D-3) 125 MCG (5000 UT) TABS Take 5,000 Units by mouth at bedtime.    . Coenzyme Q10 (COQ10) 200 MG CAPS Take 200 mg by mouth at bedtime.    . gabapentin (NEURONTIN) 300 MG capsule Take 1 capsule (300 mg total) by mouth at bedtime. 90 capsule 1  . hydrocortisone (ANUSOL-HC) 25 MG suppository Place 1 suppository (25 mg total) rectally 2 (two) times daily. 12  suppository 3  . ibuprofen (ADVIL) 200 MG tablet Take 800 mg by mouth every 8 (eight) hours as needed (pain.).    Marland Kitchen pseudoephedrine (SUDAFED) 30 MG tablet Take 60 mg by mouth at bedtime.    . RABEprazole (ACIPHEX) 20 MG tablet Take 2 tablets (40 mg total) by mouth daily. 180 tablet 3  . simvastatin (ZOCOR) 40 MG tablet Take 1 tablet (40 mg total) by mouth at bedtime. 90 tablet 1  . tamsulosin (FLOMAX) 0.4 MG CAPS capsule Take 1 capsule (0.4 mg total) by mouth daily. 90 capsule 3  . traZODone (DESYREL) 50 MG tablet Take 2 tablets (100 mg total) by mouth at bedtime. 180 tablet 3  . vitamin B-12 (CYANOCOBALAMIN) 1000 MCG tablet Take 1,000 mcg by mouth at bedtime.     No current facility-administered medications for this visit.    Allergies as of 05/10/2019  . (No Known Allergies)    ROS:  General: Negative for anorexia, weight loss, fever, chills, fatigue, weakness. ENT: Negative for hoarseness, difficulty swallowing , nasal congestion. CV: Negative for chest pain, angina, palpitations, dyspnea on exertion, peripheral edema.  Respiratory: Negative for dyspnea at rest, dyspnea on exertion, cough, sputum, wheezing.  GI: See history of present illness. GU:  Negative for dysuria,  hematuria, urinary incontinence, urinary frequency, nocturnal urination.  Endo: Negative for unusual weight change.    Physical Examination:   BP 115/65   Pulse 71   Temp 97.9 F (36.6 C)   Ht 5' 10.5" (1.791 m)   Wt 283 lb (128.4 kg)   BMI 40.03 kg/m   General: Well-nourished, well-developed in no acute distress.  Eyes: No icterus. Conjunctivae pink. Neuro: Alert and oriented x 3.  Grossly intact. Skin: Warm and dry, no jaundice.   Psych: Alert and cooperative, normal mood and affect.   Imaging Studies: No results found.  Assessment and Plan:   Curtis Diane. is a 58 y.o. y/o male here to follow-up for rectal bleeding.  Hemoglobin has been normal.  EGD and colonoscopy in #2020 demonstrated only  internal hemorrhoids.  He had been on NSAIDs in the past.  I believe that his rectal bleeding is from his internal hemorrhoids.  We discussed conservative management including the use of fiber supplements which will provide him samples off, soaking his hemorrhoids and a warm bath every day at night.  Completing a 5-day course of Anusol suppositories.  If this does not help then the next up would be to perform banding of his hemorrhoids at the office.  I also strongly suggested that he stop using his meloxicam.     Dr [name]  MD,MRCP [facility]) Follow up in 2 weeks virtual visit

## 2019-05-23 ENCOUNTER — Telehealth: Payer: Self-pay | Admitting: Podiatry

## 2019-05-23 NOTE — Telephone Encounter (Signed)
Called pt and let him know orthotics are in and ok to pick up.Marland Kitchen

## 2019-05-23 NOTE — Telephone Encounter (Signed)
Pt called back in October wanting to know what he needs to do to get another pair of orthotics just like he has before the end of the year. States he has met his deductible and no one has called him back from the message he left in October.

## 2019-05-24 ENCOUNTER — Telehealth: Payer: Self-pay | Admitting: Family Medicine

## 2019-05-24 DIAGNOSIS — M722 Plantar fascial fibromatosis: Secondary | ICD-10-CM

## 2019-05-24 NOTE — Telephone Encounter (Signed)
Just started on this medicine. Will need a follow up to see if he does well on it before we give him a 90 day supply

## 2019-05-24 NOTE — Telephone Encounter (Signed)
Copied from [place] [phone]. Topic: General - Other >> May 24, 2019 12:02 PM Wynetta Emery, [place] C wrote: Reason for CRM: pt called in to request a 90 day supply of buPROPion (WELLBUTRIN SR) 150 MG 12 hr tablet, pt says that he only received a 30 day.   Please assist.

## 2019-05-24 NOTE — Telephone Encounter (Unsigned)
Copied from [place] [phone]. Topic: General - Other >> May 24, 2019 12:02 PM Wynetta Emery, [place] C wrote: Reason for CRM: pt called in to request a 90 day supply of buPROPion (WELLBUTRIN SR) 150 MG 12 hr tablet, pt says that he only received a 30 day.   Please assist.

## 2019-05-24 NOTE — Telephone Encounter (Signed)
Patient notified.  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

## 2019-05-31 ENCOUNTER — Other Ambulatory Visit: Payer: Self-pay

## 2019-05-31 ENCOUNTER — Ambulatory Visit (INDEPENDENT_AMBULATORY_CARE_PROVIDER_SITE_OTHER): Payer: BC Managed Care – PPO | Admitting: Gastroenterology

## 2019-05-31 DIAGNOSIS — K625 Hemorrhage of anus and rectum: Secondary | ICD-10-CM

## 2019-05-31 NOTE — Progress Notes (Signed)
[name] , MD [address]  [place], [place] [address]  Main: [phone]  Fax: [phone]   Primary Care Physician: Valerie Roys, DO  Virtual Visit via Video Note  I connected with patient on 05/31/19 at  1:00 PM EST by video and verified that I am speaking with the correct person using two identifiers.   I discussed the limitations, risks, security and privacy concerns of performing an evaluation and management service by video  and the availability of in person appointments. I also discussed with the patient that there may be a patient responsible charge related to this service. The patient expressed understanding and agreed to proceed.  Location of Patient: Home Location of Provider: Home Persons involved: Patient and provider only   History of Present Illness:  Rectal bleeding follow up   HPI: Curtis Bray. is a 59 y.o. male   Summary of history :  He was initially referred and seen in October 2020 for rectal bleeding.  Had begun 10 days prior to his visit.  Dark stools and he is unsure if it is black.  He had a bowel movement 2-3 times a day not hard.  He had been on meloxicam aspirin 1 tablet daily for back pain in the last 1 year. 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  Biopsies were taken.  No evidence of Barrett's esophagus on biopsy.  Colonoscopy showed nonbleeding internal hemorrhoids.   Interval history  05/10/2019-05/31/2019 Could not perform a video visit due to poor audio quality and had to convert to a telephone visit.  He is doing much better since his last office visit.  Minimal rectal bleeding.  The Anusol has helped.  Not using any NSAIDs.  Discussed yet again high-fiber diet stool softeners.   Current Outpatient Medications  Medication Sig Dispense Refill  . ASPIR-LOW 81 MG EC tablet     . buPROPion  (WELLBUTRIN SR) 150 MG 12 hr tablet 1 tab in the AM for 1 week, then 1 tab BID 60 tablet 2  . CALCIUM PO Take 1 tablet by mouth 2 (two) times a day.    . Cholecalciferol (VITAMIN D-3) 125 MCG (5000 UT) TABS Take 5,000 Units by mouth at bedtime.    . Coenzyme Q10 (COQ10) 200 MG CAPS Take 200 mg by mouth at bedtime.    . gabapentin (NEURONTIN) 300 MG capsule Take 1 capsule (300 mg total) by mouth at bedtime. 90 capsule 1  . hydrocortisone (ANUSOL-HC) 25 MG suppository Place 1 suppository (25 mg total) rectally 2 (two) times daily. 12 suppository 3  . ibuprofen (ADVIL) 200 MG tablet Take 800 mg by mouth every 8 (eight) hours as needed (pain.).    Marland Kitchen pseudoephedrine (SUDAFED) 30 MG tablet Take 60 mg by mouth at bedtime.    . RABEprazole (ACIPHEX) 20 MG tablet Take 2 tablets (40 mg total) by mouth daily. 180 tablet 3  . simvastatin (ZOCOR) 40 MG tablet Take 1 tablet (40 mg total) by mouth at bedtime. 90 tablet 1  . tamsulosin (FLOMAX) 0.4 MG CAPS capsule Take 1 capsule (0.4 mg total) by mouth daily. 90 capsule 3  . traZODone (DESYREL) 50 MG tablet Take 2 tablets (100 mg total) by mouth at bedtime. 180 tablet 3  . vitamin B-12 (CYANOCOBALAMIN) 1000 MCG tablet Take 1,000 mcg by mouth at bedtime.  No current facility-administered medications for this visit.    Allergies as of 05/31/2019  . (No Known Allergies)    Review of Systems:    All systems reviewed and negative except where noted in HPI.  General Appearance:    Alert, cooperative, no distress, appears stated age  Head:    Normocephalic, without obvious abnormality, atraumatic  Eyes:    PERRL, conjunctiva/corneas clear,  Ears:    Grossly normal hearing    Neurologic:  Grossly normal    Observations/Objective:  Labs: CMP     Component Value Date/Time   NA 141 04/29/2019 1445   K 4.5 04/29/2019 1445   CL 104 04/29/2019 1445   CO2 24 04/29/2019 1445   GLUCOSE 105 (H) 04/29/2019 1445   BUN 15 04/29/2019 1445   CREATININE 1.00  04/29/2019 1445   CALCIUM 9.8 04/29/2019 1445   PROT 6.3 04/29/2019 1445   ALBUMIN 4.4 04/29/2019 1445   AST 21 04/29/2019 1445   AST 26 09/13/2015 1549   ALT 30 04/29/2019 1445   ALT 38 09/13/2015 1549   ALKPHOS 97 04/29/2019 1445   BILITOT 0.4 04/29/2019 1445   GFRNONAA 83 04/29/2019 1445   GFRAA 95 04/29/2019 1445   Lab Results  Component Value Date   WBC 5.7 04/29/2019   HGB 14.6 04/29/2019   HCT 43.4 04/29/2019   MCV 89 04/29/2019   PLT 191 04/29/2019    Imaging Studies: No results found.  Assessment and Plan:   Curtis Bray. is a 59 y.o. y/o male here to follow-up for rectal bleeding.  Hemoglobin has been normal.  EGD and colonoscopy in #2020 demonstrated only internal hemorrhoids.  He had been on NSAIDs in the past.  I believe that his rectal bleeding is from his internal hemorrhoids.    Since his last visit he is doing much better minimal rectal bleeding which is occasional.  Completed a course of Anusol suppositories.  Reiterated the need to use a high-fiber diet and if needed stool softeners to ensure he does not have constipation.  If the problem persists then we may need to perform banding of the hemorrhoids in the office.  Briefly discussed with him the process of hemorrhoidal banding.  He will call back if the issue persists or worsens.  Follow-up as needed   I discussed the assessment and treatment plan with the patient. The patient was provided an opportunity to ask questions and all were answered. The patient agreed with the plan and demonstrated an understanding of the instructions.   The patient was advised to call back or seek an in-person evaluation if the symptoms worsen or if the condition fails to improve as anticipated.    Dr [name] MD,MRCP [facility]) Gastroenterology/Hepatology Pager: [phone]   Speech recognition software was used to dictate this note.

## 2019-06-02 ENCOUNTER — Other Ambulatory Visit: Payer: Self-pay

## 2019-06-02 ENCOUNTER — Encounter: Payer: Self-pay | Admitting: Family Medicine

## 2019-06-02 ENCOUNTER — Ambulatory Visit (INDEPENDENT_AMBULATORY_CARE_PROVIDER_SITE_OTHER): Payer: BC Managed Care – PPO | Admitting: Family Medicine

## 2019-06-02 DIAGNOSIS — M4726 Other spondylosis with radiculopathy, lumbar region: Secondary | ICD-10-CM | POA: Diagnosis not present

## 2019-06-02 DIAGNOSIS — F4323 Adjustment disorder with mixed anxiety and depressed mood: Secondary | ICD-10-CM | POA: Diagnosis not present

## 2019-06-02 DIAGNOSIS — C61 Malignant neoplasm of prostate: Secondary | ICD-10-CM

## 2019-06-02 DIAGNOSIS — N39490 Overflow incontinence: Secondary | ICD-10-CM

## 2019-06-02 MED ORDER — BUPROPION HCL ER (XL) 150 MG PO TB24: 150.0000 mg | ORAL_TABLET | Freq: Every day | ORAL | 1 refills | Status: DC

## 2019-06-02 MED ORDER — TAMSULOSIN HCL 0.4 MG PO CAPS: 0.8000 mg | ORAL_CAPSULE | Freq: Every day | ORAL | 3 refills | Status: DC

## 2019-06-02 NOTE — Progress Notes (Signed)
There were no vitals taken for this visit.   Subjective:    Patient ID: Curtis Bray., male    DOB: 08/20/1960, 59 y.o.   MRN: [id number]  HPI: Curtis Bray. is a 59 y.o. male  Chief Complaint  Patient presents with  . Adjustment Disorder    4 week   DEPRESSION Mood status: better Satisfied with current treatment?: yes Symptom severity: moderate  Duration of current treatment : 1 month Side effects: no Medication compliance: excellent compliance Psychotherapy/counseling: no  Previous psychiatric medications: wellbutrin Depressed mood: yes Anxious mood: no Anhedonia: no Significant weight loss or gain: no Insomnia: no  Fatigue: yes Feelings of worthlessness or guilt: no Impaired concentration/indecisiveness: no Suicidal ideations: no Hopelessness: no Crying spells: yes Depression screen [facility] 2/9 06/02/2019 04/29/2019 04/26/2018 03/24/2017 03/18/2016  Decreased Interest 1 3 0 0 0  Down, Depressed, Hopeless 0 0 0 0 0  PHQ - 2 Score 1 3 0 0 0  Altered sleeping 1 2 0 - -  Tired, decreased energy 2 3 1  - -  Change in appetite 0 2 0 - -  Feeling bad or failure about yourself  0 0 0 - -  Trouble concentrating 0 2 0 - -  Moving slowly or fidgety/restless 1 2 0 - -  Suicidal thoughts 0 0 0 - -  PHQ-9 Score 5 14 1  - -  Difficult doing work/chores Somewhat difficult Somewhat difficult Not difficult at all - -   Low back pain- seems to be getting worse.   Urinary frequency and dribbling. Following with urology.  Relevant past medical, surgical, family and social history reviewed and updated as indicated. Interim medical history since our last visit reviewed. Allergies and medications reviewed and updated.  Review of Systems  Constitutional: Negative.   Respiratory: Negative.   Cardiovascular: Negative.   Musculoskeletal: Positive for back pain and myalgias. Negative for arthralgias, gait problem, joint swelling, neck pain and neck stiffness.  Skin: Negative.     Neurological: Negative.   Psychiatric/Behavioral: Positive for dysphoric mood. Negative for agitation, behavioral problems, confusion, decreased concentration, hallucinations, self-injury, sleep disturbance and suicidal ideas. The patient is not nervous/anxious and is not hyperactive.     Per HPI unless specifically indicated above     Objective:    There were no vitals taken for this visit.  Wt Readings from Last 3 Encounters:  05/10/19 283 lb (128.4 kg)  04/29/19 282 lb 6 oz (128.1 kg)  04/27/19 (P) 290 lb 12.8 oz (131.9 kg)    Physical Exam Vitals and nursing note reviewed.  Constitutional:      General: He is not in acute distress.    Appearance: Normal appearance. He is not ill-appearing, toxic-appearing or diaphoretic.  HENT:     Head: Normocephalic and atraumatic.     Right Ear: External ear normal.     Left Ear: External ear normal.     Nose: Nose normal.     Mouth/Throat:     Mouth: Mucous membranes are moist.     Pharynx: Oropharynx is clear.  Eyes:     General: No scleral icterus.       Right eye: No discharge.        Left eye: No discharge.     Conjunctiva/sclera: Conjunctivae normal.     Pupils: Pupils are equal, round, and reactive to light.  Pulmonary:     Effort: Pulmonary effort is normal. No respiratory distress.     Comments: Speaking in  full sentences Musculoskeletal:        General: Normal range of motion.     Cervical back: Normal range of motion.  Skin:    Coloration: Skin is not jaundiced or pale.     Findings: No bruising, erythema, lesion or rash.  Neurological:     Mental Status: He is alert and oriented to person, place, and time. Mental status is at baseline.  Psychiatric:        Mood and Affect: Mood normal.        Behavior: Behavior normal.        Thought Content: Thought content normal.        Judgment: Judgment normal.     Results for orders placed or performed in visit on 04/29/19  CBC with Differential OUT  Result Value Ref  Range   WBC 5.7 3.4 - 10.8 x10E3/uL   RBC 4.87 4.14 - 5.80 x10E6/uL   Hemoglobin 14.6 13.0 - 17.7 g/dL   Hematocrit 43.4 37.5 - 51.0 %   MCV 89 79 - 97 fL   MCH 30.0 26.6 - 33.0 pg   MCHC 33.6 31.5 - 35.7 g/dL   RDW 12.4 11.6 - 15.4 %   Platelets 191 150 - 450 x10E3/uL   Neutrophils 53 Not Estab. %   Lymphs 35 Not Estab. %   Monocytes 8 Not Estab. %   Eos 3 Not Estab. %   Basos 1 Not Estab. %   Neutrophils Absolute 3.0 1.4 - 7.0 x10E3/uL   Lymphocytes Absolute 2.0 0.7 - 3.1 x10E3/uL   Monocytes Absolute 0.5 0.1 - 0.9 x10E3/uL   EOS (ABSOLUTE) 0.2 0.0 - 0.4 x10E3/uL   Basophils Absolute 0.1 0.0 - 0.2 x10E3/uL   Immature Granulocytes 0 Not Estab. %   Immature Grans (Abs) 0.0 0.0 - 0.1 x10E3/uL  Comp Met (CMET)  Result Value Ref Range   Glucose 105 (H) 65 - 99 mg/dL   BUN 15 6 - 24 mg/dL   Creatinine, Ser 1.00 0.76 - 1.27 mg/dL   GFR calc non Af Amer 83 >59 mL/min/1.73   GFR calc Af Amer 95 >59 mL/min/1.73   BUN/Creatinine Ratio 15 9 - 20   Sodium 141 134 - 144 mmol/L   Potassium 4.5 3.5 - 5.2 mmol/L   Chloride 104 96 - 106 mmol/L   CO2 24 20 - 29 mmol/L   Calcium 9.8 8.7 - 10.2 mg/dL   Total Protein 6.3 6.0 - 8.5 g/dL   Albumin 4.4 3.8 - 4.9 g/dL   Globulin, Total 1.9 1.5 - 4.5 g/dL   Albumin/Globulin Ratio 2.3 (H) 1.2 - 2.2   Bilirubin Total 0.4 0.0 - 1.2 mg/dL   Alkaline Phosphatase 97 39 - 117 IU/L   AST 21 0 - 40 IU/L   ALT 30 0 - 44 IU/L  Lipid Panel w/o Chol/HDL Ratio OUT  Result Value Ref Range   Cholesterol, Total 144 100 - 199 mg/dL   Triglycerides 277 (H) 0 - 149 mg/dL   HDL 38 (L) >39 mg/dL   VLDL Cholesterol Cal 44 (H) 5 - 40 mg/dL   LDL Chol Calc (NIH) 62 0 - 99 mg/dL  TSH  Result Value Ref Range   TSH 1.880 0.450 - 4.500 uIU/mL  UA/M w/rflx Culture, Routine   Specimen: Urine   URINE  Result Value Ref Range   Specific Gravity, UA >1.030 (H) 1.005 - 1.030   pH, UA 6.5 5.0 - 7.5   Color, UA Yellow Yellow  Appearance Ur Clear Clear   Leukocytes,UA  Negative Negative   Protein,UA Negative Negative/Trace   Glucose, UA Negative Negative   Ketones, UA Negative Negative   RBC, UA Negative Negative   Bilirubin, UA Negative Negative   Urobilinogen, Ur 0.2 0.2 - 1.0 mg/dL   Nitrite, UA Negative Negative      Assessment & Plan:   Problem List Items Addressed This Visit      Genitourinary   Malignant neoplasm of prostate ([place])    Will increase his flomax to 0.48m daily. Continue to follow with the urologist. Call with any concerns.        Other Visit Diagnoses    Adjustment disorder with mixed anxiety and depressed mood    -  Primary   Will change to XR and recheck 1 month. Call with any concerns.    Overflow incontinence of urine       Will increase her flomax to 0.843mand continue to follow with urology. Call with any concerns.    Relevant Medications   tamsulosin (FLOMAX) 0.4 MG CAPS capsule   Osteoarthritis of spine with radiculopathy, lumbar region       WIll follow back up with ortho. Call with any concerns.    Relevant Medications   gabapentin (NEURONTIN) 300 MG capsule   buPROPion (WELLBUTRIN XL) 150 MG 24 hr tablet       Follow up plan: Return in about 4 weeks (around 06/30/2019).   . This visit was completed via Doximity due to the restrictions of the COVID-19 pandemic. All issues as above were discussed and addressed. Physical exam was done as above through visual confirmation on Doximity. If it was felt that the patient should be evaluated in the office, they were directed there. The patient verbally consented to this visit. . Location of the patient: work . Location of the provider: work . Those involved with this call:  . Provider: MePark LiterDO . CMA: Tiffany Reel, CMA . Front Desk/Registration: ChDon Perking. Time spent on call: 25 minutes with patient face to face via video conference. More than 50% of this time was spent in counseling and coordination of care. 40 minutes total spent in review of  patient's record and preparation of their chart.

## 2019-06-06 ENCOUNTER — Encounter: Payer: Self-pay | Admitting: Family Medicine

## 2019-06-06 NOTE — Assessment & Plan Note (Signed)
Will increase his flomax to 0.8mg  daily. Continue to follow with the urologist. Call with any concerns.

## 2019-06-24 ENCOUNTER — Other Ambulatory Visit: Payer: Self-pay | Admitting: Family Medicine

## 2019-07-19 ENCOUNTER — Other Ambulatory Visit: Payer: Self-pay | Admitting: Family Medicine

## 2019-08-11 ENCOUNTER — Encounter: Payer: Self-pay | Admitting: Family Medicine

## 2019-10-05 ENCOUNTER — Other Ambulatory Visit: Payer: BC Managed Care – PPO

## 2019-10-05 ENCOUNTER — Other Ambulatory Visit: Payer: Self-pay

## 2019-10-05 DIAGNOSIS — C61 Malignant neoplasm of prostate: Secondary | ICD-10-CM

## 2019-10-06 LAB — PSA: Prostate Specific Ag, Serum: <0.1 ng/mL (ref 0.0–4.0)

## 2019-10-12 ENCOUNTER — Encounter: Payer: Self-pay | Admitting: Urology

## 2019-10-12 ENCOUNTER — Ambulatory Visit: Payer: BC Managed Care – PPO | Admitting: Urology

## 2019-10-12 ENCOUNTER — Other Ambulatory Visit: Payer: Self-pay

## 2019-10-12 VITALS — BP 126/77 | HR 65 | Ht 70.0 in | Wt 278.0 lb

## 2019-10-12 DIAGNOSIS — N3281 Overactive bladder: Secondary | ICD-10-CM | POA: Diagnosis not present

## 2019-10-12 DIAGNOSIS — C61 Malignant neoplasm of prostate: Secondary | ICD-10-CM | POA: Diagnosis not present

## 2019-10-12 DIAGNOSIS — N529 Male erectile dysfunction, unspecified: Secondary | ICD-10-CM | POA: Diagnosis not present

## 2019-10-12 MED ORDER — TADALAFIL 10 MG PO TABS: 10.0000 mg | ORAL_TABLET | Freq: Every day | ORAL | 3 refills | Status: DC | PRN

## 2019-10-12 NOTE — Progress Notes (Signed)
   10/12/2019 4:06 PM   Curtis Bray. 1961-05-23 [id number]  Reason for visit: Follow up high risk prostate cancer  HPI: I saw Curtis Bray in urology clinic for follow-up of prostate cancer today.  He is a 59 year old male who was found to have an elevated PSA of 6.7 in March 2020 and was found to have high volume high risk prostate cancer with no evidence of metastatic disease.  Staging imaging was negative.  He underwent his first Lupron injection in May 2020, and underwent brachytherapy on 12/13/2018.  Most recent PSA was undetectable at <0.1.  He reports mild to moderate urinary symptoms of weak stream and frequency and continues on Flomax.  He denies any UTIs or gross hematuria.  He has severe back pain secondary to degenerative disc disease at baseline, and has had trouble managing this lately.  He also has had moderate to severe depression that has been worsened on ADT.  He would like to defer further ADT at this time as he feels like his apathy and depression are the worst that they have ever been.  We discussed the risks and benefits at length, but I agree with him that he is more depressed than I have ever seen him, and I think he would benefit from a break from ADT.  He has had erectile dysfunction since starting ADT and is interested in trying medications for erections.  In summary, he is a 59 year old male with high risk high-volume prostate cancer and pretreatment PSA of 6.7 diagnosed in March 2020 who underwent 1 year total of ADT(discontinued today secondary to side effects) and brachytherapy seed implant in July 2020.  PSA remains undetectable.    Trial of Cialis 10 mg on demand for ED RTC 3 months with PSA prior and PVR   I spent 30 total minutes on the day of the encounter including pre-visit review of the medical record, face-to-face time with the patient, and post visit ordering of labs/imaging/tests.  [place], [facility] [address] [place], [place] [address] [phone]

## 2019-10-12 NOTE — Patient Instructions (Signed)
Tadalafil tablets (Cialis) What is this medicine? TADALAFIL (tah DA la fil) is used to treat erection problems in men. It is also used for enlargement of the prostate gland in men, a condition called benign prostatic hyperplasia or BPH. This medicine improves urine flow and reduces BPH symptoms. This medicine can also treat both erection problems and BPH when they occur together. This medicine may be used for other purposes; ask your health care provider or pharmacist if you have questions. COMMON BRAND NAME(S): Adcirca, ALYQ, Cialis What should I tell my health care provider before I take this medicine? They need to know if you have any of these conditions:  bleeding disorders  eye or vision problems, including a rare inherited eye disease called retinitis pigmentosa  anatomical deformation of the penis, Peyronie's disease, or history of priapism (painful and prolonged erection)  heart disease, angina, a history of heart attack, irregular heart beats, or other heart problems  high or low blood pressure  history of blood diseases, like sickle cell anemia or leukemia  history of stomach bleeding  kidney disease  liver disease  stroke  an unusual or allergic reaction to tadalafil, other medicines, foods, dyes, or preservatives  pregnant or trying to get pregnant  breast-feeding How should I use this medicine? Take this medicine by mouth with a glass of water. Follow the directions on the prescription label. You may take this medicine with or without meals. When this medicine is used for erection problems, your doctor may prescribe it to be taken once daily or as needed. If you are taking the medicine as needed, you may be able to have sexual activity 30 minutes after taking it and for up to 36 hours after taking it. Whether you are taking the medicine as needed or once daily, you should not take more than one dose per day. If you are taking this medicine for symptoms of benign  prostatic hyperplasia (BPH) or to treat both BPH and an erection problem, take the dose once daily at about the same time each day. Do not take your medicine more often than directed. Talk to your pediatrician regarding the use of this medicine in children. Special care may be needed. Overdosage: If you think you have taken too much of this medicine contact a poison control center or emergency room at once. NOTE: This medicine is only for you. Do not share this medicine with others. What if I miss a dose? If you are taking this medicine as needed for erection problems, this does not apply. If you miss a dose while taking this medicine once daily for an erection problem, benign prostatic hyperplasia, or both, take it as soon as you remember, but do not take more than one dose per day. What may interact with this medicine? Do not take this medicine with any of the following medications:  nitrates like amyl nitrite, isosorbide dinitrate, isosorbide mononitrate, nitroglycerin  other medicines for erectile dysfunction like avanafil, sildenafil, vardenafil  other tadalafil products (Adcirca)  riociguat This medicine may also interact with the following medications:  certain drugs for high blood pressure  certain drugs for the treatment of HIV infection or AIDS  certain drugs used for fungal or yeast infections, like fluconazole, itraconazole, ketoconazole, and voriconazole  certain drugs used for seizures like carbamazepine, phenytoin, and phenobarbital  grapefruit juice  macrolide antibiotics like clarithromycin, erythromycin, troleandomycin  medicines for prostate problems  rifabutin, rifampin or rifapentine This list may not describe all possible interactions. Give your   health care provider a list of all the medicines, herbs, non-prescription drugs, or dietary supplements you use. Also tell them if you smoke, drink alcohol, or use illegal drugs. Some items may interact with your  medicine. What should I watch for while using this medicine? If you notice any changes in your vision while taking this drug, call your doctor or health care professional as soon as possible. Stop using this medicine and call your health care provider right away if you have a loss of sight in one or both eyes. Contact your doctor or health care professional right away if the erection lasts longer than 4 hours or if it becomes painful. This may be a sign of serious problem and must be treated right away to prevent permanent damage. If you experience symptoms of nausea, dizziness, chest pain or arm pain upon initiation of sexual activity after taking this medicine, you should refrain from further activity and call your doctor or health care professional as soon as possible. Do not drink alcohol to excess (examples, 5 glasses of wine or 5 shots of whiskey) when taking this medicine. When taken in excess, alcohol can increase your chances of getting a headache or getting dizzy, increasing your heart rate or lowering your blood pressure. Using this medicine does not protect you or your partner against HIV infection (the virus that causes AIDS) or other sexually transmitted diseases. What side effects may I notice from receiving this medicine? Side effects that you should report to your doctor or health care professional as soon as possible:  allergic reactions like skin rash, itching or hives, swelling of the face, lips, or tongue  breathing problems  changes in hearing  changes in vision  chest pain  fast, irregular heartbeat  prolonged or painful erection  seizures Side effects that usually do not require medical attention (report to your doctor or health care professional if they continue or are bothersome):  back pain  dizziness  flushing  headache  indigestion  muscle aches  nausea  stuffy or runny nose This list may not describe all possible side effects. Call your doctor  for medical advice about side effects. You may report side effects to FDA at 1-800-FDA-1088. Where should I keep my medicine? Keep out of the reach of children. Store at room temperature between 15 and 30 degrees C (59 and 86 degrees F). Throw away any unused medicine after the expiration date. NOTE: This sheet is a summary. It may not cover all possible information. If you have questions about this medicine, talk to your doctor, pharmacist, or health care provider.  2020 Elsevier/Gold Standard (2013-09-30 13:15:49)  

## 2019-10-26 ENCOUNTER — Other Ambulatory Visit: Payer: Self-pay

## 2019-10-26 ENCOUNTER — Encounter: Payer: Self-pay | Admitting: Radiation Oncology

## 2019-10-26 ENCOUNTER — Ambulatory Visit
Admission: RE | Admit: 2019-10-26 | Discharge: 2019-10-26 | Disposition: A | Discharge status: Home / Self Care | Payer: BC Managed Care – PPO | Source: Ambulatory Visit | Attending: Radiation Oncology | Admitting: Radiation Oncology

## 2019-10-26 DIAGNOSIS — Z923 Personal history of irradiation: Secondary | ICD-10-CM | POA: Insufficient documentation

## 2019-10-26 DIAGNOSIS — C61 Malignant neoplasm of prostate: Secondary | ICD-10-CM | POA: Insufficient documentation

## 2019-10-26 NOTE — Progress Notes (Signed)
Radiation Oncology Follow up Note  Name: Curtis Bray.   Date:   10/26/2019 MRN:  [id number] DOB: 04/21/1961    This 59 y.o. male presents to the clinic today for 41-month follow-up status post I-125 interstitial implant for Gleason 7 (3+4) adenocarcinoma the prostate presenting with a PSA of 6.7.  REFERRING PROVIDER: Valerie Roys, DO  HPI: Patient is a 59 year old male now out 10 months having completed IMRT radiation therapy for stage IIa adenocarcinoma the prostate seen today in routine follow-up he has multiple problems with pain he has multiple compressed disks his lower urinary tract symptoms are fine he has nocturia x2 no specific urgency or frequency.  Bowels are also fine.  Most recent PSA is less than 0.1.Marland Kitchen  He was recently taken off his androgen deprivation therapy based on the emotional consequences and side effects.  COMPLICATIONS OF TREATMENT: none  FOLLOW UP COMPLIANCE: keeps appointments   PHYSICAL EXAM:  BP (P) 124/79 (BP Location: Left Arm, Patient Position: Sitting)    Pulse (P) 69    Resp (P) 16    Wt (P) 279 lb 4.8 oz (126.7 kg)    BMI (P) 40.08 kg/m  Well-developed well-nourished patient in NAD. HEENT reveals PERLA, EOMI, discs not visualized.  Oral cavity is clear. No oral mucosal lesions are identified. Neck is clear without evidence of cervical or supraclavicular adenopathy. Lungs are clear to A&P. Cardiac examination is essentially unremarkable with regular rate and rhythm without murmur rub or thrill. Abdomen is benign with no organomegaly or masses noted. Motor sensory and DTR levels are equal and symmetric in the upper and lower extremities. Cranial nerves II through XII are grossly intact. Proprioception is intact. No peripheral adenopathy or edema is identified. No motor or sensory levels are noted. Crude visual fields are within normal range.  RADIOLOGY RESULTS: No current films to review  PLAN: Present time patient is under excellent biochemical control  of his prostate cancer about a 1 year follow-ups.  He continues follow-up care with urology.  Patient knows to call with any concerns.  I would like to take this opportunity to thank you for allowing me to participate in the care of your patient.Noreene Filbert, MD

## 2019-11-14 ENCOUNTER — Other Ambulatory Visit: Payer: Self-pay | Admitting: Family Medicine

## 2019-11-18 ENCOUNTER — Telehealth: Payer: Self-pay | Admitting: Family Medicine

## 2019-11-18 NOTE — Telephone Encounter (Signed)
Appt scheduled

## 2019-11-18 NOTE — Telephone Encounter (Signed)
Patient is calling to request an order for a MRI and Dr. Jesus Genera in [place], [place].  Patient is also needing a MRI before seeing Dr. Julaine Fusi [phone]. Patient is wanting a second opinion on back surgery. Cb- [phone]  Patient states that Dr. Wynetta Emery referred him the first doctor but would like a second opinion  Please advise when the referral has been placed.

## 2019-11-18 NOTE — Telephone Encounter (Signed)
Needs appointment for MRI order. I can get him the referral without seeing him, but I cannot order an MRI

## 2019-11-18 NOTE — Telephone Encounter (Signed)
Routing to provider to advise.  

## 2019-11-21 ENCOUNTER — Ambulatory Visit
Admission: RE | Admit: 2019-11-21 | Discharge: 2019-11-21 | Disposition: A | Discharge status: Home / Self Care | Payer: BC Managed Care – PPO | Attending: Family Medicine | Admitting: Family Medicine

## 2019-11-21 ENCOUNTER — Ambulatory Visit (INDEPENDENT_AMBULATORY_CARE_PROVIDER_SITE_OTHER): Payer: BC Managed Care – PPO | Admitting: Family Medicine

## 2019-11-21 ENCOUNTER — Ambulatory Visit
Admission: RE | Admit: 2019-11-21 | Discharge: 2019-11-21 | Disposition: A | Discharge status: Home / Self Care | Payer: BC Managed Care – PPO | Source: Ambulatory Visit | Attending: Family Medicine | Admitting: Family Medicine

## 2019-11-21 ENCOUNTER — Other Ambulatory Visit: Payer: Self-pay

## 2019-11-21 ENCOUNTER — Encounter: Payer: Self-pay | Admitting: Family Medicine

## 2019-11-21 VITALS — BP 137/78 | HR 68 | Temp 98.2°F | Wt 277.0 lb

## 2019-11-21 DIAGNOSIS — M546 Pain in thoracic spine: Secondary | ICD-10-CM

## 2019-11-21 DIAGNOSIS — M4802 Spinal stenosis, cervical region: Secondary | ICD-10-CM

## 2019-11-21 DIAGNOSIS — G8929 Other chronic pain: Secondary | ICD-10-CM | POA: Diagnosis present

## 2019-11-21 DIAGNOSIS — M5442 Lumbago with sciatica, left side: Secondary | ICD-10-CM | POA: Insufficient documentation

## 2019-11-21 DIAGNOSIS — M542 Cervicalgia: Secondary | ICD-10-CM | POA: Insufficient documentation

## 2019-11-21 DIAGNOSIS — M5030 Other cervical disc degeneration, unspecified cervical region: Secondary | ICD-10-CM | POA: Diagnosis not present

## 2019-11-21 DIAGNOSIS — M4726 Other spondylosis with radiculopathy, lumbar region: Secondary | ICD-10-CM

## 2019-11-21 NOTE — Assessment & Plan Note (Signed)
Worsening. Getting no better with PT. Feeling like he's getting worse. Will obtain x-rays. Will likely need MRI. Referral to spinal surgeon made today.

## 2019-11-21 NOTE — Progress Notes (Signed)
BP 137/78   Pulse 68   Temp 98.2 F (36.8 C) (Oral)   Wt 277 lb (125.6 kg)   SpO2 96%   BMI 39.75 kg/m    Subjective:    Patient ID: Curtis Ran., male    DOB: Jan 13, 1961, 59 y.o.   MRN: [id number]  HPI: Curtis Hollenbach. is a 59 y.o. male  Chief Complaint  Patient presents with  . Back Pain    for an MRI due to back pain   BACK PAIN- back pain has been getting worse. Has been doing PT but it only works while he's there. He wants to go see another back specialist, but needs an MRI to see him  Duration: 3 years Location: bilateral low back, neck bilaterally and thorax L>R Onset: sudden Severity: severe Quality: constant ache with occasional shooting, stabbing pain Frequency: constant  Radiation: L leg below the knee and R thumb Aggravating factors: sitting up straight Alleviating factors: PT and sitting Status: worse Treatments attempted: rest, ice, heat, APAP, ibuprofen, aleve, physical therapy and HEP  Relief with NSAIDs?: mild Nighttime pain:  no Paresthesias / decreased sensation:  yes- in his feet Bowel / bladder incontinence:  no Fevers:  no Dysuria / urinary frequency:  no  Relevant past medical, surgical, family and social history reviewed and updated as indicated. Interim medical history since our last visit reviewed. Allergies and medications reviewed and updated.  Review of Systems  Per HPI unless specifically indicated above     Objective:    BP 137/78   Pulse 68   Temp 98.2 F (36.8 C) (Oral)   Wt 277 lb (125.6 kg)   SpO2 96%   BMI 39.75 kg/m   Wt Readings from Last 3 Encounters:  11/21/19 277 lb (125.6 kg)  10/26/19 (P) 279 lb 4.8 oz (126.7 kg)  10/12/19 278 lb (126.1 kg)    Physical Exam Vitals and nursing note reviewed.  Constitutional:      General: He is not in acute distress.    Appearance: Normal appearance. He is not ill-appearing, toxic-appearing or diaphoretic.  HENT:     Head: Normocephalic and atraumatic.     Right  Ear: External ear normal.     Left Ear: External ear normal.     Nose: Nose normal.     Mouth/Throat:     Mouth: Mucous membranes are moist.     Pharynx: Oropharynx is clear.  Eyes:     General: No scleral icterus.       Right eye: No discharge.        Left eye: No discharge.     Extraocular Movements: Extraocular movements intact.     Conjunctiva/sclera: Conjunctivae normal.     Pupils: Pupils are equal, round, and reactive to light.  Cardiovascular:     Rate and Rhythm: Normal rate and regular rhythm.     Pulses: Normal pulses.     Heart sounds: Normal heart sounds. No murmur heard.  No friction rub. No gallop.   Pulmonary:     Effort: Pulmonary effort is normal. No respiratory distress.     Breath sounds: Normal breath sounds. No stridor. No wheezing, rhonchi or rales.  Chest:     Chest wall: No tenderness.  Musculoskeletal:        General: Tenderness (L paraspinals in the thorax) present. No swelling, deformity or signs of injury. Normal range of motion.     Cervical back: Normal range of motion and neck supple.  Right lower leg: No edema.     Left lower leg: No edema.  Skin:    General: Skin is warm and dry.     Capillary Refill: Capillary refill takes less than 2 seconds.     Coloration: Skin is not jaundiced or pale.     Findings: No bruising, erythema, lesion or rash.  Neurological:     General: No focal deficit present.     Mental Status: He is alert and oriented to person, place, and time. Mental status is at baseline.     Cranial Nerves: No cranial nerve deficit.     Sensory: No sensory deficit.     Motor: No weakness.     Coordination: Coordination normal.     Gait: Gait normal.     Deep Tendon Reflexes: Reflexes normal.  Psychiatric:        Mood and Affect: Mood normal.        Behavior: Behavior normal.        Thought Content: Thought content normal.        Judgment: Judgment normal.     Results for orders placed or performed in visit on 10/05/19    PSA  Result Value Ref Range   Prostate Specific Ag, Serum <0.1 0.0 - 4.0 ng/mL      Assessment & Plan:   Problem List Items Addressed This Visit      Nervous and Auditory   Osteoarthritis of spine with radiculopathy, lumbar region    Worsening. Getting no better with PT. Feeling like he's getting worse. Will obtain x-rays. Will likely need MRI. Referral to spinal surgeon made today.      Relevant Orders   MR Lumbar Spine Wo Contrast     Musculoskeletal and Integument   DDD (degenerative disc disease), cervical    Worsening. Getting no better with PT. Feeling like he's getting worse. Will obtain x-rays. Will likely need MRI. Referral to spinal surgeon made today.      Relevant Orders   MR Cervical Spine Wo Contrast     Other   Neck pain - Primary   Relevant Orders   DG Cervical Spine Complete   Stenosis, cervical spine    Worsening. Getting no better with PT. Feeling like he's getting worse. Will obtain x-rays. Will likely need MRI. Referral to spinal surgeon made today.      Relevant Orders   MR Cervical Spine Wo Contrast    Other Visit Diagnoses    Chronic bilateral thoracic back pain       Worsening. Severe arthritis in neck and lumbar. Will obtain x-rays. Will likely need MRI as well. Referral to surgery made today.   Relevant Orders   DG Thoracic Spine W/Swimmers   Chronic bilateral low back pain with left-sided sciatica       Worsening. Getting no better with PT. Feeling like he's getting worse. Will obtain x-rays. Will likely need MRI. Referral to spinal surgeon made today.   Relevant Orders   DG Lumbar Spine Complete   MR Lumbar Spine Wo Contrast   Other cervical disc degeneration, unspecified cervical region       Relevant Orders   MR Cervical Spine Wo Contrast       Follow up plan: Return if symptoms worsen or fail to improve, for Physical.

## 2019-11-23 ENCOUNTER — Telehealth: Payer: Self-pay | Admitting: Family Medicine

## 2019-11-23 NOTE — Telephone Encounter (Signed)
Curtis Peat, Do you know if Dr. Wynetta Emery needs to be scheduled for a peer to peer or can she just call?

## 2019-11-23 NOTE — Telephone Encounter (Signed)
Routing to provider  

## 2019-11-23 NOTE — Telephone Encounter (Signed)
Called [employer] and they declined both imaging request. They are needing additional clinical Information and would like to do a peer to peer review.   [phone]  Please let me know if I can do anything. Thanks!

## 2019-11-23 NOTE — Telephone Encounter (Signed)
Let me know if I need to schedule peer to peer or if I just need to call

## 2019-11-23 NOTE — Telephone Encounter (Signed)
Dr Wynetta Emery can call. No Scheduling needed. Will only need  Pt ID- [id number]

## 2019-11-25 ENCOUNTER — Encounter: Payer: Self-pay | Admitting: Family Medicine

## 2019-11-25 ENCOUNTER — Ambulatory Visit (INDEPENDENT_AMBULATORY_CARE_PROVIDER_SITE_OTHER): Payer: BC Managed Care – PPO | Admitting: Family Medicine

## 2019-11-25 ENCOUNTER — Other Ambulatory Visit: Payer: Self-pay

## 2019-11-25 VITALS — BP 105/74 | HR 77 | Temp 99.2°F | Ht 69.92 in | Wt 278.5 lb

## 2019-11-25 DIAGNOSIS — K219 Gastro-esophageal reflux disease without esophagitis: Secondary | ICD-10-CM

## 2019-11-25 DIAGNOSIS — R52 Pain, unspecified: Secondary | ICD-10-CM

## 2019-11-25 DIAGNOSIS — Z0000 Encounter for general adult medical examination without abnormal findings: Secondary | ICD-10-CM

## 2019-11-25 DIAGNOSIS — R05 Cough: Secondary | ICD-10-CM

## 2019-11-25 DIAGNOSIS — W57XXXA Bitten or stung by nonvenomous insect and other nonvenomous arthropods, initial encounter: Secondary | ICD-10-CM

## 2019-11-25 DIAGNOSIS — Z6841 Body Mass Index (BMI) 40.0 and over, adult: Secondary | ICD-10-CM

## 2019-11-25 DIAGNOSIS — E782 Mixed hyperlipidemia: Secondary | ICD-10-CM

## 2019-11-25 DIAGNOSIS — R059 Cough, unspecified: Secondary | ICD-10-CM

## 2019-11-25 DIAGNOSIS — F321 Major depressive disorder, single episode, moderate: Secondary | ICD-10-CM

## 2019-11-25 MED ORDER — RABEPRAZOLE SODIUM 20 MG PO TBEC: 40.0000 mg | DELAYED_RELEASE_TABLET | Freq: Every day | ORAL | 3 refills | Status: DC

## 2019-11-25 MED ORDER — PREDNISONE 50 MG PO TABS: 50.0000 mg | ORAL_TABLET | Freq: Every day | ORAL | 0 refills | Status: DC

## 2019-11-25 MED ORDER — SIMVASTATIN 40 MG PO TABS: 40.0000 mg | ORAL_TABLET | Freq: Every day | ORAL | 1 refills | Status: DC

## 2019-11-25 MED ORDER — FLUTICASONE PROPIONATE 50 MCG/ACT NA SUSP
2.0000 | Freq: Every day | NASAL | 3 refills | Status: DC
Start: 2019-11-25 — End: 2021-09-06

## 2019-11-25 MED ORDER — BUPROPION HCL ER (XL) 300 MG PO TB24: 300.0000 mg | ORAL_TABLET | Freq: Every day | ORAL | 1 refills | Status: DC

## 2019-11-25 NOTE — Telephone Encounter (Signed)
Called the insurance company. Spoke with a representative and they are needing additional documentation on the MRI orders. Was directed to leave a voicemail for a nurse to call me back to let me know what kind of documentation is needed and where it needs to be sent it. The representative stated that it would be Tuesday probably before we heard a response.

## 2019-11-25 NOTE — Patient Instructions (Signed)

## 2019-11-25 NOTE — Assessment & Plan Note (Signed)
Encouraged diet and exercise with goal of losing 1-2lbs per week.  

## 2019-11-25 NOTE — Progress Notes (Signed)
BP 105/74   Pulse 77   Temp 99.2 F (37.3 C)   Ht 5' 9.92" (1.776 m)   Wt 278 lb 8 oz (126.3 kg)   SpO2 98%   BMI 40.05 kg/m    Subjective:    Patient ID: Curtis Ran., male    DOB: April 06, 1961, 59 y.o.   MRN: [id number]  HPI: Curtis Dombek. is a 59 y.o. male presenting on 11/25/2019 for comprehensive medical examination. Current medical complaints include:  Continues with pain in his neck and his back. Working on getting his MRIs approved and getting him into the neurosurgeon.   Has pulled several ticks off in the past couple of weeks. Has been having body aches. Would like to be checked for tick diseases.   HYPERLIPIDEMIA Hyperlipidemia status: stable Satisfied with current treatment?  yes Side effects:  no Medication compliance: excellent compliance Past cholesterol meds: simvastatin Supplements: none Aspirin:  yes The 10-year ASCVD risk score [name], et al., 2013) is: 4.6%   Values used to calculate the score:     Age: 77 years     Sex: Male     Is Non-Hispanic African American: No     Diabetic: No     Tobacco smoker: No     Systolic Blood Pressure: 939 mmHg     Is BP treated: No     HDL Cholesterol: 38 mg/dL     Total Cholesterol: 144 mg/dL Chest pain:  no Coronary artery disease:  no  GERD GERD control status: stable  Satisfied with current treatment? yes Medication side effects: no  Medication compliance: stable Dysphagia: no Odynophagia:  no Hematemesis: no Blood in stool: yes  DEPRESSION Mood status: exacerbated Satisfied with current treatment?: no Symptom severity: moderate  Duration of current treatment : months Side effects: no Medication compliance: excellent compliance Psychotherapy/counseling: no  Previous psychiatric medications: wellburtrin Depressed mood: yes Anxious mood: yes Anhedonia: no Significant weight loss or gain: no Insomnia: yes  Fatigue: yes Feelings of worthlessness or guilt: no Impaired  concentration/indecisiveness: no Suicidal ideations: no Hopelessness: no Crying spells: no Depression screen [facility] 2/9 11/25/2019 06/02/2019 04/29/2019 04/26/2018 03/24/2017  Decreased Interest 2 1 3  0 0  Down, Depressed, Hopeless 1 0 0 0 0  PHQ - 2 Score 3 1 3  0 0  Altered sleeping 0 1 2 0 -  Tired, decreased energy 3 2 3 1  -  Change in appetite 2 0 2 0 -  Feeling bad or failure about yourself  1 0 0 0 -  Trouble concentrating 1 0 2 0 -  Moving slowly or fidgety/restless 3 1 2  0 -  Suicidal thoughts 0 0 0 0 -  PHQ-9 Score 13 5 14 1  -  Difficult doing work/chores Very difficult Somewhat difficult Somewhat difficult Not difficult at all -     Depression Screen done today and results listed below:  Depression screen [facility] 2/9 11/25/2019 06/02/2019 04/29/2019 04/26/2018 03/24/2017  Decreased Interest 2 1 3  0 0  Down, Depressed, Hopeless 1 0 0 0 0  PHQ - 2 Score 3 1 3  0 0  Altered sleeping 0 1 2 0 -  Tired, decreased energy 3 2 3 1  -  Change in appetite 2 0 2 0 -  Feeling bad or failure about yourself  1 0 0 0 -  Trouble concentrating 1 0 2 0 -  Moving slowly or fidgety/restless 3 1 2  0 -  Suicidal thoughts 0 0 0 0 -  PHQ-9 Score 13 5 14 1  -  Difficult doing work/chores Very difficult Somewhat difficult Somewhat difficult Not difficult at all -    Past Medical History:  Past Medical History:  Diagnosis Date  . Allergy   . Cancer [facility])    Prostate  . Carpal tunnel syndrome   . DDD (degenerative disc disease), cervical   . DDD (degenerative disc disease), cervical   . Deafness in left ear   . Edema   . GERD (gastroesophageal reflux disease)   . Hyperlipidemia   . Personal history of colonic polyps   . Pinched nerve    upper and lower back  . Prostate cancer ([place])   . Sleep apnea    Bi-pap  . Sleep apnea   . TIA (transient ischemic attack) 07/2011  . Vision impairment    Blindness Right Eye    Surgical History:  Past Surgical History:  Procedure Laterality Date  .  APPENDECTOMY    . carpel tunnel Bilateral   . COLONOSCOPY  2015   Found 2 beign polyps, repeat 10 years  . COLONOSCOPY WITH PROPOFOL N/A 03/30/2019   Procedure: COLONOSCOPY WITH PROPOFOL;  Surgeon: [name], MD;  Location: [facility] ENDOSCOPY;  Service: Gastroenterology;  Laterality: N/A;  . ESOPHAGOGASTRODUODENOSCOPY (EGD) WITH PROPOFOL N/A 03/30/2019   Procedure: ESOPHAGOGASTRODUODENOSCOPY (EGD) WITH PROPOFOL;  Surgeon: [name], MD;  Location: [facility] ENDOSCOPY;  Service: Gastroenterology;  Laterality: N/A;  . RADIOACTIVE SEED IMPLANT N/A 12/13/2018   Procedure: RADIOACTIVE SEED IMPLANT/BRACHYTHERAPY IMPLANT;  Surgeon: Billey Co, MD;  Location: [facility] ORS;  Service: Urology;  Laterality: N/A;  . TONSILLECTOMY      Medications:  Current Outpatient Medications on File Prior to Visit  Medication Sig  . methocarbamol (ROBAXIN) 500 MG tablet TAKE 1/2 TO 1 TABLET BY MOUTH TWICE DAILY AS NEEDED  . tamsulosin (FLOMAX) 0.4 MG CAPS capsule Take 2 capsules (0.8 mg total) by mouth daily.  . traZODone (DESYREL) 50 MG tablet Take 2 tablets (100 mg total) by mouth at bedtime.  . ASPIR-LOW 81 MG EC tablet   . CALCIUM PO Take 1 tablet by mouth 2 (two) times a day.  . Cholecalciferol (VITAMIN D-3) 125 MCG (5000 UT) TABS Take 5,000 Units by mouth at bedtime.  . Coenzyme Q10 (COQ10) 200 MG CAPS Take 200 mg by mouth at bedtime.  . gabapentin (NEURONTIN) 300 MG capsule Take 1 capsule (300 mg total) by mouth 3 (three) times daily.  Marland Kitchen ibuprofen (ADVIL) 200 MG tablet Take 800 mg by mouth every 8 (eight) hours as needed (pain.).  Marland Kitchen tadalafil (CIALIS) 10 MG tablet Take 1 tablet (10 mg total) by mouth daily as needed for erectile dysfunction.  . vitamin B-12 (CYANOCOBALAMIN) 1000 MCG tablet Take 1,000 mcg by mouth at bedtime.   No current facility-administered medications on file prior to visit.    Allergies:  No Known Allergies  Social History:  Social History   Socioeconomic History  . Marital status:  Married    Spouse name: Not on file  . Number of children: Not on file  . Years of education: Not on file  . Highest education level: Not on file  Occupational History  . Not on file  Tobacco Use  . Smoking status: Former Smoker    Years: 18.00    Quit date: 05/27/1995    Years since quitting: 24.5  . Smokeless tobacco: Never Used  Vaping Use  . Vaping Use: Never used  Substance and Sexual Activity  . Alcohol use:  Yes    Alcohol/week: 0.0 standard drinks    Comment: occassionally   . Drug use: No  . Sexual activity: Yes  Other Topics Concern  . Not on file  Social History Narrative  . Not on file   Social Determinants of Health   Financial Resource Strain:   . Difficulty of Paying Living Expenses:   Food Insecurity:   . Worried About Charity fundraiser in the Last Year:   . Arboriculturist in the Last Year:   Transportation Needs:   . Film/video editor (Medical):   Marland Kitchen Lack of Transportation (Non-Medical):   Physical Activity:   . Days of Exercise per Week:   . Minutes of Exercise per Session:   Stress:   . Feeling of Stress :   Social Connections:   . Frequency of Communication with Friends and Family:   . Frequency of Social Gatherings with Friends and Family:   . Attends Religious Services:   . Active Member of Clubs or Organizations:   . Attends Archivist Meetings:   Marland Kitchen Marital Status:   Intimate Partner Violence:   . Fear of Current or Ex-Partner:   . Emotionally Abused:   Marland Kitchen Physically Abused:   . Sexually Abused:    Social History   Tobacco Use  Smoking Status Former Smoker  . Years: 18.00  . Quit date: 05/27/1995  . Years since quitting: 24.5  Smokeless Tobacco Never Used   Social History   Substance and Sexual Activity  Alcohol Use Yes  . Alcohol/week: 0.0 standard drinks   Comment: occassionally     Family History:  Family History  Problem Relation Age of Onset  . Hearing loss Father   . Hypertension Father   . Pneumonia  Father   . Asthma Son   . Cancer Maternal Grandmother        Colon    Past medical history, surgical history, medications, allergies, family history and social history reviewed with patient today and changes made to appropriate areas of the chart.   Review of Systems  Constitutional: Negative.   HENT: Positive for hearing loss. Negative for congestion, ear discharge, ear pain, nosebleeds, sinus pain, sore throat and tinnitus.   Eyes: Negative.   Respiratory: Positive for cough. Negative for hemoptysis, sputum production, shortness of breath, wheezing and stridor.   Cardiovascular: Positive for leg swelling. Negative for chest pain, palpitations, orthopnea, claudication and PND.  Gastrointestinal: Positive for blood in stool, constipation and heartburn. Negative for abdominal pain, diarrhea, melena, nausea and vomiting.  Genitourinary: Negative.   Musculoskeletal: Positive for back pain, joint pain, myalgias and neck pain. Negative for falls.  Skin: Positive for itching. Negative for rash.  Neurological: Positive for tingling and weakness. Negative for dizziness, tremors, sensory change, speech change, focal weakness, seizures, loss of consciousness and headaches.  Endo/Heme/Allergies: Positive for environmental allergies. Negative for polydipsia. Bruises/bleeds easily.  Psychiatric/Behavioral: Positive for depression. Negative for hallucinations, memory loss, substance abuse and suicidal ideas. The patient is nervous/anxious. The patient does not have insomnia.     All other ROS negative except what is listed above and in the HPI.      Objective:    BP 105/74   Pulse 77   Temp 99.2 F (37.3 C)   Ht 5' 9.92" (1.776 m)   Wt 278 lb 8 oz (126.3 kg)   SpO2 98%   BMI 40.05 kg/m   Wt Readings from Last 3 Encounters:  11/25/19 278  lb 8 oz (126.3 kg)  11/21/19 277 lb (125.6 kg)  10/26/19 (P) 279 lb 4.8 oz (126.7 kg)    Physical Exam Vitals and nursing note reviewed.    Constitutional:      General: He is not in acute distress.    Appearance: Normal appearance. He is obese. He is not ill-appearing, toxic-appearing or diaphoretic.  HENT:     Head: Normocephalic and atraumatic.     Right Ear: Tympanic membrane, ear canal and external ear normal. There is no impacted cerumen.     Left Ear: Tympanic membrane, ear canal and external ear normal. There is no impacted cerumen.     Nose: Nose normal. No congestion or rhinorrhea.     Mouth/Throat:     Mouth: Mucous membranes are moist.     Pharynx: Oropharynx is clear. No oropharyngeal exudate or posterior oropharyngeal erythema.  Eyes:     General: No scleral icterus.       Right eye: No discharge.        Left eye: No discharge.     Extraocular Movements: Extraocular movements intact.     Conjunctiva/sclera: Conjunctivae normal.     Pupils: Pupils are equal, round, and reactive to light.  Neck:     Vascular: No carotid bruit.  Cardiovascular:     Rate and Rhythm: Normal rate and regular rhythm.     Pulses: Normal pulses.     Heart sounds: No murmur heard.  No friction rub. No gallop.   Pulmonary:     Effort: Pulmonary effort is normal. No respiratory distress.     Breath sounds: Normal breath sounds. No stridor. No wheezing, rhonchi or rales.  Chest:     Chest wall: No tenderness.  Abdominal:     General: Abdomen is flat. Bowel sounds are normal. There is no distension.     Palpations: Abdomen is soft. There is no mass.     Tenderness: There is no abdominal tenderness. There is no right CVA tenderness, left CVA tenderness, guarding or rebound.     Hernia: No hernia is present.  Genitourinary:    Comments: Genital exam deferred with shared decision making Musculoskeletal:        General: No swelling, tenderness, deformity or signs of injury.     Cervical back: Normal range of motion and neck supple. No rigidity. No muscular tenderness.     Right lower leg: No edema.     Left lower leg: No edema.   Lymphadenopathy:     Cervical: No cervical adenopathy.  Skin:    General: Skin is warm and dry.     Capillary Refill: Capillary refill takes less than 2 seconds.     Coloration: Skin is not jaundiced or pale.     Findings: No bruising, erythema, lesion or rash.  Neurological:     General: No focal deficit present.     Mental Status: He is alert and oriented to person, place, and time.     Cranial Nerves: No cranial nerve deficit.     Sensory: No sensory deficit.     Motor: No weakness.     Coordination: Coordination normal.     Gait: Gait normal.     Deep Tendon Reflexes: Reflexes normal.  Psychiatric:        Mood and Affect: Mood normal.        Behavior: Behavior normal.        Thought Content: Thought content normal.        Judgment:  Judgment normal.     Results for orders placed or performed in visit on 10/05/19  PSA  Result Value Ref Range   Prostate Specific Ag, Serum <0.1 0.0 - 4.0 ng/mL      Assessment & Plan:   Problem List Items Addressed This Visit      Digestive   GERD (gastroesophageal reflux disease)    Under good control on current regimen. Continue current regimen. Continue to monitor. Call with any concerns. Refills given. Labs drawn today.       Relevant Medications   RABEprazole (ACIPHEX) 20 MG tablet   Other Relevant Orders   Comprehensive metabolic panel   CBC with Differential/Platelet   TSH     Other   Hyperlipidemia    Under good control on current regimen. Continue current regimen. Continue to monitor. Call with any concerns. Refills given. Labs drawn today.       Relevant Medications   simvastatin (ZOCOR) 40 MG tablet   Other Relevant Orders   Comprehensive metabolic panel   Lipid Panel w/o Chol/HDL Ratio   Morbid obesity with BMI of 40.0-44.9, adult (HCC)    Encouraged diet and exercise with goal of losing 1-2lbs per week.       Depression, major, single episode, moderate ([place])    Still not doing great. Will increase his  wellbutrin to 300mg  and recheck 1 month. Call with any concerns.       Relevant Medications   buPROPion (WELLBUTRIN XL) 300 MG 24 hr tablet    Other Visit Diagnoses    Routine general medical examination at a health care facility    -  Primary   Vaccines up to date. Screening labs checked today. Colonoscopy up to date. Continue diet and exercise. Call with any concerns.    Relevant Orders   Comprehensive metabolic panel   CBC with Differential/Platelet   Lipid Panel w/o Chol/HDL Ratio   TSH   Body aches       Will check for tick diseases. Await results. Treat as needed.    Relevant Orders   Lyme Ab/Western Blot Reflex   Rocky mtn spotted fvr abs pnl(IgG+IgM)   Babesia microti Antibody Panel   Ehrlichia Antibody Panel   Tick bite, initial encounter       Will check for tick diseases. Await results. Treat as needed.    Relevant Orders   Lyme Ab/Western Blot Reflex   Rocky mtn spotted fvr abs pnl(IgG+IgM)   Babesia microti Antibody Panel   Ehrlichia Antibody Panel   Cough       After URI. Will treat with burst of prednisone and flonase. Call if not getting better or getting worse.        LABORATORY TESTING:  Health maintenance labs ordered today as discussed above.   IMMUNIZATIONS:   - Tdap: Tetanus vaccination status reviewed: last tetanus booster within 10 years. - Influenza: Postponed to flu season - Pneumovax: Not applicable  SCREENING: - Colonoscopy: Up to date  Discussed with patient purpose of the colonoscopy is to detect colon cancer at curable precancerous or early stages   PATIENT COUNSELING:    Sexuality: Discussed sexually transmitted diseases, partner selection, use of condoms, avoidance of unintended pregnancy  and contraceptive alternatives.   Advised to avoid cigarette smoking.  I discussed with the patient that most people either abstain from alcohol or drink within safe limits (<=14/week and <=4 drinks/occasion for males, <=7/weeks and <= 3  drinks/occasion for females) and that the risk for alcohol  disorders and other health effects rises proportionally with the number of drinks per week and how often a drinker exceeds daily limits.  Discussed cessation/primary prevention of drug use and availability of treatment for abuse.   Diet: Encouraged to adjust caloric intake to maintain  or achieve ideal body weight, to reduce intake of dietary saturated fat and total fat, to limit sodium intake by avoiding high sodium foods and not adding table salt, and to maintain adequate dietary potassium and calcium preferably from fresh fruits, vegetables, and low-fat dairy products.    stressed the importance of regular exercise  Injury prevention: Discussed safety belts, safety helmets, smoke detector, smoking near bedding or upholstery.   Dental health: Discussed importance of regular tooth brushing, flossing, and dental visits.   Follow up plan: NEXT PREVENTATIVE PHYSICAL DUE IN 1 YEAR. Return in about 4 weeks (around 12/23/2019).

## 2019-11-25 NOTE — Assessment & Plan Note (Signed)
Still not doing great. Will increase his wellbutrin to 300mg  and recheck 1 month. Call with any concerns.

## 2019-11-25 NOTE — Assessment & Plan Note (Signed)
Under good control on current regimen. Continue current regimen. Continue to monitor. Call with any concerns. Refills given. Labs drawn today.   

## 2019-11-25 NOTE — Telephone Encounter (Signed)
Called to do Peer to Peer  His health plan closed the order. They state that they can do a provider courtesy review. I was transferred to leave a message and was sent to a place to enter an extension, which I was not given.   Please see if we can schedule the peer-to-peer/provider courtesy review. I can do it today after 4, or I'll have to do it after next week. Thanks. Will need to leave a message and they will call back to schedule.

## 2019-11-29 ENCOUNTER — Encounter: Payer: BC Managed Care – PPO | Admitting: Family Medicine

## 2019-11-29 NOTE — Telephone Encounter (Signed)
Insurance company called giving the additional info for documents lumbar and spine MRI. Insurance company is requesting also any progressive notes, patient labs and imaging done.  Insurance company requesting  PCP to write a note in own words for patient.    FAX [phone] Attention: Case appeals   Call back [phone]

## 2019-11-29 NOTE — Telephone Encounter (Signed)
Jessie: Please send the additional information. Dr. Wynetta Emery can you write a letter?

## 2019-11-29 NOTE — Telephone Encounter (Signed)
I have sent additional Information.

## 2019-11-30 ENCOUNTER — Other Ambulatory Visit: Payer: Self-pay | Admitting: Nurse Practitioner

## 2019-11-30 MED ORDER — DOXYCYCLINE HYCLATE 100 MG PO TABS
100.0000 mg | ORAL_TABLET | Freq: Two times a day (BID) | ORAL | 0 refills | Status: AC
Start: 2019-11-30 — End: 2019-12-07

## 2019-11-30 NOTE — Progress Notes (Signed)
Contacted via [place] evening Curtis Bray, some more of your tick bite labs have returned.  It appears when you saw Dr. Wynetta Emery in office you were having body aches and some of your [facility] Spotted Fever labs are returning positive -- I am going to send in Doxycycline for you to start taking and will let you know when remainder of labs return.  Dr. Wynetta Emery is on vacation this week, please let us know if any concerns and that you received this message.

## 2019-12-01 LAB — EHRLICHIA ANTIBODY PANEL
E. Chaffeensis (HME) IgM Titer: NEGATIVE
E.Chaffeensis (HME) IgG: NEGATIVE
HGE IgG Titer: NEGATIVE
HGE IgM Titer: NEGATIVE

## 2019-12-01 LAB — COMPREHENSIVE METABOLIC PANEL
ALT: 24 IU/L (ref 0–44)
AST: 16 IU/L (ref 0–40)
Albumin/Globulin Ratio: 2 (ref 1.2–2.2)
Albumin: 4.5 g/dL (ref 3.8–4.9)
Alkaline Phosphatase: 99 IU/L (ref 48–121)
BUN/Creatinine Ratio: 10 (ref 9–20)
BUN: 11 mg/dL (ref 6–24)
Bilirubin Total: 0.3 mg/dL (ref 0.0–1.2)
CO2: 25 mmol/L (ref 20–29)
Calcium: 9.5 mg/dL (ref 8.7–10.2)
Chloride: 103 mmol/L (ref 96–106)
Creatinine, Ser: 1.08 mg/dL (ref 0.76–1.27)
GFR calc Af Amer: 87 mL/min/{1.73_m2} (ref >59)
GFR calc non Af Amer: 75 mL/min/{1.73_m2} (ref >59)
Globulin, Total: 2.2 g/dL (ref 1.5–4.5)
Glucose: 129 mg/dL — ABNORMAL HIGH (ref 65–99)
Potassium: 4.3 mmol/L (ref 3.5–5.2)
Sodium: 141 mmol/L (ref 134–144)
Total Protein: 6.7 g/dL (ref 6.0–8.5)

## 2019-12-01 LAB — TSH: TSH: 1.77 u[IU]/mL (ref 0.450–4.500)

## 2019-12-01 LAB — ROCKY MTN SPOTTED FVR ABS PNL(IGG+IGM)
RMSF IgG: POSITIVE — AB
RMSF IgM: 0.73 index (ref 0.00–0.89)

## 2019-12-01 LAB — CBC WITH DIFFERENTIAL/PLATELET
Basophils Absolute: 0 10*3/uL (ref 0.0–0.2)
Basos: 1 %
EOS (ABSOLUTE): 0.2 10*3/uL (ref 0.0–0.4)
Eos: 3 %
Hematocrit: 41.3 % (ref 37.5–51.0)
Hemoglobin: 13.3 g/dL (ref 13.0–17.7)
Immature Grans (Abs): 0 10*3/uL (ref 0.0–0.1)
Immature Granulocytes: 0 %
Lymphocytes Absolute: 1.9 10*3/uL (ref 0.7–3.1)
Lymphs: 37 %
MCH: 28.5 pg (ref 26.6–33.0)
MCHC: 32.2 g/dL (ref 31.5–35.7)
MCV: 88 fL (ref 79–97)
Monocytes Absolute: 0.3 10*3/uL (ref 0.1–0.9)
Monocytes: 7 %
Neutrophils Absolute: 2.6 10*3/uL (ref 1.4–7.0)
Neutrophils: 52 %
Platelets: 207 10*3/uL (ref 150–450)
RBC: 4.67 x10E6/uL (ref 4.14–5.80)
RDW: 12.9 % (ref 11.6–15.4)
WBC: 5.1 10*3/uL (ref 3.4–10.8)

## 2019-12-01 LAB — RMSF, IGG, IFA: RMSF, IGG, IFA: 1:256 {titer} — ABNORMAL HIGH

## 2019-12-01 LAB — LIPID PANEL W/O CHOL/HDL RATIO
Cholesterol, Total: 143 mg/dL (ref 100–199)
HDL: 38 mg/dL — ABNORMAL LOW (ref >39)
LDL Chol Calc (NIH): 75 mg/dL (ref 0–99)
Triglycerides: 174 mg/dL — ABNORMAL HIGH (ref 0–149)
VLDL Cholesterol Cal: 30 mg/dL (ref 5–40)

## 2019-12-01 LAB — BABESIA MICROTI ANTIBODY PANEL
Babesia microti IgG: <1:10 {titer}
Babesia microti IgM: <1:10 {titer}

## 2019-12-01 LAB — LYME AB/WESTERN BLOT REFLEX
LYME DISEASE AB, QUANT, IGM: <0.8 index (ref 0.00–0.79)
Lyme IgG/IgM Ab: <0.91 {ISR} (ref 0.00–0.90)

## 2019-12-02 NOTE — Telephone Encounter (Signed)
What does that mean? I don't know what a "note in own words for patient" means

## 2019-12-06 NOTE — Telephone Encounter (Signed)
Reason for CRM: [employer] called about the status of an appeal made by Dr. Wynetta Emery for an MRI on the lumbar & cervical spine /     The outcome of the appeal and the determination was overturned and approved / the order number is [id number]/ the valid time frame is 7.13.21 for 30 days   Evelena Peat, can this be scheduled for the patient now?

## 2019-12-07 NOTE — Telephone Encounter (Signed)
Pt has been Scheduled. Thanks

## 2019-12-07 NOTE — Telephone Encounter (Signed)
Called pt to let him know about appointment. Pt states that he is also having hip pain and would like to know if this MRI will also show hip joints. Please advise

## 2019-12-12 NOTE — Telephone Encounter (Signed)
It will show some of the hips, but not all of them

## 2019-12-12 NOTE — Telephone Encounter (Signed)
Called and LVM asking for patient to please return my call.  

## 2019-12-13 NOTE — Telephone Encounter (Signed)
Patient returned my call and was notified of Dr. Durenda Age message. Patient states that he doesn't know if the pain is coming from his back or his actual hips. Patient wants to know if he should have x-ray of hips before the MRI or just wait to see what the MRI shows.

## 2019-12-13 NOTE — Telephone Encounter (Signed)
Patient notified

## 2019-12-13 NOTE — Telephone Encounter (Signed)
Let's get the MRI and if it doesn't show what's causing it, we'll work up the hips

## 2019-12-13 NOTE — Telephone Encounter (Signed)
Called and LVM asking for patient to please return my call.  

## 2019-12-21 ENCOUNTER — Ambulatory Visit: Payer: BC Managed Care – PPO

## 2019-12-21 ENCOUNTER — Other Ambulatory Visit: Payer: BC Managed Care – PPO

## 2019-12-26 ENCOUNTER — Telehealth: Payer: Self-pay | Admitting: Podiatry

## 2019-12-26 NOTE — Telephone Encounter (Signed)
Pt called and left message stating he stopped into [place] office to get orthotics and they told him to call me.  I called pt and he said he ordered the orthotics late last yr and never got to pick them up and the [place] office told him to call me to see if I had them.   I checked and do have them and pt asked if I could mail them to the patient as the [place] office is out of the way for him. I told pt I would put them in the mail tomorrow.

## 2019-12-27 ENCOUNTER — Encounter: Payer: Self-pay | Admitting: Family Medicine

## 2019-12-27 ENCOUNTER — Other Ambulatory Visit: Payer: Self-pay | Admitting: Nurse Practitioner

## 2019-12-27 DIAGNOSIS — M5030 Other cervical disc degeneration, unspecified cervical region: Secondary | ICD-10-CM

## 2019-12-27 DIAGNOSIS — M4802 Spinal stenosis, cervical region: Secondary | ICD-10-CM

## 2019-12-27 NOTE — Telephone Encounter (Signed)
Referral placed today to Dr. Vertell Limber in [place] - please make patient aware

## 2019-12-29 ENCOUNTER — Ambulatory Visit
Admission: RE | Admit: 2019-12-29 | Discharge: 2019-12-29 | Disposition: A | Discharge status: Home / Self Care | Payer: BC Managed Care – PPO | Source: Ambulatory Visit | Attending: Family Medicine | Admitting: Family Medicine

## 2019-12-29 ENCOUNTER — Other Ambulatory Visit: Payer: Self-pay

## 2019-12-29 DIAGNOSIS — M5030 Other cervical disc degeneration, unspecified cervical region: Secondary | ICD-10-CM | POA: Diagnosis not present

## 2019-12-29 DIAGNOSIS — M4726 Other spondylosis with radiculopathy, lumbar region: Secondary | ICD-10-CM | POA: Insufficient documentation

## 2019-12-29 DIAGNOSIS — M5442 Lumbago with sciatica, left side: Secondary | ICD-10-CM | POA: Diagnosis present

## 2019-12-29 DIAGNOSIS — G8929 Other chronic pain: Secondary | ICD-10-CM | POA: Insufficient documentation

## 2019-12-29 DIAGNOSIS — M4802 Spinal stenosis, cervical region: Secondary | ICD-10-CM

## 2020-01-05 ENCOUNTER — Telehealth (INDEPENDENT_AMBULATORY_CARE_PROVIDER_SITE_OTHER): Payer: BC Managed Care – PPO | Admitting: Family Medicine

## 2020-01-05 ENCOUNTER — Encounter: Payer: Self-pay | Admitting: Family Medicine

## 2020-01-05 ENCOUNTER — Ambulatory Visit
Admission: RE | Admit: 2020-01-05 | Discharge: 2020-01-05 | Disposition: A | Discharge status: Home / Self Care | Payer: BC Managed Care – PPO | Attending: Urology | Admitting: Urology

## 2020-01-05 DIAGNOSIS — M1990 Unspecified osteoarthritis, unspecified site: Secondary | ICD-10-CM | POA: Diagnosis not present

## 2020-01-05 DIAGNOSIS — C61 Malignant neoplasm of prostate: Secondary | ICD-10-CM | POA: Insufficient documentation

## 2020-01-05 DIAGNOSIS — F321 Major depressive disorder, single episode, moderate: Secondary | ICD-10-CM

## 2020-01-05 LAB — PSA: Prostatic Specific Antigen: 0.02 ng/mL (ref 0.00–4.00)

## 2020-01-05 NOTE — Progress Notes (Signed)
There were no vitals taken for this visit.   Subjective:    Patient ID: Curtis Ran., male    DOB: 02-02-1961, 59 y.o.   MRN: [id number]  HPI: Curtis Bray. is a 59 y.o. male  Chief Complaint  Patient presents with   Depression   Hips are not doing well. He is scheduled to see neurosurgery in a couple of months, but he continues with a lot of pain.   DEPRESSION Mood status: better Satisfied with current treatment?: yes Symptom severity: mild  Duration of current treatment : chronic Side effects: no Medication compliance: excellent compliance Psychotherapy/counseling: no  Depressed mood: yes Anxious mood: yes Anhedonia: no Significant weight loss or gain: no Insomnia: no  Fatigue: yes Feelings of worthlessness or guilt: no Impaired concentration/indecisiveness: no Suicidal ideations: no Hopelessness: no Crying spells: no Depression screen [facility] 2/9 01/05/2020 11/25/2019 06/02/2019 04/29/2019 04/26/2018  Decreased Interest 1 2 1 3  0  Down, Depressed, Hopeless 0 1 0 0 0  PHQ - 2 Score 1 3 1 3  0  Altered sleeping 0 0 1 2 0  Tired, decreased energy 1 3 2 3 1   Change in appetite 0 2 0 2 0  Feeling bad or failure about yourself  0 1 0 0 0  Trouble concentrating 1 1 0 2 0  Moving slowly or fidgety/restless 0 3 1 2  0  Suicidal thoughts 0 0 0 0 0  PHQ-9 Score 3 13 5 14 1   Difficult doing work/chores Somewhat difficult Very difficult Somewhat difficult Somewhat difficult Not difficult at all   Relevant past medical, surgical, family and social history reviewed and updated as indicated. Interim medical history since our last visit reviewed. Allergies and medications reviewed and updated.  Review of Systems  Constitutional: Negative.   Respiratory: Negative.   Cardiovascular: Negative.   Gastrointestinal: Negative.   Musculoskeletal: Negative.   Neurological: Negative.   Psychiatric/Behavioral: Negative.     Per HPI unless specifically indicated above       Objective:    There were no vitals taken for this visit.  Wt Readings from Last 3 Encounters:  11/25/19 278 lb 8 oz (126.3 kg)  11/21/19 277 lb (125.6 kg)  10/26/19 (P) 279 lb 4.8 oz (126.7 kg)    Physical Exam Vitals and nursing note reviewed.  Constitutional:      General: He is not in acute distress.    Appearance: Normal appearance. He is not ill-appearing, toxic-appearing or diaphoretic.  HENT:     Head: Normocephalic and atraumatic.     Right Ear: External ear normal.     Left Ear: External ear normal.     Nose: Nose normal.     Mouth/Throat:     Mouth: Mucous membranes are moist.     Pharynx: Oropharynx is clear.  Eyes:     General: No scleral icterus.       Right eye: No discharge.        Left eye: No discharge.     Conjunctiva/sclera: Conjunctivae normal.     Pupils: Pupils are equal, round, and reactive to light.  Pulmonary:     Effort: Pulmonary effort is normal. No respiratory distress.     Comments: Speaking in full sentences Musculoskeletal:        General: Normal range of motion.     Cervical back: Normal range of motion.  Skin:    Coloration: Skin is not jaundiced or pale.     Findings: No bruising,  erythema, lesion or rash.  Neurological:     Mental Status: He is alert and oriented to person, place, and time. Mental status is at baseline.  Psychiatric:        Mood and Affect: Mood normal.        Behavior: Behavior normal.        Thought Content: Thought content normal.        Judgment: Judgment normal.     Results for orders placed or performed during the hospital encounter of 01/05/20  PSA  Result Value Ref Range   Prostatic Specific Antigen 0.02 0.00 - 4.00 ng/mL      Assessment & Plan:   Problem List Items Addressed This Visit      Other   Depression, major, single episode, moderate ([place]) - Primary    Doing better. Will continue current regimen. Refills given today. Call with any concerns. Continue to monitor.        Other Visit  Diagnoses    Arthritis       In exacerbation. Has appointment to see neurosurgery. Continue NSAID. Has done PT. Offered referral to ortho- but will hold off.        Follow up plan: Return in about 6 months (around 07/07/2020).    This visit was completed via MyChart due to the restrictions of the COVID-19 pandemic. All issues as above were discussed and addressed. Physical exam was done as above through visual confirmation on MyChart. If it was felt that the patient should be evaluated in the office, they were directed there. The patient verbally consented to this visit.  Location of the patient: work  Location of the provider: work  Those involved with this call:   Provider: Park Liter, DO  CMA: Yvonna Alanis, [place] Desk/Registration: Don Perking   Time spent on call: 15 minutes with patient face to face via video conference. More than 50% of this time was spent in counseling and coordination of care. 23 minutes total spent in review of patient's record and preparation of their chart.

## 2020-01-09 ENCOUNTER — Encounter: Payer: Self-pay | Admitting: Family Medicine

## 2020-01-09 NOTE — Assessment & Plan Note (Signed)
Doing better. Will continue current regimen. Refills given today. Call with any concerns. Continue to monitor.

## 2020-01-10 ENCOUNTER — Other Ambulatory Visit: Payer: Self-pay

## 2020-01-10 ENCOUNTER — Encounter: Payer: Self-pay | Admitting: Urology

## 2020-01-10 ENCOUNTER — Ambulatory Visit (INDEPENDENT_AMBULATORY_CARE_PROVIDER_SITE_OTHER): Payer: BC Managed Care – PPO | Admitting: Urology

## 2020-01-10 VITALS — BP 134/75 | HR 76 | Ht 70.0 in | Wt 280.0 lb

## 2020-01-10 DIAGNOSIS — R972 Elevated prostate specific antigen [PSA]: Secondary | ICD-10-CM | POA: Diagnosis not present

## 2020-01-10 DIAGNOSIS — C61 Malignant neoplasm of prostate: Secondary | ICD-10-CM | POA: Diagnosis not present

## 2020-01-10 DIAGNOSIS — N529 Male erectile dysfunction, unspecified: Secondary | ICD-10-CM

## 2020-01-10 MED ORDER — TAMSULOSIN HCL 0.4 MG PO CAPS: 0.8000 mg | ORAL_CAPSULE | Freq: Every day | ORAL | 3 refills | Status: DC

## 2020-01-10 NOTE — Progress Notes (Signed)
   01/10/2020 4:15 PM   Curtis Bray. [birth year] [id number]  Reason for visit: Follow up prostate cancer, urinary symptoms, ED  HPI: I saw Curtis Bray back in urology clinic for follow-up of prostate cancer today.  He is a 59 year old male who was found to have an elevated PSA of 6.7 in March 2020 and was found to have high volume high risk prostate cancer with no evidence of metastatic disease.  Staging imaging was negative.  He underwent his first Lupron injection in May 2020, and underwent brachytherapy on 12/13/2018.  PSA has remained undetectable since treatment.  He opted to discontinue ADT after 1 year secondary to severe side effects of worsening depression and apathy.  He does feel that his mood has improved since stopping ADT.  PSA remains <0.1, and was most recently checked on 01/05/2020.  Regarding his urinary symptoms, they are relatively stable on Flomax.  His primary urinary complaint is occasional urgency and urge incontinence, especially if he has coffee or diet drinks.  He is not interested in trying an additional medication for urinary symptoms at this time, but if it worsens would consider an OAB medication.  Regarding his erectile dysfunction, we tried Cialis 10 mg on demand at our last visit, but he and his wife have been on different schedules and he has not been sexually active.  We again reviewed the need for close PSA monitoring with his history of high risk prostate cancer, as well as monitoring of his urinary symptoms and ED.  RTC 6 months with PSA prior Continue Flomax for urinary symptoms and Cialis for ED   I spent 34 total minutes on the day of the encounter including pre-visit review of the medical record, face-to-face time with the patient, and post visit ordering of labs/imaging/tests.  Billey Co, [facility] [address] [place], [place] [address] [phone]

## 2020-02-24 ENCOUNTER — Other Ambulatory Visit: Payer: Self-pay | Admitting: Neurosurgery

## 2020-02-26 ENCOUNTER — Other Ambulatory Visit: Payer: Self-pay | Admitting: Family Medicine

## 2020-03-20 ENCOUNTER — Other Ambulatory Visit: Payer: Self-pay | Admitting: Nurse Practitioner

## 2020-03-20 DIAGNOSIS — R739 Hyperglycemia, unspecified: Secondary | ICD-10-CM

## 2020-03-27 ENCOUNTER — Other Ambulatory Visit: Payer: Self-pay

## 2020-03-27 ENCOUNTER — Other Ambulatory Visit
Admission: RE | Admit: 2020-03-27 | Discharge: 2020-03-27 | Disposition: A | Discharge status: Home / Self Care | Payer: BC Managed Care – PPO | Source: Ambulatory Visit | Attending: Neurosurgery | Admitting: Neurosurgery

## 2020-03-27 ENCOUNTER — Encounter (HOSPITAL_COMMUNITY): Payer: Self-pay

## 2020-03-27 ENCOUNTER — Encounter (HOSPITAL_COMMUNITY)
Admission: RE | Admit: 2020-03-27 | Discharge: 2020-03-27 | Disposition: A | Discharge status: Home / Self Care | Payer: BC Managed Care – PPO | Source: Ambulatory Visit | Attending: Neurosurgery | Admitting: Neurosurgery

## 2020-03-27 DIAGNOSIS — Z01812 Encounter for preprocedural laboratory examination: Secondary | ICD-10-CM | POA: Insufficient documentation

## 2020-03-27 DIAGNOSIS — Z20822 Contact with and (suspected) exposure to covid-19: Secondary | ICD-10-CM | POA: Insufficient documentation

## 2020-03-27 HISTORY — DX: Cerebral infarction, unspecified: I63.9

## 2020-03-27 HISTORY — DX: Umbilical hernia without obstruction or gangrene: K42.9

## 2020-03-27 LAB — CBC
HCT: 42.4 % (ref 39.0–52.0)
Hemoglobin: 13.6 g/dL (ref 13.0–17.0)
MCH: 29.2 pg (ref 26.0–34.0)
MCHC: 32.1 g/dL (ref 30.0–36.0)
MCV: 91.2 fL (ref 80.0–100.0)
Platelets: 186 10*3/uL (ref 150–400)
RBC: 4.65 MIL/uL (ref 4.22–5.81)
RDW: 13.5 % (ref 11.5–15.5)
WBC: 6.4 10*3/uL (ref 4.0–10.5)
nRBC: 0 % (ref 0.0–0.2)

## 2020-03-27 LAB — SURGICAL PCR SCREEN
MRSA, PCR: NEGATIVE
Staphylococcus aureus: NEGATIVE

## 2020-03-27 LAB — TYPE AND SCREEN
ABO/RH(D): O NEG
Antibody Screen: NEGATIVE

## 2020-03-27 NOTE — Pre-Procedure Instructions (Signed)
[address], [place] [address] Phone: [phone] Fax: [phone]    Your procedure is scheduled on Fri., Nov. 5, 2021 from 7:30AM-10:26AM  Report to [facility] Entrance "A" at 5:30AM  Call this number if you have problems the morning of surgery:  [phone]   Remember:  Do not eat or drink after midnight on Nov. 4th    Take these medicines the morning of surgery with A SIP OF WATER: BuPROPion (WELLBUTRIN XL)     Gabapentin (NEURONTIN)   RABEprazole (ACIPHEX)  Tamsulosin (FLOMAX)  If Needed: Methocarbamol (ROBAXIN)  Follow your surgeon's instructions on when to stop Aspirin.  If no instructions were given by your surgeon then you will need to call the office to get those instructions.     As of today, STOP taking all Aspirin (unless instructed by your doctor) and Other Aspirin containing products, Vitamins, Fish oils, and Herbal medications. Also stop all NSAIDS i.e. Advil, Ibuprofen, Motrin, Aleve, Anaprox, Naproxen, BC, Goody Powders, and all Supplements.   No Smoking of any kind, Tobacco/Vaping, or Alcohol products 24 hours prior to your procedure. If you use a Cpap at night, you may bring all equipment for your overnight stay.   Special instructions:   [facility]- Preparing For Surgery  Before surgery, you can play an important role. Because skin is not sterile, your skin needs to be as free of germs as possible. You can reduce the number of germs on your skin by washing with CHG (chlorahexidine gluconate) Soap before surgery.  CHG is an antiseptic cleaner which kills germs and bonds with the skin to continue killing germs even after washing.    Please do not use if you have an allergy to CHG or antibacterial soaps. If your skin becomes reddened/irritated stop using the CHG.  Do not shave (including legs and underarms) for at least 48 hours prior to first CHG shower. It is OK to shave your face.  Please  follow these instructions carefully.   1. Shower the NIGHT BEFORE SURGERY and the MORNING OF SURGERY with CHG.   2. If you chose to wash your hair, wash your hair first as usual with your normal shampoo.  3. After you shampoo, rinse your hair and body thoroughly to remove the shampoo.  4. Use CHG as you would any other liquid soap. You can apply CHG directly to the skin and wash gently with a scrungie or a clean washcloth.   5. Apply the CHG Soap to your body ONLY FROM THE NECK DOWN.  Do not use on open wounds or open sores. Avoid contact with your eyes, ears, mouth and genitals (private parts). Wash Face and genitals (private parts)  with your normal soap.  6. Wash thoroughly, paying special attention to the area where your surgery will be performed.  7. Thoroughly rinse your body with warm water from the neck down.  8. DO NOT shower/wash with your normal soap after using and rinsing off the CHG Soap.  9. Pat yourself dry with a CLEAN TOWEL.  10. Wear CLEAN PAJAMAS to bed the night before surgery, wear comfortable clothes the morning of surgery  11. Place CLEAN SHEETS on your bed the night of your first shower and DO NOT SLEEP WITH PETS.   Day of Surgery:             Remember to brush your teeth WITH YOUR REGULAR TOOTHPASTE.  Do not wear  jewelry.  Do not wear lotions, powders, colognes, or deodorant.  Do not shave 48 hours prior to surgery.  Men may shave face and neck.  Do not bring valuables to the hospital.  [facility] is not responsible for any belongings or valuables.  Contacts, dentures or bridgework may not be worn into surgery.    For patients admitted to the hospital, discharge time will be determined by your treatment team.  Patients discharged the day of surgery will not be allowed to drive home, and someone age 68 and over needs to stay with them for 24 hours.  Please wear clean clothes to the hospital/surgery center.    Please read over the following fact  sheets that you were given.

## 2020-03-27 NOTE — H&P (Signed)
Patient ID:   [id number] Patient: Curtis Bray  Date of Birth: 1961-01-27 Visit Type: Office Visit   Date: 02/23/2020 01:30 PM Provider: Marchia Meiers. Vertell Limber MD   This 59 year old male presents for neck pain.  HISTORY OF PRESENT ILLNESS: 1.  neck pain  Curtis Bray is a 59 year old male who presents for neurosurgical evaluation for neck and low back pain.  The patient reports that his neck pain began about 3 and half to 4 years ago and radiated into right shoulder into his biceps down his forearm and into his thumb.  He reported that he received an injection in his neck which relieved his pain for about 3 years.  However, his pain has now returned.  He reports that while sitting his neck and arm pain is mild but constant.  While standing for a couple minutes or walking he gets intense sharp 8/10 neck pain that persists until changes in position.  He is able to lean forward to help alleviate some pain.  His low back pain is reported to radiate into his buttocks.  In the past that has radiated down into his knees and has had numbness in his bilateral lower extremities with sitting.  He reported he has had multiple steroid injections in his back with no change in his symptoms.  He reports that his low back pain is much milder than his cervical neck pain.  He stated that his back typically gives him pain while sitting and standing.   Medications:  Advil p.r.n. For headaches, gabapentin 600 mg (1 tablet in the morning, 1/2 tablets HS), Robaxin 250 mg b.i.d.  Past medical history:  Diagnosed with prostate cancer in late 2020 and underwent brachytherapy in early 2021, depression, history of chronic right shoulder pain  Past surgical history:  Bilateral carpal tunnel release in the late 1990s       PAST MEDICAL/SURGICAL HISTORY:   (Detailed)      Family History:  (Detailed)   Social History:  (Detailed) Tobacco use reviewed. Preferred language is [place].   Smoking status: Former  smoker.  SMOKING STATUS Type Smoking Status Usage Per Day Years Used Total Pack Years  Former smoker         MEDICATIONS: (added, continued or stopped this visit)   ALLERGIES: Ingredient Reaction Medication Name Comment NO KNOWN ALLERGIES    No known allergies. Reviewed, updated.    PHYSICAL EXAM:  Vitals Date Temp F BP Pulse Ht In Wt Lb BMI BSA Pain Score 02/23/2020  112/73 62 70.5 279 39.47  6/10   PHYSICAL EXAM Details General Level of Distress: no acute distress Overall Appearance: normal    Cardiovascular Cardiac: regular rate and rhythm without murmur  Right Left  Carotid Pulses: normal normal  Respiratory Lungs: clear to auscultation  Neurological Orientation: normal Recent and Remote Memory: normal Attention Span and Concentration:   normal Language: normal Fund of Knowledge: normal  Right Left Sensation: normal normal Upper Extremity Coordination: normal normal  Lower Extremity Coordination: normal normal  Musculoskeletal Gait and Station: normal  Right Left Upper Extremity Muscle Strength: normal normal Lower Extremity Muscle Strength: normal normal Upper Extremity Muscle Tone:  normal normal Lower Extremity Muscle Tone: normal normal   Motor Strength Upper and lower extremity motor strength was tested in the clinically pertinent muscles. Any abnormal findings will be noted below.   Right Left Deltoid: 4+/5  Biceps: 4+/5  Triceps: 4+/5    Deep Tendon Reflexes  Right Left Biceps: normal normal Triceps:  normal normal Brachioradialis: normal normal Patellar: increase increase Achilles: normal normal  Sensory Sensation was tested at L1 to S1. Any abnormal findings will be noted below.  Right Left C6: decreased  C7: decreased  S1: decreased decreased  Cranial Nerves II. Optic Nerve/Visual Fields: normal III. Oculomotor: normal IV. Trochlear: normal V. Trigeminal: normal VI.  Abducens: normal VII. Facial: normal VIII. Acoustic/Vestibular: normal IX. Glossopharyngeal: normal X. Vagus: normal XI. Spinal Accessory: normal XII. Hypoglossal: normal  Motor and other Tests Lhermittes: negative Rhomberg: negative Pronator drift: absent     Right Left Spurlings negative negative Hoffman's: present normal Clonus: normal normal Babinski: normal normal SLR: negative negative Patrick's Corky Sox): negative negative Toe Walk: normal normal Toe Lift: normal normal Heel Walk: normal normal Tinels Elbow: negative negative Tinels Wrist: negative negative SI Joint: nontender nontender Phalen: negative negative   Additional Findings:  Suprapatellar reflex 3+.  Decreased range of motion in his right shoulder with internal rotation, and positive shoulder impingement testing.   DIAGNOSTIC RESULTS:  Cervical MRI 12/29/2019, four view cervical radiographs.  He has degenerative and arthritic changes throughout his cervical spine.  There is severe degeneration at the C4-5, C5-6, and C6-7 levels causing significant cervical spinal stenosis and spinal cord compression.  Most severe at the C5-6 level.  C5-6 and C6-7 with significant osteophytes on the left causing foraminal stenosis. Right AP internal/external shoulder radiographs showed minimal AC joint arthritis.  No structural abnormalities were noted. Noncontrast lumbar MRI and four view lumbar radiographs had multilevel degenerative changes throughout his lumbar spine.  L5 on S1 listhesis.  Significant facet arthropathy at L4-5 and L5-S1.  L5-S1 with right-sided foraminal stenosis.    IMPRESSION:  The patient is having severe cervical neck pain with right upper extremity radiculopathy.  On exam he is myelopathic with a positive Hoffmann sign on the right and suprapatellar reflex 3+.  Right deltoid 4+/5, right biceps 4+/5, and right triceps 4+/5. Decreased sensation to pinprick on the right side in C6-C7 distribution.  Decreased range of motion in his right shoulder with internal rotation, and positive shoulder impingement testing.  The patient is imaging is most remarkable for significant degeneration and arthritic changes at the C4-5, C5-6, and C6-7 levels as causing significant cervical spinal stenosis and spinal cord compression.  His exam findings are consistent with imaging.  I do believe that he has some shoulder pain with possibly some impingement in his shoulder.  Conservative and surgical treatment options were discussed with the patient along with the risks and benefits of both. However, since the patient is myelopathic and has significant weakness on exam, I have counseled the patient to undergo decompression of his spinal cord.  This will consist of a C4-5, C5-6, and C6-7 anterior cervical decompression fusion.  PLAN: Proceed with C4-5, C5-6, and C6-7 ACDF on November 5th 2021. Risks and benefits were discussed in detail with the patient and he wishes to proceed with surgery.  He was fitted and issued a hard cervical collar while in the office today. Detailed patient education was performed.  His questions were answered. He will follow-up with [place] in the clinic 3 weeks after discharge from the hospital with AP/lateral radiographs.  Orders: Diagnostic Procedures: Assessment Procedure M48.02 Cervical Spine- AP/Lat M54.12 Shoulder - Right AP Int/Ext Miscellaneous: Assessment  M48.02 Aspen Collar Regular [phone])  Completed Orders (this encounter) Order Details Reason Side Interpretation Result Initial Treatment Date Region Shoulder - Right AP Int/Ext      02/23/2020   Assessment/Plan  # Detail  Type Description  1. 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     2. Assessment Cervicalgia (M54.2).     3. Assessment Spondylolisthesis of lumbar region (M43.16).     4. 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     5. Assessment Lumbago with sciatica, right side (M54.41).     6. Assessment Other chronic pain (G89.29).     7. 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     8. Assessment Lumbar foraminal stenosis (M48.061).     9. Assessment Cervical radiculopathy (M54.12).    10. Assessment Cervical stenosis of spinal canal (M48.02).  Plan Orders Aspen Collar Regular [phone]).                   Provider:  Marchia Meiers. Vertell Limber MD  02/25/2020 02:55 PM    Dictation edited by: Marchia Meiers. Vertell Limber    CC Providers: Erline Levine MD  [address] [place], [place] [address]               Electronically signed by Marchia Meiers. Vertell Limber MD on 02/25/2020 02:55 PM

## 2020-03-27 NOTE — Progress Notes (Signed)
PCP - Dr. Park Liter  Cardiologist - Denies  Chest x-ray - Denies  EKG - Denies  Stress Test - Denies  ECHO - Denies  Cardiac Cath - Denies  AICD-na PM-na LOOP-na  Sleep Study - Yes- Positive CPAP - Yes- will bring day of surgery  LABS- 03/27/20: CBC, T/S, PCR, COVID  ASA- LD- 10/28  ERAS- No  HA1C- Denies  Anesthesia- No  Pt denies having chest pain, sob, or fever at this time. All instructions explained to the pt, with a verbal understanding of the material. Pt agrees to go over the instructions while at home for a better understanding. Pt also instructed to self quarantine after being tested for COVID-19. The opportunity to ask questions was provided.   Coronavirus Screening  Have you experienced the following symptoms:  Cough yes/no: No Fever (>100.41F)  yes/no: No Runny nose yes/no: No Sore throat yes/no: No Difficulty breathing/shortness of breath  yes/no: No  Have you or a family member traveled in the last 14 days and where? yes/no: No   If the patient indicates "YES" to the above questions, their PAT will be rescheduled to limit the exposure to others and, the surgeon will be notified. THE PATIENT WILL NEED TO BE ASYMPTOMATIC FOR 14 DAYS.   If the patient is not experiencing any of these symptoms, the PAT nurse will instruct them to NOT bring anyone with them to their appointment since they may have these symptoms or traveled as well.   Please remind your patients and families that hospital visitation restrictions are in effect and the importance of the restrictions.

## 2020-03-28 LAB — SARS CORONAVIRUS 2 (TAT 6-24 HRS): SARS Coronavirus 2: NEGATIVE

## 2020-03-29 ENCOUNTER — Other Ambulatory Visit: Payer: Self-pay

## 2020-03-29 ENCOUNTER — Encounter: Payer: Self-pay | Admitting: Family Medicine

## 2020-03-29 ENCOUNTER — Ambulatory Visit: Payer: BC Managed Care – PPO | Admitting: Family Medicine

## 2020-03-29 VITALS — BP 116/77 | HR 68 | Temp 98.4°F | Ht 70.0 in | Wt 285.0 lb

## 2020-03-29 DIAGNOSIS — K429 Umbilical hernia without obstruction or gangrene: Secondary | ICD-10-CM

## 2020-03-29 MED ORDER — DEXTROSE 5 % IV SOLN
3.0000 g | INTRAVENOUS | Status: DC
  Filled 2020-03-29: qty 3000

## 2020-03-29 NOTE — Progress Notes (Signed)
BP 116/77 (BP Location: Left Arm)   Pulse 68   Temp 98.4 F (36.9 C) (Oral)   Ht 5\' 10"  (1.778 m)   Wt 285 lb (129.3 kg)   SpO2 99%   BMI 40.89 kg/m    Subjective:    Patient ID: Curtis Bray., male    DOB: 10-May-1961, 59 y.o.   MRN: [id number]  HPI: Curtis Bray. is a 59 y.o. male  Chief Complaint  Patient presents with  . Hernia    f/u- has gotten bigger, belly button is bulging, knot in belly button    HERNIA Duration: months Location:  belly button Painful: yes Discomfort: yes Bulge: yes Quality:  Aching, throbbing  Onset: gradual Severity: moderate Context: bigger and worse Aggravating factors: lifting  Relevant past medical, surgical, family and social history reviewed and updated as indicated. Interim medical history since our last visit reviewed. Allergies and medications reviewed and updated.  Review of Systems  Constitutional: Negative.   Respiratory: Negative.   Cardiovascular: Negative.   Gastrointestinal: Negative.   Musculoskeletal: Negative.   Neurological: Negative.   Psychiatric/Behavioral: Negative.     Per HPI unless specifically indicated above     Objective:    BP 116/77 (BP Location: Left Arm)   Pulse 68   Temp 98.4 F (36.9 C) (Oral)   Ht 5\' 10"  (1.778 m)   Wt 285 lb (129.3 kg)   SpO2 99%   BMI 40.89 kg/m   Wt Readings from Last 3 Encounters:  03/29/20 285 lb (129.3 kg)  03/27/20 282 lb (127.9 kg)  01/10/20 280 lb (127 kg)    Physical Exam Vitals and nursing note reviewed.  Constitutional:      General: He is not in acute distress.    Appearance: Normal appearance. He is not ill-appearing, toxic-appearing or diaphoretic.  HENT:     Head: Normocephalic and atraumatic.     Right Ear: External ear normal.     Left Ear: External ear normal.     Nose: Nose normal.     Mouth/Throat:     Mouth: Mucous membranes are moist.     Pharynx: Oropharynx is clear.  Eyes:     General: No scleral icterus.       Right  eye: No discharge.        Left eye: No discharge.     Extraocular Movements: Extraocular movements intact.     Conjunctiva/sclera: Conjunctivae normal.     Pupils: Pupils are equal, round, and reactive to light.  Cardiovascular:     Rate and Rhythm: Normal rate and regular rhythm.     Pulses: Normal pulses.     Heart sounds: Normal heart sounds. No murmur heard.  No friction rub. No gallop.   Pulmonary:     Effort: Pulmonary effort is normal. No respiratory distress.     Breath sounds: Normal breath sounds. No stridor. No wheezing, rhonchi or rales.  Chest:     Chest wall: No tenderness.  Abdominal:     Hernia: A hernia (umbilical, reducible, but painful) is present.  Musculoskeletal:        General: Normal range of motion.     Cervical back: Normal range of motion and neck supple.  Skin:    General: Skin is warm and dry.     Capillary Refill: Capillary refill takes less than 2 seconds.     Coloration: Skin is not jaundiced or pale.     Findings: No bruising, erythema, lesion  or rash.  Neurological:     General: No focal deficit present.     Mental Status: He is alert and oriented to person, place, and time. Mental status is at baseline.  Psychiatric:        Mood and Affect: Mood normal.        Behavior: Behavior normal.        Thought Content: Thought content normal.        Judgment: Judgment normal.     Results for orders placed or performed during the hospital encounter of 03/27/20  Surgical pcr screen   Specimen: Nasal Mucosa; Nasal Swab  Result Value Ref Range   MRSA, PCR NEGATIVE NEGATIVE   Staphylococcus aureus NEGATIVE NEGATIVE  CBC per protocol  Result Value Ref Range   WBC 6.4 4.0 - 10.5 K/uL   RBC 4.65 4.22 - 5.81 MIL/uL   Hemoglobin 13.6 13.0 - 17.0 g/dL   HCT 42.4 39 - 52 %   MCV 91.2 80.0 - 100.0 fL   MCH 29.2 26.0 - 34.0 pg   MCHC 32.1 30.0 - 36.0 g/dL   RDW 13.5 11.5 - 15.5 %   Platelets 186 150 - 400 K/uL   nRBC 0.0 0.0 - 0.2 %  Type and screen    Result Value Ref Range   ABO/RH(D) O NEG    Antibody Screen NEG    Sample Expiration 04/10/2020,2359    Extend sample reason      NO TRANSFUSIONS OR PREGNANCY IN THE PAST 3 MONTHS Performed at [facility], [address], [place], [place] [address]       Assessment & Plan:   Problem List Items Addressed This Visit      Other   Umbilical hernia without obstruction and without gangrene - Primary    Becoming painful. Still reducible. Will get him into surgery for evaluation. Referral generated today.      Relevant Orders   Ambulatory referral to General Surgery       Follow up plan: Return if symptoms worsen or fail to improve.

## 2020-03-29 NOTE — Assessment & Plan Note (Signed)
Becoming painful. Still reducible. Will get him into surgery for evaluation. Referral generated today.

## 2020-03-30 ENCOUNTER — Ambulatory Visit (HOSPITAL_COMMUNITY): Payer: BC Managed Care – PPO

## 2020-03-30 ENCOUNTER — Other Ambulatory Visit: Payer: Self-pay

## 2020-03-30 ENCOUNTER — Ambulatory Visit (HOSPITAL_COMMUNITY): Payer: BC Managed Care – PPO | Admitting: Anesthesiology

## 2020-03-30 ENCOUNTER — Encounter (HOSPITAL_COMMUNITY): Payer: Self-pay | Admitting: Neurosurgery

## 2020-03-30 ENCOUNTER — Observation Stay (HOSPITAL_COMMUNITY)
Admission: RE | Admit: 2020-03-30 | Discharge: 2020-03-31 | Disposition: A | Discharge status: Home / Self Care | Payer: BC Managed Care – PPO | Attending: Neurosurgery | Admitting: Neurosurgery

## 2020-03-30 ENCOUNTER — Encounter (HOSPITAL_COMMUNITY)
Admission: RE | Disposition: A | Discharge status: Home / Self Care | Payer: Self-pay | Source: Home / Self Care | Attending: Neurosurgery

## 2020-03-30 DIAGNOSIS — M50122 Cervical disc disorder at C5-C6 level with radiculopathy: Secondary | ICD-10-CM | POA: Insufficient documentation

## 2020-03-30 DIAGNOSIS — Z419 Encounter for procedure for purposes other than remedying health state, unspecified: Secondary | ICD-10-CM

## 2020-03-30 DIAGNOSIS — M4712 Other spondylosis with myelopathy, cervical region: Secondary | ICD-10-CM | POA: Diagnosis not present

## 2020-03-30 DIAGNOSIS — M50123 Cervical disc disorder at C6-C7 level with radiculopathy: Secondary | ICD-10-CM | POA: Insufficient documentation

## 2020-03-30 DIAGNOSIS — Z87891 Personal history of nicotine dependence: Secondary | ICD-10-CM | POA: Insufficient documentation

## 2020-03-30 DIAGNOSIS — Z8546 Personal history of malignant neoplasm of prostate: Secondary | ICD-10-CM | POA: Diagnosis not present

## 2020-03-30 DIAGNOSIS — M4316 Spondylolisthesis, lumbar region: Secondary | ICD-10-CM | POA: Insufficient documentation

## 2020-03-30 DIAGNOSIS — M4802 Spinal stenosis, cervical region: Principal | ICD-10-CM | POA: Diagnosis present

## 2020-03-30 DIAGNOSIS — M542 Cervicalgia: Secondary | ICD-10-CM | POA: Diagnosis present

## 2020-03-30 HISTORY — PX: ANTERIOR CERVICAL DECOMP/DISCECTOMY FUSION: SHX1161

## 2020-03-30 LAB — ABO/RH: ABO/RH(D): O NEG

## 2020-03-30 SURGERY — ANTERIOR CERVICAL DECOMPRESSION/DISCECTOMY FUSION 3 LEVELS
Anesthesia: General

## 2020-03-30 MED ORDER — HYDROCODONE-ACETAMINOPHEN 5-325 MG PO TABS: 1.0000 | ORAL_TABLET | ORAL | Status: DC | PRN

## 2020-03-30 MED ORDER — CHLORHEXIDINE GLUCONATE CLOTH 2 % EX PADS: 6.0000 | MEDICATED_PAD | Freq: Once | CUTANEOUS | Status: DC

## 2020-03-30 MED ORDER — PHENYLEPHRINE HCL-NACL 10-0.9 MG/250ML-% IV SOLN
INTRAVENOUS | Status: DC | PRN
  Administered 2020-03-30: 25 ug/min via INTRAVENOUS

## 2020-03-30 MED ORDER — MIDAZOLAM HCL 5 MG/5ML IJ SOLN
INTRAMUSCULAR | Status: DC | PRN
  Administered 2020-03-30: 2 mg via INTRAVENOUS

## 2020-03-30 MED ORDER — POLYETHYLENE GLYCOL 3350 17 G PO PACK: 17.0000 g | PACK | Freq: Every day | ORAL | Status: DC | PRN

## 2020-03-30 MED ORDER — SUGAMMADEX SODIUM 500 MG/5ML IV SOLN
INTRAVENOUS | Status: DC | PRN
  Administered 2020-03-30: 300 mg via INTRAVENOUS

## 2020-03-30 MED ORDER — LACTATED RINGERS IV SOLN: INTRAVENOUS | Status: DC | PRN

## 2020-03-30 MED ORDER — ONDANSETRON HCL 4 MG/2ML IJ SOLN: 4.0000 mg | Freq: Four times a day (QID) | INTRAMUSCULAR | Status: DC | PRN

## 2020-03-30 MED ORDER — ASPIRIN EC 81 MG PO TBEC
81.0000 mg | DELAYED_RELEASE_TABLET | Freq: Every day | ORAL | Status: DC
  Administered 2020-03-30 – 2020-03-31 (×2): 81 mg via ORAL
  Filled 2020-03-30 (×2): qty 1

## 2020-03-30 MED ORDER — FENTANYL CITRATE (PF) 250 MCG/5ML IJ SOLN
INTRAMUSCULAR | Status: AC
  Filled 2020-03-30: qty 5

## 2020-03-30 MED ORDER — COQ10 200 MG PO CAPS: 200.0000 mg | ORAL_CAPSULE | Freq: Every day | ORAL | Status: DC

## 2020-03-30 MED ORDER — DEXAMETHASONE SODIUM PHOSPHATE 4 MG/ML IJ SOLN
4.0000 mg | Freq: Three times a day (TID) | INTRAMUSCULAR | Status: AC
  Administered 2020-03-30 (×2): 4 mg via INTRAVENOUS
  Filled 2020-03-30 (×2): qty 1

## 2020-03-30 MED ORDER — ONDANSETRON HCL 4 MG/2ML IJ SOLN
INTRAMUSCULAR | Status: AC
  Filled 2020-03-30: qty 2

## 2020-03-30 MED ORDER — THROMBIN 5000 UNITS EX SOLR
OROMUCOSAL | Status: DC | PRN
  Administered 2020-03-30 (×2): 5 mL via TOPICAL

## 2020-03-30 MED ORDER — LIDOCAINE 2% (20 MG/ML) 5 ML SYRINGE
INTRAMUSCULAR | Status: DC | PRN
  Administered 2020-03-30: 40 mg via INTRAVENOUS

## 2020-03-30 MED ORDER — VITAMIN D3 25 MCG (1000 UNIT) PO TABS
5000.0000 [IU] | ORAL_TABLET | Freq: Every day | ORAL | Status: DC
  Administered 2020-03-30: 5000 [IU] via ORAL
  Filled 2020-03-30 (×3): qty 5

## 2020-03-30 MED ORDER — ORAL CARE MOUTH RINSE: 15.0000 mL | Freq: Once | OROMUCOSAL | Status: AC

## 2020-03-30 MED ORDER — ONDANSETRON HCL 4 MG/2ML IJ SOLN
4.0000 mg | Freq: Once | INTRAMUSCULAR | Status: AC | PRN
  Administered 2020-03-30: 4 mg via INTRAVENOUS

## 2020-03-30 MED ORDER — HYDROXYZINE HCL 50 MG/ML IM SOLN
50.0000 mg | Freq: Four times a day (QID) | INTRAMUSCULAR | Status: DC | PRN
  Administered 2020-03-30: 50 mg via INTRAMUSCULAR
  Filled 2020-03-30: qty 1

## 2020-03-30 MED ORDER — SIMVASTATIN 20 MG PO TABS
40.0000 mg | ORAL_TABLET | Freq: Every day | ORAL | Status: DC
  Administered 2020-03-30: 40 mg via ORAL
  Filled 2020-03-30: qty 2

## 2020-03-30 MED ORDER — LIDOCAINE 2% (20 MG/ML) 5 ML SYRINGE
INTRAMUSCULAR | Status: AC
  Filled 2020-03-30: qty 5

## 2020-03-30 MED ORDER — OXYCODONE HCL 5 MG/5ML PO SOLN: 5.0000 mg | Freq: Once | ORAL | Status: DC | PRN

## 2020-03-30 MED ORDER — ACETAMINOPHEN 325 MG PO TABS
650.0000 mg | ORAL_TABLET | ORAL | Status: DC | PRN
  Administered 2020-03-30 – 2020-03-31 (×3): 650 mg via ORAL
  Filled 2020-03-30 (×3): qty 2

## 2020-03-30 MED ORDER — OXYCODONE HCL 5 MG PO TABS
5.0000 mg | ORAL_TABLET | ORAL | Status: DC | PRN
  Administered 2020-03-30 – 2020-03-31 (×5): 10 mg via ORAL
  Filled 2020-03-30 (×5): qty 2

## 2020-03-30 MED ORDER — ACETAMINOPHEN 650 MG RE SUPP: 650.0000 mg | RECTAL | Status: DC | PRN

## 2020-03-30 MED ORDER — TAMSULOSIN HCL 0.4 MG PO CAPS
0.8000 mg | ORAL_CAPSULE | Freq: Every day | ORAL | Status: DC
  Administered 2020-03-30 – 2020-03-31 (×2): 0.8 mg via ORAL
  Filled 2020-03-30 (×2): qty 2

## 2020-03-30 MED ORDER — DEXAMETHASONE SODIUM PHOSPHATE 10 MG/ML IJ SOLN
INTRAMUSCULAR | Status: DC | PRN
  Administered 2020-03-30: 10 mg via INTRAVENOUS

## 2020-03-30 MED ORDER — SUCCINYLCHOLINE CHLORIDE 200 MG/10ML IV SOSY
PREFILLED_SYRINGE | INTRAVENOUS | Status: DC | PRN
  Administered 2020-03-30: 200 mg via INTRAVENOUS

## 2020-03-30 MED ORDER — ZOLPIDEM TARTRATE 5 MG PO TABS: 5.0000 mg | ORAL_TABLET | Freq: Every evening | ORAL | Status: DC | PRN

## 2020-03-30 MED ORDER — CEFAZOLIN SODIUM-DEXTROSE 2-4 GM/100ML-% IV SOLN
2.0000 g | Freq: Three times a day (TID) | INTRAVENOUS | Status: AC
  Administered 2020-03-30 (×2): 2 g via INTRAVENOUS
  Filled 2020-03-30 (×2): qty 100

## 2020-03-30 MED ORDER — PROPOFOL 10 MG/ML IV BOLUS
INTRAVENOUS | Status: DC | PRN
  Administered 2020-03-30: 200 mg via INTRAVENOUS

## 2020-03-30 MED ORDER — GABAPENTIN 600 MG PO TABS
600.0000 mg | ORAL_TABLET | Freq: Every day | ORAL | Status: DC
  Administered 2020-03-31: 600 mg via ORAL
  Filled 2020-03-30: qty 1

## 2020-03-30 MED ORDER — GABAPENTIN 300 MG PO CAPS
900.0000 mg | ORAL_CAPSULE | Freq: Every day | ORAL | Status: DC
  Administered 2020-03-30: 900 mg via ORAL
  Filled 2020-03-30: qty 3

## 2020-03-30 MED ORDER — ONDANSETRON HCL 4 MG PO TABS: 4.0000 mg | ORAL_TABLET | Freq: Four times a day (QID) | ORAL | Status: DC | PRN

## 2020-03-30 MED ORDER — CHLORHEXIDINE GLUCONATE 0.12 % MT SOLN
OROMUCOSAL | Status: AC
  Filled 2020-03-30: qty 15

## 2020-03-30 MED ORDER — CHLORHEXIDINE GLUCONATE 0.12 % MT SOLN
15.0000 mL | Freq: Once | OROMUCOSAL | Status: AC
  Administered 2020-03-30: 15 mL via OROMUCOSAL

## 2020-03-30 MED ORDER — METHOCARBAMOL 500 MG PO TABS
ORAL_TABLET | ORAL | Status: AC
  Administered 2020-03-30: 500 mg via ORAL
  Filled 2020-03-30: qty 1

## 2020-03-30 MED ORDER — SUGAMMADEX SODIUM 500 MG/5ML IV SOLN
INTRAVENOUS | Status: AC
  Filled 2020-03-30: qty 5

## 2020-03-30 MED ORDER — OXYCODONE HCL 5 MG PO TABS: 5.0000 mg | ORAL_TABLET | Freq: Once | ORAL | Status: DC | PRN

## 2020-03-30 MED ORDER — SODIUM CHLORIDE 0.9% FLUSH
3.0000 mL | Freq: Two times a day (BID) | INTRAVENOUS | Status: DC
  Administered 2020-03-30 (×2): 3 mL via INTRAVENOUS

## 2020-03-30 MED ORDER — PROPOFOL 10 MG/ML IV BOLUS
INTRAVENOUS | Status: AC
  Filled 2020-03-30: qty 20

## 2020-03-30 MED ORDER — BUPIVACAINE HCL (PF) 0.5 % IJ SOLN
INTRAMUSCULAR | Status: DC | PRN
  Administered 2020-03-30: 5 mL

## 2020-03-30 MED ORDER — DEXAMETHASONE SODIUM PHOSPHATE 10 MG/ML IJ SOLN
INTRAMUSCULAR | Status: AC
  Filled 2020-03-30: qty 1

## 2020-03-30 MED ORDER — PANTOPRAZOLE SODIUM 40 MG PO TBEC
40.0000 mg | DELAYED_RELEASE_TABLET | Freq: Every day | ORAL | Status: DC
  Administered 2020-03-30: 40 mg via ORAL
  Filled 2020-03-30: qty 1

## 2020-03-30 MED ORDER — THROMBIN 5000 UNITS EX SOLR
CUTANEOUS | Status: AC
  Filled 2020-03-30: qty 5000

## 2020-03-30 MED ORDER — FLEET ENEMA 7-19 GM/118ML RE ENEM: 1.0000 | ENEMA | Freq: Once | RECTAL | Status: DC | PRN

## 2020-03-30 MED ORDER — FENTANYL CITRATE (PF) 250 MCG/5ML IJ SOLN
INTRAMUSCULAR | Status: DC | PRN
  Administered 2020-03-30: 100 ug via INTRAVENOUS
  Administered 2020-03-30 (×8): 50 ug via INTRAVENOUS

## 2020-03-30 MED ORDER — ONDANSETRON HCL 4 MG/2ML IJ SOLN
INTRAMUSCULAR | Status: DC | PRN
  Administered 2020-03-30: 4 mg via INTRAVENOUS

## 2020-03-30 MED ORDER — OXYCODONE HCL 5 MG PO TABS
ORAL_TABLET | ORAL | Status: AC
  Administered 2020-03-30: 10 mg via ORAL
  Filled 2020-03-30: qty 10

## 2020-03-30 MED ORDER — BUPIVACAINE HCL (PF) 0.5 % IJ SOLN
INTRAMUSCULAR | Status: AC
  Filled 2020-03-30: qty 30

## 2020-03-30 MED ORDER — PANTOPRAZOLE SODIUM 40 MG IV SOLR: 40.0000 mg | Freq: Every day | INTRAVENOUS | Status: DC

## 2020-03-30 MED ORDER — ROCURONIUM BROMIDE 10 MG/ML (PF) SYRINGE
PREFILLED_SYRINGE | INTRAVENOUS | Status: AC
  Filled 2020-03-30: qty 10

## 2020-03-30 MED ORDER — METHOCARBAMOL 500 MG PO TABS
250.0000 mg | ORAL_TABLET | Freq: Two times a day (BID) | ORAL | Status: DC | PRN
  Filled 2020-03-30: qty 1

## 2020-03-30 MED ORDER — DEXTROSE 5 % IV SOLN
INTRAVENOUS | Status: DC | PRN
  Administered 2020-03-30: 3 g via INTRAVENOUS

## 2020-03-30 MED ORDER — TRAZODONE HCL 100 MG PO TABS
100.0000 mg | ORAL_TABLET | Freq: Every day | ORAL | Status: DC
  Administered 2020-03-30: 100 mg via ORAL
  Filled 2020-03-30 (×2): qty 1

## 2020-03-30 MED ORDER — PHENOL 1.4 % MT LIQD: 1.0000 | OROMUCOSAL | Status: DC | PRN

## 2020-03-30 MED ORDER — METHOCARBAMOL 1000 MG/10ML IJ SOLN
500.0000 mg | Freq: Four times a day (QID) | INTRAVENOUS | Status: DC | PRN
  Filled 2020-03-30: qty 5

## 2020-03-30 MED ORDER — DOCUSATE SODIUM 100 MG PO CAPS
100.0000 mg | ORAL_CAPSULE | Freq: Two times a day (BID) | ORAL | Status: DC
  Administered 2020-03-30 – 2020-03-31 (×3): 100 mg via ORAL
  Filled 2020-03-30 (×3): qty 1

## 2020-03-30 MED ORDER — SODIUM CHLORIDE 0.9% FLUSH: 3.0000 mL | INTRAVENOUS | Status: DC | PRN

## 2020-03-30 MED ORDER — BISACODYL 10 MG RE SUPP: 10.0000 mg | Freq: Every day | RECTAL | Status: DC | PRN

## 2020-03-30 MED ORDER — MENTHOL 3 MG MT LOZG: 1.0000 | LOZENGE | OROMUCOSAL | Status: DC | PRN

## 2020-03-30 MED ORDER — LIDOCAINE-EPINEPHRINE 1 %-1:100000 IJ SOLN
INTRAMUSCULAR | Status: AC
  Filled 2020-03-30: qty 1

## 2020-03-30 MED ORDER — ALUM & MAG HYDROXIDE-SIMETH 200-200-20 MG/5ML PO SUSP: 30.0000 mL | Freq: Four times a day (QID) | ORAL | Status: DC | PRN

## 2020-03-30 MED ORDER — KCL IN DEXTROSE-NACL 20-5-0.45 MEQ/L-%-% IV SOLN: INTRAVENOUS | Status: DC

## 2020-03-30 MED ORDER — PANTOPRAZOLE SODIUM 40 MG PO TBEC: 40.0000 mg | DELAYED_RELEASE_TABLET | Freq: Every day | ORAL | Status: DC

## 2020-03-30 MED ORDER — LIDOCAINE-EPINEPHRINE 1 %-1:100000 IJ SOLN
INTRAMUSCULAR | Status: DC | PRN
  Administered 2020-03-30: 5 mL

## 2020-03-30 MED ORDER — HYDROMORPHONE HCL 1 MG/ML IJ SOLN
0.5000 mg | INTRAMUSCULAR | Status: DC | PRN
  Administered 2020-03-30: 0.5 mg via INTRAVENOUS
  Filled 2020-03-30: qty 0.5

## 2020-03-30 MED ORDER — LACTATED RINGERS IV SOLN: INTRAVENOUS | Status: DC

## 2020-03-30 MED ORDER — MIDAZOLAM HCL 2 MG/2ML IJ SOLN
INTRAMUSCULAR | Status: AC
  Filled 2020-03-30: qty 2

## 2020-03-30 MED ORDER — DEXAMETHASONE 4 MG PO TABS
4.0000 mg | ORAL_TABLET | Freq: Three times a day (TID) | ORAL | Status: AC
  Administered 2020-03-31: 4 mg via ORAL
  Filled 2020-03-30: qty 1

## 2020-03-30 MED ORDER — SODIUM CHLORIDE 0.9 % IV SOLN
250.0000 mL | INTRAVENOUS | Status: DC
  Administered 2020-03-30: 250 mL via INTRAVENOUS

## 2020-03-30 MED ORDER — METHOCARBAMOL 500 MG PO TABS
500.0000 mg | ORAL_TABLET | Freq: Four times a day (QID) | ORAL | Status: DC | PRN
  Administered 2020-03-30 – 2020-03-31 (×2): 500 mg via ORAL
  Filled 2020-03-30 (×2): qty 1

## 2020-03-30 MED ORDER — VITAMIN B-12 1000 MCG PO TABS
1000.0000 ug | ORAL_TABLET | Freq: Every day | ORAL | Status: DC
  Administered 2020-03-30: 1000 ug via ORAL
  Filled 2020-03-30: qty 1

## 2020-03-30 MED ORDER — FENTANYL CITRATE (PF) 100 MCG/2ML IJ SOLN: 25.0000 ug | INTRAMUSCULAR | Status: DC | PRN

## 2020-03-30 MED ORDER — ROCURONIUM BROMIDE 10 MG/ML (PF) SYRINGE
PREFILLED_SYRINGE | INTRAVENOUS | Status: DC | PRN
  Administered 2020-03-30: 20 mg via INTRAVENOUS
  Administered 2020-03-30: 50 mg via INTRAVENOUS
  Administered 2020-03-30: 30 mg via INTRAVENOUS

## 2020-03-30 MED ORDER — BUPROPION HCL ER (XL) 300 MG PO TB24
300.0000 mg | ORAL_TABLET | Freq: Every day | ORAL | Status: DC
  Administered 2020-03-30 – 2020-03-31 (×2): 300 mg via ORAL
  Filled 2020-03-30 (×2): qty 1

## 2020-03-30 MED ORDER — 0.9 % SODIUM CHLORIDE (POUR BTL) OPTIME
TOPICAL | Status: DC | PRN
  Administered 2020-03-30: 1000 mL

## 2020-03-30 SURGICAL SUPPLY — 70 items
BAND RUBBER #18 3X1/16 STRL (MISCELLANEOUS) ×6 IMPLANT
BASKET BONE COLLECTION (BASKET) IMPLANT
BIT DRILL NEURO 2X3.1 SFT TUCH (MISCELLANEOUS) ×1 IMPLANT
BIT DRILL OZARK SU 2.3X14 (DRILL) ×1 IMPLANT
BLADE 15 SAFETY STRL DISP (BLADE) ×3 IMPLANT
BLADE ULTRA TIP 2M (BLADE) IMPLANT
BNDG GAUZE ELAST 4 BULKY (GAUZE/BANDAGES/DRESSINGS) IMPLANT
BUR BARREL STRAIGHT FLUTE 4.0 (BURR) ×6 IMPLANT
CANISTER SUCT 3000ML PPV (MISCELLANEOUS) ×3 IMPLANT
CARTRIDGE OIL MAESTRO DRILL (MISCELLANEOUS) ×1 IMPLANT
COVER MAYO STAND STRL (DRAPES) ×3 IMPLANT
COVER WAND RF STERILE (DRAPES) ×3 IMPLANT
DECANTER SPIKE VIAL GLASS SM (MISCELLANEOUS) ×3 IMPLANT
DERMABOND ADVANCED (GAUZE/BANDAGES/DRESSINGS) ×2
DERMABOND ADVANCED .7 DNX12 (GAUZE/BANDAGES/DRESSINGS) ×1 IMPLANT
DIFFUSER DRILL AIR PNEUMATIC (MISCELLANEOUS) ×3 IMPLANT
DRAIN JACKSON PRATT 10MM FLAT (MISCELLANEOUS) ×3 IMPLANT
DRAPE HALF SHEET 40X57 (DRAPES) IMPLANT
DRAPE LAPAROTOMY 100X72 PEDS (DRAPES) ×3 IMPLANT
DRAPE MICROSCOPE LEICA (MISCELLANEOUS) ×3 IMPLANT
DRILL NEURO 2X3.1 SOFT TOUCH (MISCELLANEOUS) ×3
DRILL OZARK SU 2.3X14 (DRILL) ×3
DRSG OPSITE POSTOP 3X4 (GAUZE/BANDAGES/DRESSINGS) ×3 IMPLANT
DURAPREP 6ML APPLICATOR 50/CS (WOUND CARE) ×3 IMPLANT
ELECT COATED BLADE 2.86 ST (ELECTRODE) ×3 IMPLANT
ELECT REM PT RETURN 9FT ADLT (ELECTROSURGICAL) ×3
ELECTRODE REM PT RTRN 9FT ADLT (ELECTROSURGICAL) ×1 IMPLANT
EVACUATOR SILICONE 100CC (DRAIN) ×3 IMPLANT
GAUZE 4X4 16PLY RFD (DISPOSABLE) IMPLANT
GAUZE SPONGE 4X4 12PLY STRL (GAUZE/BANDAGES/DRESSINGS) IMPLANT
GLOVE BIO SURGEON STRL SZ7 (GLOVE) ×3 IMPLANT
GLOVE BIO SURGEON STRL SZ8 (GLOVE) ×9 IMPLANT
GLOVE BIOGEL PI IND STRL 7.5 (GLOVE) ×2 IMPLANT
GLOVE BIOGEL PI IND STRL 8 (GLOVE) ×1 IMPLANT
GLOVE BIOGEL PI IND STRL 8.5 (GLOVE) ×2 IMPLANT
GLOVE BIOGEL PI INDICATOR 7.5 (GLOVE) ×4
GLOVE BIOGEL PI INDICATOR 8 (GLOVE) ×2
GLOVE BIOGEL PI INDICATOR 8.5 (GLOVE) ×4
GLOVE ECLIPSE 8.0 STRL XLNG CF (GLOVE) ×3 IMPLANT
GLOVE EXAM NITRILE XL STR (GLOVE) IMPLANT
GLOVE SS BIOGEL STRL SZ 7 (GLOVE) ×3 IMPLANT
GLOVE SUPERSENSE BIOGEL SZ 7 (GLOVE) ×6
GOWN STRL REUS W/ TWL LRG LVL3 (GOWN DISPOSABLE) IMPLANT
GOWN STRL REUS W/ TWL XL LVL3 (GOWN DISPOSABLE) ×1 IMPLANT
GOWN STRL REUS W/TWL 2XL LVL3 (GOWN DISPOSABLE) ×3 IMPLANT
GOWN STRL REUS W/TWL LRG LVL3 (GOWN DISPOSABLE)
GOWN STRL REUS W/TWL XL LVL3 (GOWN DISPOSABLE) ×2
HALTER HD/CHIN CERV TRACTION D (MISCELLANEOUS) ×3 IMPLANT
HEMOSTAT POWDER KIT SURGIFOAM (HEMOSTASIS) ×6 IMPLANT
KIT BASIN OR (CUSTOM PROCEDURE TRAY) ×3 IMPLANT
KIT TURNOVER KIT B (KITS) ×3 IMPLANT
NEEDLE HYPO 25X1 1.5 SAFETY (NEEDLE) ×3 IMPLANT
NEEDLE SPNL 18GX3.5 QUINCKE PK (NEEDLE) IMPLANT
NEEDLE SPNL 22GX3.5 QUINCKE BK (NEEDLE) ×3 IMPLANT
NS IRRIG 1000ML POUR BTL (IV SOLUTION) ×3 IMPLANT
OIL CARTRIDGE MAESTRO DRILL (MISCELLANEOUS) ×3
PACK LAMINECTOMY NEURO (CUSTOM PROCEDURE TRAY) ×3 IMPLANT
PAD ARMBOARD 7.5X6 YLW CONV (MISCELLANEOUS) ×9 IMPLANT
PEEK SPACER AVS AS 7X14X16X4% (Peek) ×9 IMPLANT
PIN DISTRACTION 14MM (PIN) ×9 IMPLANT
PLATE ANT OZARK 60 3LVL (Plate) ×3 IMPLANT
SCREW VA ST OZARK 4X14 (Screw) ×24 IMPLANT
SPONGE INTESTINAL PEANUT (DISPOSABLE) ×6 IMPLANT
SPONGE SURGIFOAM ABS GEL 100 (HEMOSTASIS) IMPLANT
STAPLER SKIN PROX WIDE 3.9 (STAPLE) IMPLANT
SUT VIC AB 3-0 SH 8-18 (SUTURE) ×3 IMPLANT
TOWEL GREEN STERILE (TOWEL DISPOSABLE) ×3 IMPLANT
TOWEL GREEN STERILE FF (TOWEL DISPOSABLE) ×3 IMPLANT
TRAY FOLEY MTR SLVR 16FR STAT (SET/KITS/TRAYS/PACK) IMPLANT
WATER STERILE IRR 1000ML POUR (IV SOLUTION) ×3 IMPLANT

## 2020-03-30 NOTE — Brief Op Note (Signed)
03/30/2020  10:33 AM  PATIENT:  Curtis Bray.  59 y.o. male  PRE-OPERATIVE DIAGNOSIS:  Cervical stenosis of spinal canal, cervical disc herniation, cervicalgia, cervical myelopathy, cervical radiculopathy C 45, C 56, C 67 levels  POST-OPERATIVE DIAGNOSIS:  Cervical stenosis of spinal canal, cervical disc herniation, cervicalgia, cervical myelopathy, cervical radiculopathy C 45, C 56, C 67 levels  PROCEDURE:  Procedure(s) with comments: Cervical Four-Five/Cervical Five-Six / Cervical Six-Seven  Anterior cervical decompression/discectomy/fusion (N/A) - 3C/RM 21 with PEEK cages, autograft, anterior cervical plate  SURGEON:  Surgeon(s) and Role:    Erline Levine, MD - Primary  PHYSICIAN ASSISTANT: Glenford Peers, NP  ASSISTANTS: P[place], RN   ANESTHESIA:   general  EBL:  30 mL   BLOOD ADMINISTERED:none  DRAINS: (#10) Jackson-Pratt drain(s) with closed bulb suction in the prevertebral space   LOCAL MEDICATIONS USED:  MARCAINE    and LIDOCAINE   SPECIMEN:  No Specimen  DISPOSITION OF SPECIMEN:  N/A  COUNTS:  YES  TOURNIQUET:  * No tourniquets in log *  DICTATION: Patient was brought to operating room and following the smooth and uncomplicated induction of general endotracheal anesthesia his head was placed on a horseshoe head holder he was placed in 5 pounds of Holter traction and his anterior neck was prepped and draped in usual sterile fashion. An incision was made on the left side of midline after infiltrating the skin and subcutaneous tissues with local lidocaine. The platysmal layer was incised and subplatysmal dissection was performed exposing the anterior border sternocleidomastoid muscle. Using blunt dissection the carotid sheath was kept lateral and trachea and esophagus kept medial exposing the anterior cervical spine. A bent spinal needle was placed it was felt to be the C4-5  level and this was confirmed on intraoperative x-ray. Longus coli muscles were taken down from the  anterior cervical spine using electrocautery and key elevator and self-retaining retractor was placed. The interspace at C6-7 was incised and a thorough discectomy was performed. Distraction pins were placed. Uncinate spurs and central spondylitic ridges were drilled down with a high-speed drill. The spinal cord dura and both C7 nerve roots were widely decompressed. Hemostasis was assured. After trial sizing a 7  mm peek interbody cage was selected and packed with local bone autograft. The graft was tamped into position and countersunk appropriately. The retractor was moved and the interspace at C5-6 was incised and a thorough discectomy was performed. Distraction pins were placed. Uncinate spurs and central spondylitic ridges were drilled down with a high-speed drill. The spinal cord dura and both C 6 nerve roots were widely decompressed. Hemostasis was assured. After trial sizing a 7  mm peek interbody cage was selected and packed with local bone autograft. The graft was tamped into position and countersunk appropriately. The interspace at C4-5 was incised and a thorough discectomy was performed. Distraction pins were placed. Uncinate spurs and central spondylitic ridges were drilled down with a high-speed drill. The spinal cord dura and both C5 nerve roots were widely decompressed. Hemostasis was assured.  After trial sizing a 7  mm peek interbody cage was selected and packed with local bone autograft. The graft was tamped into position and countersunk appropriately.   Distraction weight was removed. A 60 mm [place]  anterior cervical plate was affixed to the cervical spine with 14 mm variable-angle screws 2 at C4, 2 at C5, 2 at C6,  and 2 at C7. All screws were well-positioned and locking mechanisms were engaged. Soft tissues were inspected  and found to be in good repair. The wound was irrigated. A final x-ray was obtained with good visualization at C4 with the interbody graft well visualized. A # 10 JP drain was  inserted through a separate stab incision.  The platysma layer was closed with 3-0 Vicryl stitches and the skin was reapproximated with 3-0 Vicryl subcuticular stitches. The wound was dressed with Dermabond and an occlusive dressing. Counts were correct at the end of the case. Patient was extubated and taken to recovery in stable and satisfactory condition.    PLAN OF CARE: Admit for overnight observation  PATIENT DISPOSITION:  PACU - hemodynamically stable.   Delay start of Pharmacological VTE agent (>24hrs) due to surgical blood loss or risk of bleeding: yes

## 2020-03-30 NOTE — Transfer of Care (Signed)
Immediate Anesthesia Transfer of Care Note  Patient: Curtis Bray.  Procedure(s) Performed: Cervical Four-Five/Cervical Five-Six / Cervical Six-Seven  Anterior cervical decompression/discectomy/fusion (N/A )  Patient Location: PACU  Anesthesia Type:General  Level of Consciousness: awake, alert  and oriented  Airway & Oxygen Therapy: Patient Spontanous Breathing and Patient connected to face mask oxygen  Post-op Assessment: Report given to RN, Post -op Vital signs reviewed and stable and Patient moving all extremities X 4  Post vital signs: Reviewed and stable  Last Vitals:  Vitals Value Taken Time  BP 152/91 03/30/20 1044  Temp    Pulse 102 03/30/20 1044  Resp 16 03/30/20 1044  SpO2 99 % 03/30/20 1044  Vitals shown include unvalidated device data.  Last Pain:  Vitals:   03/30/20 0606  TempSrc:   PainSc: 2       Patients Stated Pain Goal: 3 (47/07/[birth year] 5183)  Complications: No complications documented.

## 2020-03-30 NOTE — Interval H&P Note (Signed)
History and Physical Interval Note:  03/30/2020 7:27 AM  Curtis Bray.  has presented today for surgery, with the diagnosis of Cervical stenosis of spinal canal.  The various methods of treatment have been discussed with the patient and family. After consideration of risks, benefits and other options for treatment, the patient has consented to  Procedure(s) with comments: Cervical 4-5, Cervical 5-6, Cervical 6-7 Anterior cervical decompression/discectomy/fusion (N/A) - 3C/RM 21 as a surgical intervention.  The patient's history has been reviewed, patient examined, no change in status, stable for surgery.  I have reviewed the patient's chart and labs.  Questions were answered to the patient's satisfaction.     Peggyann Shoals

## 2020-03-30 NOTE — Anesthesia Preprocedure Evaluation (Signed)
Anesthesia Evaluation   Patient awake    Reviewed: Allergy & Precautions, NPO status , Patient's Chart, lab work & pertinent test results  Airway Mallampati: III  TM Distance: >3 FB Neck ROM: Limited    Dental  (+) Teeth Intact, Dental Advisory Given   Pulmonary former smoker,    breath sounds clear to auscultation       Cardiovascular  Rhythm:Regular Rate:Normal     Neuro/Psych    GI/Hepatic   Endo/Other    Renal/GU      Musculoskeletal   Abdominal (+) + obese,   Peds  Hematology   Anesthesia Other Findings   Reproductive/Obstetrics                             Anesthesia Physical Anesthesia Plan  ASA: III  Anesthesia Plan: General   Post-op Pain Management:    Induction: Intravenous  PONV Risk Score and Plan: Ondansetron and Dexamethasone  Airway Management Planned: Oral ETT and Video Laryngoscope Planned  Additional Equipment:   Intra-op Plan:   Post-operative Plan: Extubation in OR  Informed Consent: I have reviewed the patients History and Physical, chart, labs and discussed the procedure including the risks, benefits and alternatives for the proposed anesthesia with the patient or authorized representative who has indicated his/her understanding and acceptance.     Dental advisory given  Plan Discussed with: CRNA and Anesthesiologist  Anesthesia Plan Comments: (Cervical spondylosis with myelopathy OSA on CPAP Obesity  Plan GA with glide scope intubation)        Anesthesia Quick Evaluation

## 2020-03-30 NOTE — Progress Notes (Signed)
Orthopedic Tech Progress Note Patient Details:  Curtis Bray [birth year] [id number] Patient has brace Patient ID: Curtis Ran., male   DOB: [birth year], 59 y.o.   MRN: [id number]   Curtis Bray 03/30/2020, 11:00 AM

## 2020-03-30 NOTE — Evaluation (Signed)
Physical Therapy Evaluation Patient Details Name: Curtis Bray. MRN: [id number] DOB: May 01, 1961 Today's Date: 03/30/2020   History of Present Illness  Patient is a 59 year old male with long standing neck pain radiating into R shoulder to forearm and thumb. PMH includes prostate cancer, depression, chronic R shoulder pain, B carpal tunnel release late 1990s. Patient s/p anterior cervical decompression and fusion (C4-7).  Clinical Impression  Patient presents with deficits in endurance, strength, balance. Patient ambulated 250' with no AD and min guard for safety. Overall with bed mobility and transfers, patient requires supervision. Patient deferred stairs due to complaints of R hip pain and discomfort during ambulation. Anticipate no PT follow up at this time. Patient will benefit from skilled PT services during acute stay to address listed deficits.     Follow Up Recommendations No PT follow up    Equipment Recommendations  None recommended by PT    Recommendations for Other Services       Precautions / Restrictions Precautions Precautions: Cervical Precaution Booklet Issued: Yes (comment) Required Braces or Orthoses: Cervical Brace Cervical Brace: Hard collar;At all times      Mobility  Bed Mobility Overal bed mobility: Needs Assistance Bed Mobility: Supine to Sit;Sit to Supine     Supine to sit: Supervision;HOB elevated Sit to supine: Min guard;HOB elevated        Transfers Overall transfer level: Needs assistance Equipment used: None Transfers: Sit to/from Stand Sit to Stand: Supervision            Ambulation/Gait Ambulation/Gait assistance: Min guard Gait Distance (Feet): 250 Feet Assistive device: None Gait Pattern/deviations: Step-through pattern;Wide base of support;Drifts right/left     General Gait Details: complains of R hip hurting   Stairs Stairs:  (deferred due to R hip pain and discomfort )          Wheelchair Mobility     Modified Rankin (Stroke Patients Only)       Balance Overall balance assessment: Needs assistance Sitting-balance support: Feet supported Sitting balance-Leahy Scale: Good     Standing balance support: No upper extremity supported;During functional activity Standing balance-Leahy Scale: Fair                               Pertinent Vitals/Pain Pain Assessment: Faces Faces Pain Scale: Hurts little more Pain Location: neck Pain Descriptors / Indicators: Grimacing;Guarding Pain Intervention(s): Limited activity within patient's tolerance;Monitored during session;Repositioned    Home Living Family/patient expects to be discharged to:: Private residence Living Arrangements: Spouse/significant other Available Help at Discharge: Family Type of Home: House Home Access: Stairs to enter Entrance Stairs-Rails: None (brick wall on one side) Entrance Stairs-Number of Steps: 4 Home Layout: One level (with basement ) Home Equipment: Cane - single point (states he has alot of equipment but does not provide details) Additional Comments: has adjustable bed at home    Prior Function Level of Independence: Independent         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)     Journalist, newspaper        Extremity/Trunk Assessment   Upper Extremity Assessment Upper Extremity Assessment: Defer to OT evaluation    Lower Extremity Assessment Lower Extremity Assessment: Overall WFL for tasks assessed       Communication   Communication: No difficulties  Cognition Arousal/Alertness: Awake/alert Behavior During Therapy: WFL for tasks assessed/performed Overall Cognitive Status: Within Functional Limits for tasks assessed  General Comments General comments (skin integrity, edema, etc.): wife present during session    Exercises     Assessment/Plan    PT Assessment Patient needs continued PT services   PT Problem List Decreased strength;Decreased activity tolerance;Decreased balance;Decreased mobility;Pain       PT Treatment Interventions Gait training;Stair training;Functional mobility training;Therapeutic activities;Therapeutic exercise;Balance training;Patient/family education    PT Goals (Current goals can be found in the Care Plan section)  Acute Rehab PT Goals Patient Stated Goal: to go home PT Goal Formulation: With patient/family Time For Goal Achievement: 04/13/20 Potential to Achieve Goals: Good    Frequency Min 5X/week   Barriers to discharge        Co-evaluation               AM-PAC PT "6 Clicks" Mobility  Outcome Measure Help needed turning from your back to your side while in a flat bed without using bedrails?: None Help needed moving from lying on your back to sitting on the side of a flat bed without using bedrails?: None Help needed moving to and from a bed to a chair (including a wheelchair)?: A Little Help needed standing up from a chair using your arms (e.g., wheelchair or bedside chair)?: None Help needed to walk in hospital room?: A Little Help needed climbing 3-5 steps with a railing? : A Little 6 Click Score: 21    End of Session Equipment Utilized During Treatment: Gait belt Activity Tolerance: Patient tolerated treatment well Patient left: in bed;with call bell/phone within reach;with nursing/sitter in room;with family/visitor present Nurse Communication: Mobility status PT Visit Diagnosis: Unsteadiness on feet (R26.81);Muscle weakness (generalized) (M62.81);Pain Pain - part of body:  (Neck)    Time: 1547-1610 PT Time Calculation (min) (ACUTE ONLY): 23 min   Charges:   PT Evaluation $PT Eval Low Complexity: 1 Low          Perrin Maltese, PT, DPT Acute Rehabilitation Services Pager [phone] Office [phone]   Curtis Bray 03/30/2020, 4:25 PM

## 2020-03-30 NOTE — Progress Notes (Signed)
PHARMACIST - PHYSICIAN ORDER COMMUNICATION  CONCERNING: P&T Medication Policy on Herbal Medications  DESCRIPTION:  This patient's order for:  Co Q 10  has been noted.  This product(s) is classified as an "herbal" or natural product. Due to a lack of definitive safety studies or FDA approval, nonstandard manufacturing practices, plus the potential risk of unknown drug-drug interactions while on inpatient medications, the Pharmacy and Therapeutics Committee does not permit the use of "herbal" or natural products of this type within [facility].   ACTION TAKEN: The pharmacy department is unable to verify this order at this time and your patient has been informed of this safety policy. Please reevaluate patient's clinical condition at discharge and address if the herbal or natural product(s) should be resumed at that time.   Aksel Bencomo A. Earnest Mcgillis, PharmD, BCPS, FNKF Clinical Pharmacist [facility] Please utilize Amion for appropriate phone number to reach the unit pharmacist ([facility])  

## 2020-03-30 NOTE — Anesthesia Procedure Notes (Signed)
Procedure Name: Intubation Date/Time: 03/30/2020 7:56 AM Performed by: Harden Mo, CRNA Pre-anesthesia Checklist: Patient identified, Emergency Drugs available, Suction available and Patient being monitored Patient Re-evaluated:Patient Re-evaluated prior to induction Oxygen Delivery Method: Circle System Utilized Preoxygenation: Pre-oxygenation with 100% oxygen Induction Type: IV induction and Rapid sequence Ventilation: Mask ventilation with difficulty, Two handed mask ventilation required and Oral airway inserted - appropriate to patient size Laryngoscope Size: Glidescope and 4 Grade View: Grade I Tube type: Oral Tube size: 7.5 mm Number of attempts: 1 Airway Equipment and Method: Stylet and Oral airway Placement Confirmation: ETT inserted through vocal cords under direct vision,  positive ETCO2 and breath sounds checked- equal and bilateral Secured at: 24 cm Tube secured with: Tape Dental Injury: Teeth and Oropharynx as per pre-operative assessment

## 2020-03-30 NOTE — Op Note (Signed)
03/30/2020  10:33 AM  PATIENT:  Curtis Bray.  59 y.o. male  PRE-OPERATIVE DIAGNOSIS:  Cervical stenosis of spinal canal, cervical disc herniation, cervicalgia, cervical myelopathy, cervical radiculopathy C 45, C 56, C 67 levels  POST-OPERATIVE DIAGNOSIS:  Cervical stenosis of spinal canal, cervical disc herniation, cervicalgia, cervical myelopathy, cervical radiculopathy C 45, C 56, C 67 levels  PROCEDURE:  Procedure(s) with comments: Cervical Four-Five/Cervical Five-Six / Cervical Six-Seven  Anterior cervical decompression/discectomy/fusion (N/A) - 3C/RM 21 with PEEK cages, autograft, anterior cervical plate  SURGEON:  Surgeon(s) and Role:    Erline Levine, MD - Primary  PHYSICIAN ASSISTANT: Glenford Peers, NP  ASSISTANTS: P[place]at, RN   ANESTHESIA:   general  EBL:  30 mL   BLOOD ADMINISTERED:none  DRAINS: (#10) Jackson-Pratt drain(s) with closed bulb suction in the prevertebral space   LOCAL MEDICATIONS USED:  MARCAINE    and LIDOCAINE   SPECIMEN:  No Specimen  DISPOSITION OF SPECIMEN:  N/A  COUNTS:  YES  TOURNIQUET:  * No tourniquets in log *  DICTATION: Patient was brought to operating room and following the smooth and uncomplicated induction of general endotracheal anesthesia his head was placed on a horseshoe head holder he was placed in 5 pounds of Holter traction and his anterior neck was prepped and draped in usual sterile fashion. An incision was made on the left side of midline after infiltrating the skin and subcutaneous tissues with local lidocaine. The platysmal layer was incised and subplatysmal dissection was performed exposing the anterior border sternocleidomastoid muscle. Using blunt dissection the carotid sheath was kept lateral and trachea and esophagus kept medial exposing the anterior cervical spine. A bent spinal needle was placed it was felt to be the C4-5  level and this was confirmed on intraoperative x-ray. Longus coli muscles were taken down from the  anterior cervical spine using electrocautery and key elevator and self-retaining retractor was placed. The interspace at C6-7 was incised and a thorough discectomy was performed. Distraction pins were placed. Uncinate spurs and central spondylitic ridges were drilled down with a high-speed drill. The spinal cord dura and both C7 nerve roots were widely decompressed. Hemostasis was assured. After trial sizing a 7  mm peek interbody cage was selected and packed with local bone autograft. The graft was tamped into position and countersunk appropriately. The retractor was moved and the interspace at C5-6 was incised and a thorough discectomy was performed. Distraction pins were placed. Uncinate spurs and central spondylitic ridges were drilled down with a high-speed drill. The spinal cord dura and both C 6 nerve roots were widely decompressed. Hemostasis was assured. After trial sizing a 7  mm peek interbody cage was selected and packed with local bone autograft. The graft was tamped into position and countersunk appropriately. The interspace at C4-5 was incised and a thorough discectomy was performed. Distraction pins were placed. Uncinate spurs and central spondylitic ridges were drilled down with a high-speed drill. The spinal cord dura and both C5 nerve roots were widely decompressed. Hemostasis was assured.  After trial sizing a 7  mm peek interbody cage was selected and packed with local bone autograft. The graft was tamped into position and countersunk appropriately.   Distraction weight was removed. A 60 mm [place]  anterior cervical plate was affixed to the cervical spine with 14 mm variable-angle screws 2 at C4, 2 at C5, 2 at C6,  and 2 at C7. All screws were well-positioned and locking mechanisms were engaged. Soft tissues were inspected  and found to be in good repair. The wound was irrigated. A final x-ray was obtained with good visualization at C4 with the interbody graft well visualized. A # 10 JP drain was  inserted through a separate stab incision.  The platysma layer was closed with 3-0 Vicryl stitches and the skin was reapproximated with 3-0 Vicryl subcuticular stitches. The wound was dressed with Dermabond and an occlusive dressing. Counts were correct at the end of the case. Patient was extubated and taken to recovery in stable and satisfactory condition.    PLAN OF CARE: Admit for overnight observation  PATIENT DISPOSITION:  PACU - hemodynamically stable.   Delay start of Pharmacological VTE agent (>24hrs) due to surgical blood loss or risk of bleeding: yes

## 2020-03-31 DIAGNOSIS — M4802 Spinal stenosis, cervical region: Secondary | ICD-10-CM | POA: Diagnosis not present

## 2020-03-31 MED ORDER — OXYCODONE HCL 5 MG PO TABS
5.0000 mg | ORAL_TABLET | Freq: Four times a day (QID) | ORAL | 0 refills | Status: DC | PRN
Start: 2020-03-31 — End: 2020-04-10

## 2020-03-31 NOTE — Discharge Instructions (Signed)
Wound Care Remove outer dressing in 2-3 days Leave incision open to air. You may shower. Do not scrub directly on incision.  Do not put any creams, lotions, or ointments on incision. Activity Walk each and every day, increasing distance each day. No lifting greater than 5 lbs.  Avoid excessive neck motion. No driving for 2 weeks; may ride as a passenger locally. Wear neck brace at all times except when showering.  Diet Resume your normal diet.  Return to Work Will be discussed at you follow up appointment. Call Your Doctor If Any of These Occur Redness, drainage, or swelling at the wound.  Temperature greater than 101 degrees. Severe pain not relieved by pain medication. Increased difficulty swallowing. Incision starts to come apart. Follow Up Appt Call today for appointment in 2-3 weeks ([phone]) or for problems.  If you have any hardware placed in your spine, you will need an x-ray before your appointment. 

## 2020-03-31 NOTE — Discharge Summary (Signed)
Physician Discharge Summary  Patient ID: Curtis Bray. MRN: [id number] DOB/AGE: 59-Jul-1962 59 y.o.  Admit date: 03/30/2020 Discharge date: 03/31/2020  Admission Diagnoses: Cervical spinal stenosis    Discharge Diagnoses: Same   Discharged Condition: good  Hospital Course: The patient was admitted on 03/30/2020 and taken to the operating room where the patient underwent 3 level ACDF. The patient tolerated the procedure well and was taken to the recovery room and then to the floor in stable condition. The hospital course was routine. There were no complications. The wound remained clean dry and intact. Pt had appropriate neck soreness. No complaints of arm pain or new N/T/W. The patient remained afebrile with stable vital signs, and tolerated a regular diet. The patient continued to increase activities, and pain was well controlled with oral pain medications.   Consults: None  Significant Diagnostic Studies:  Results for orders placed or performed during the hospital encounter of 03/30/20  ABO/Rh  Result Value Ref Range   ABO/RH(D)      O NEG Performed at [facility] [address], [place], [place] [address]     DG Cervical Spine 2-3 Views  Result Date: 03/30/2020 CLINICAL DATA:  Intraoperative imaging.  Anterior cervical fusion EXAM: CERVICAL SPINE - 2-3 VIEW COMPARISON:  MRI 12/29/2019 FINDINGS: Intubated patient.  Sharp tip probe at the C4-C5 disc space. IMPRESSION: Intraoperative radiograph as above. Electronically Signed   By: Suzy Bouchard M.D.   On: 03/30/2020 10:49    Antibiotics:  Anti-infectives (From admission, onward)   Start     Dose/Rate Route Frequency Ordered Stop   03/30/20 1345  ceFAZolin (ANCEF) IVPB 2g/100 mL premix        2 g 200 mL/hr over 30 Minutes Intravenous Every 8 hours 03/30/20 1250 03/30/20 2110   03/30/20 0600  ceFAZolin (ANCEF) 3 g in dextrose 5 % 50 mL IVPB  Status:  Discontinued        3 g 100 mL/hr over 30 Minutes Intravenous  On call to O.R. 03/29/20 1227 03/30/20 1246      Discharge Exam: Blood pressure (!) 141/79, pulse 99, temperature 98.4 F (36.9 C), temperature source Oral, resp. rate 18, height 5\' 10"  (1.778 m), weight 127 kg, SpO2 96 %. Neurologic: Grossly normal Dressing dry  Discharge Medications:   Allergies as of 03/31/2020   No Known Allergies     Medication List    TAKE these medications   Aspir-Low 81 MG EC tablet Generic drug: aspirin Take 81 mg by mouth daily.   buPROPion 300 MG 24 hr tablet Commonly known as: WELLBUTRIN XL Take 1 tablet (300 mg total) by mouth daily.   CALCIUM PO Take 1 tablet by mouth 2 (two) times a day.   CoQ10 200 MG Caps Take 200 mg by mouth at bedtime.   fluticasone 50 MCG/ACT nasal spray Commonly known as: FLONASE Place 2 sprays into both nostrils daily.   gabapentin 600 MG tablet Commonly known as: NEURONTIN Take 600-900 mg by mouth See admin instructions. Take 600 mg by mouth in the morning and 900 mg at night   ibuprofen 200 MG tablet Commonly known as: ADVIL Take 800 mg by mouth every 8 (eight) hours as needed (pain.).   methocarbamol 500 MG tablet Commonly known as: ROBAXIN Take 250-500 mg by mouth 2 (two) times daily as needed for muscle spasms.   oxyCODONE 5 MG immediate release tablet Commonly known as: Oxy IR/ROXICODONE Take 1-2 tablets (5-10 mg total) by mouth every  6 (six) hours as needed for severe pain.   RABEprazole 20 MG tablet Commonly known as: ACIPHEX Take 2 tablets (40 mg total) by mouth daily.   simvastatin 40 MG tablet Commonly known as: ZOCOR Take 1 tablet (40 mg total) by mouth at bedtime.   tadalafil 10 MG tablet Commonly known as: Cialis Take 1 tablet (10 mg total) by mouth daily as needed for erectile dysfunction.   tamsulosin 0.4 MG Caps capsule Commonly known as: FLOMAX Take 2 capsules (0.8 mg total) by mouth daily.   traZODone 50 MG tablet Commonly known as: DESYREL Take 2 tablets (100 mg total) by  mouth at bedtime.   vitamin B-12 1000 MCG tablet Commonly known as: CYANOCOBALAMIN Take 1,000 mcg by mouth at bedtime.   Vitamin D-3 125 MCG (5000 UT) Tabs Take 5,000 Units by mouth at bedtime.       Disposition: Home   Final Dx: ACDF  Discharge Instructions    Call MD for:  persistant nausea and vomiting   Complete by: As directed    Call MD for:  redness, tenderness, or signs of infection (pain, swelling, redness, odor or green/yellow discharge around incision site)   Complete by: As directed    Call MD for:  severe uncontrolled pain   Complete by: As directed    Call MD for:  temperature >100.4   Complete by: As directed    Diet - low sodium heart healthy   Complete by: As directed    Discharge instructions   Complete by: As directed    Resume aspirin in 5 days   Increase activity slowly   Complete by: As directed    Remove dressing in 48 hours   Complete by: As directed        Follow-up Information    Erline Levine, MD. Schedule an appointment as soon as possible for a visit in 2 week(s).   Specialty: Neurosurgery Contact information: [address] [place] [address] [phone]                Signed: Eustace Moore 03/31/2020, 9:23 AM

## 2020-03-31 NOTE — Progress Notes (Signed)
Patient alert and oriented, mae's well, voiding adequate amount of urine, swallowing without difficulty, no c/o pain at time of discharge. Patient discharged home with family. Script and discharged instructions given to patient. Patient and family stated understanding of instructions given. Patient has an appointment with Dr.Stern    

## 2020-03-31 NOTE — Progress Notes (Signed)
Physical Therapy Treatment Patient Details Name: Curtis Bray. MRN: [id number] DOB: January 17, 1961 Today's Date: 03/31/2020    History of Present Illness Patient is a 59 year old male with long standing neck pain radiating into R shoulder to forearm and thumb. PMH includes prostate cancer, depression, chronic R shoulder pain, B carpal tunnel release late 1990s. Patient s/p anterior cervical decompression and fusion (C4-7).    PT Comments    Pt reporting pain across bilateral shoulders; RN notified for pain medication. Pt ambulating hallway distances without an AD and negotiated 10 steps with and without a railing without physical difficulty. Reviewed brace use and fit, cervical precautions and activity restrictions. Pt verbalized understanding and has no further questions/concerns.    Follow Up Recommendations  No PT follow up     Equipment Recommendations  None recommended by PT    Recommendations for Other Services       Precautions / Restrictions Precautions Precautions: Cervical Precaution Booklet Issued: Yes (comment) Required Braces or Orthoses: Cervical Brace Cervical Brace: Hard collar;At all times Restrictions Weight Bearing Restrictions: No    Mobility  Bed Mobility Overal bed mobility: Modified Independent Bed Mobility: Supine to Sit;Sit to Supine     Supine to sit: Modified independent (Device/Increase time) Sit to supine: Modified independent (Device/Increase time)      Transfers Overall transfer level: Independent Equipment used: None Transfers: Sit to/from Curtis Bray to Stand: Independent Stand pivot transfers: Independent          Ambulation/Gait Ambulation/Gait assistance: Independent Gait Distance (Feet): 400 Feet Assistive device: None Gait Pattern/deviations: Step-through pattern     General Gait Details: no apparent gait deficits, steady pace   Stairs Stairs: Yes Stairs assistance: Supervision Stair  Management: One rail Left;No rails Number of Stairs: 10 General stair comments: Cues for step by step descending for safety   Wheelchair Mobility    Modified Rankin (Stroke Patients Only)       Balance Overall balance assessment: Needs assistance Sitting-balance support: Feet supported Sitting balance-Curtis Bray: Normal     Standing balance support: No upper extremity supported;During functional activity Standing balance-Curtis Bray: Good                              Cognition Arousal/Alertness: Awake/alert Behavior During Therapy: WFL for tasks assessed/performed Overall Cognitive Status: Within Functional Limits for tasks assessed                                        Exercises      General Comments        Pertinent Vitals/Pain Pain Assessment: Faces Faces Pain Bray: Hurts little more Pain Location: neck Pain Descriptors / Indicators: Grimacing;Guarding Pain Intervention(s): Monitored during session;Patient requesting pain meds-RN notified    Home Living Family/patient expects to be discharged to:: Private residence Living Arrangements: Spouse/significant other Available Help at Discharge: Family Type of Home: House Home Access: Stairs to enter Entrance Stairs-Rails: None Home Layout: One level Home Equipment: Cane - single point Additional Comments: has adjustable bed at home    Prior Function Level of Independence: Independent      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)   PT Goals (current goals can now be found in the care plan section) Acute Rehab PT Goals Patient Stated Goal: to go home PT Goal Formulation: With patient/family  Time For Goal Achievement: 04/13/20 Potential to Achieve Goals: Good Progress towards PT goals: Progressing toward goals    Frequency    Min 5X/week      PT Plan Current plan remains appropriate    Co-evaluation              AM-PAC PT "6 Clicks" Mobility    Outcome Measure  Help needed turning from your back to your side while in a flat bed without using bedrails?: None Help needed moving from lying on your back to sitting on the side of a flat bed without using bedrails?: None Help needed moving to and from a bed to a chair (including a wheelchair)?: None Help needed standing up from a chair using your arms (e.g., wheelchair or bedside chair)?: None Help needed to walk in hospital room?: None Help needed climbing 3-5 steps with a railing? : None 6 Click Score: 24    End of Session Equipment Utilized During Treatment: Gait belt Activity Tolerance: Patient tolerated treatment well Patient left: in bed;with call bell/phone within reach;with nursing/sitter in room;with family/visitor present Nurse Communication: Mobility status PT Visit Diagnosis: Unsteadiness on feet (R26.81);Muscle weakness (generalized) (M62.81);Pain Pain - part of body:  (Neck)     Time: 8875-7972 PT Time Calculation (min) (ACUTE ONLY): 11 min  Charges:  $Therapeutic Activity: 8-22 mins                     Wyona Almas, PT, DPT Acute Rehabilitation Services Pager [phone] Office [phone]    Deno Etienne 03/31/2020, 12:38 PM

## 2020-03-31 NOTE — Evaluation (Signed)
Occupational Therapy Evaluation Patient Details Name: Curtis Bray. MRN: [id number] DOB: 20-Jun-1960 Today's Date: 03/31/2020    History of Present Illness Patient is a 59 year old male with long standing neck pain radiating into R shoulder to forearm and thumb. PMH includes prostate cancer, depression, chronic R shoulder pain, B carpal tunnel release late 1990s. Patient s/p anterior cervical decompression and fusion (C4-7).   Clinical Impression   Patient was admitted for the above diagnosis and procedure.  He presents with expected discomfort to his neck, but is also experiencing R shoulder pain.  Nursing was notified.  OT reviewed some basic AROM: shoulder shrugs, shoulder rolls and scapular pinches.  Otherwise, he is completing the bulk of his ADL at his prior level of function, and demonstrating good follow through with all education given regarding precautions.  No further OT is needed in the acute or post acute settings.  He plans on discharging today.        Follow Up Recommendations  No OT follow up    Equipment Recommendations  None recommended by OT    Recommendations for Other Services       Precautions / Restrictions Precautions Precautions: Cervical Precaution Booklet Issued: Yes (comment) Required Braces or Orthoses: Cervical Brace Cervical Brace: For comfort Restrictions Weight Bearing Restrictions: No      Mobility Bed Mobility Overal bed mobility: Modified Independent Bed Mobility: Supine to Sit;Sit to Supine     Supine to sit: Modified independent (Device/Increase time) Sit to supine: Modified independent (Device/Increase time)        Transfers Overall transfer level: Independent Equipment used: None Transfers: Sit to/from [employer] to Stand: Independent Stand pivot transfers: Independent            Balance Overall balance assessment: No apparent balance deficits (not formally assessed)                                          ADL either performed or assessed with clinical judgement   ADL Overall ADL's : Modified independent                                       General ADL Comments: Patient is doing quite well, able to complete ADL from modified sitting position when needed.     Vision Baseline Vision/History: No visual deficits Patient Visual Report: No change from baseline       Perception     Praxis      Pertinent Vitals/Pain Faces Pain Scale: Hurts little more Pain Location: Nech and R shoulder Pain Descriptors / Indicators: Grimacing;Guarding Pain Intervention(s): Monitored during session     Hand Dominance Right   Extremity/Trunk Assessment Upper Extremity Assessment Upper Extremity Assessment: Overall WFL for tasks assessed   Lower Extremity Assessment Lower Extremity Assessment: Defer to PT evaluation   Cervical / Trunk Assessment Cervical / Trunk Assessment: Normal   Communication Communication Communication: No difficulties   Cognition Arousal/Alertness: Awake/alert Behavior During Therapy: WFL for tasks assessed/performed Overall Cognitive Status: Within Functional Limits for tasks assessed  Home Living Family/patient expects to be discharged to:: Private residence Living Arrangements: Spouse/significant other Available Help at Discharge: Family Type of Home: House Home Access: Stairs to enter Technical brewer of Steps: 4 Entrance Stairs-Rails: None Home Layout: One level     Bathroom Shower/Tub: Occupational psychologist: Handicapped height Bathroom Accessibility: Yes How Accessible: Accessible via [place]: [place] - single point   Additional Comments: has adjustable bed at home      Prior Functioning/Environment Level of Independence: Independent        Comments: works as Estate manager/land agent)         OT Problem List: Pain      OT Treatment/Interventions:      OT Goals(Current goals can be found in the care plan section) Acute Rehab OT Goals Patient Stated Goal: Get back to work OT Goal Formulation: With patient Time For Goal Achievement: 04/06/20 Potential to Achieve Goals: Good  OT Frequency:     Barriers to D/C:  none noted          Co-evaluation              AM-PAC OT "6 Clicks" Daily Activity     Outcome Measure Help from another person eating meals?: None Help from another person taking care of personal grooming?: None Help from another person toileting, which includes using toliet, bedpan, or urinal?: None Help from another person bathing (including washing, rinsing, drying)?: None Help from another person to put on and taking off regular upper body clothing?: None Help from another person to put on and taking off regular lower body clothing?: None 6 Click Score: 24   End of Session Nurse Communication: Mobility status  Activity Tolerance: Patient tolerated treatment well Patient left: in bed;with call bell/phone within reach  OT Visit Diagnosis: Pain Pain - Right/Left: Right Pain - part of body: Shoulder (neck)                Time: 9030-0923 OT Time Calculation (min): 19 min Charges:  OT General Charges $OT Visit: 1 Visit OT Evaluation $OT Eval Low Complexity: 1 Low  03/31/2020  Rich, OTR/L  Acute Rehabilitation Services  Office:  [phone]   Metta Clines 03/31/2020, 9:09 AM

## 2020-04-02 ENCOUNTER — Encounter (HOSPITAL_COMMUNITY): Payer: Self-pay | Admitting: Neurosurgery

## 2020-04-06 ENCOUNTER — Encounter: Payer: Self-pay | Admitting: Family Medicine

## 2020-04-07 NOTE — Anesthesia Postprocedure Evaluation (Signed)
Anesthesia Post Note  Patient: Curtis Bray.  Procedure(s) Performed: Cervical Four-Five/Cervical Five-Six / Cervical Six-Seven  Anterior cervical decompression/discectomy/fusion (N/A )     Patient location during evaluation: PACU Anesthesia Type: General Level of consciousness: awake and alert Pain management: pain level controlled Vital Signs Assessment: post-procedure vital signs reviewed and stable Respiratory status: spontaneous breathing, nonlabored ventilation, respiratory function stable and patient connected to nasal cannula oxygen Cardiovascular status: blood pressure returned to baseline and stable Postop Assessment: no apparent nausea or vomiting Anesthetic complications: no   No complications documented.  Last Vitals:  Vitals:   03/31/20 0456 03/31/20 0756  BP: 122/79 (!) 141/79  Pulse: 86 99  Resp: 18 18  Temp: 36.8 C 36.9 C  SpO2: 93% 96%    Last Pain:  Vitals:   03/31/20 0756  TempSrc: Oral  PainSc:                  [name]

## 2020-04-10 ENCOUNTER — Emergency Department: Payer: BC Managed Care – PPO

## 2020-04-10 ENCOUNTER — Ambulatory Visit (INDEPENDENT_AMBULATORY_CARE_PROVIDER_SITE_OTHER): Payer: BC Managed Care – PPO | Admitting: Surgery

## 2020-04-10 ENCOUNTER — Emergency Department
Admission: EM | Admit: 2020-04-10 | Discharge: 2020-04-10 | Disposition: A | Discharge status: Home / Self Care | Payer: BC Managed Care – PPO | Attending: Emergency Medicine | Admitting: Emergency Medicine

## 2020-04-10 ENCOUNTER — Other Ambulatory Visit: Payer: Self-pay

## 2020-04-10 ENCOUNTER — Encounter: Payer: Self-pay | Admitting: Surgery

## 2020-04-10 VITALS — BP 137/80 | HR 80 | Temp 98.5°F | Ht 70.5 in | Wt 275.8 lb

## 2020-04-10 DIAGNOSIS — Z9889 Other specified postprocedural states: Secondary | ICD-10-CM | POA: Insufficient documentation

## 2020-04-10 DIAGNOSIS — K429 Umbilical hernia without obstruction or gangrene: Secondary | ICD-10-CM

## 2020-04-10 DIAGNOSIS — M5412 Radiculopathy, cervical region: Secondary | ICD-10-CM | POA: Insufficient documentation

## 2020-04-10 DIAGNOSIS — M25511 Pain in right shoulder: Secondary | ICD-10-CM | POA: Diagnosis present

## 2020-04-10 MED ORDER — PREDNISONE 10 MG PO TABS: 10.0000 mg | ORAL_TABLET | Freq: Every day | ORAL | 0 refills | Status: DC

## 2020-04-10 MED ORDER — ORPHENADRINE CITRATE 30 MG/ML IJ SOLN
60.0000 mg | Freq: Once | INTRAMUSCULAR | Status: AC
  Administered 2020-04-10: 60 mg via INTRAMUSCULAR
  Filled 2020-04-10: qty 2

## 2020-04-10 MED ORDER — MELOXICAM 15 MG PO TABS: 15.0000 mg | ORAL_TABLET | Freq: Every day | ORAL | 0 refills | Status: DC

## 2020-04-10 MED ORDER — DEXAMETHASONE SODIUM PHOSPHATE 10 MG/ML IJ SOLN
10.0000 mg | Freq: Once | INTRAMUSCULAR | Status: AC
  Administered 2020-04-10: 10 mg via INTRAMUSCULAR
  Filled 2020-04-10: qty 1

## 2020-04-10 MED ORDER — KETOROLAC TROMETHAMINE 30 MG/ML IJ SOLN
30.0000 mg | Freq: Once | INTRAMUSCULAR | Status: AC
  Administered 2020-04-10: 30 mg via INTRAMUSCULAR
  Filled 2020-04-10: qty 1

## 2020-04-10 MED ORDER — CYCLOBENZAPRINE HCL 10 MG PO TABS: 10.0000 mg | ORAL_TABLET | Freq: Three times a day (TID) | ORAL | 0 refills | Status: DC | PRN

## 2020-04-10 NOTE — Patient Instructions (Signed)
Would like for you to lose weight and recuperate from neck surgery before surgery to repair the hernia.  If you have concerns regarding the hernia, do not hesitate to call our office.        Umbilical Hernia, Adult   A hernia is a bulge of tissue that pushes through an opening between muscles. An umbilical hernia happens in the abdomen, near the belly button (umbilicus). The hernia may contain tissues from the small intestine, large intestine, or fatty tissue covering the intestines (omentum). Umbilical hernias in adults tend to get worse over time, and they require surgical treatment. There are several types of umbilical hernias. You may have:  A hernia located just above or below the umbilicus (indirect hernia). This is the most common type of umbilical hernia in adults.  A hernia that forms through an opening formed by the umbilicus (direct hernia).  A hernia that comes and goes (reducible hernia). A reducible hernia may be visible only when you strain, lift something heavy, or cough. This type of hernia can be pushed back into the abdomen (reduced).  A hernia that traps abdominal tissue inside the hernia (incarcerated hernia). This type of hernia cannot be reduced.  A hernia that cuts off blood flow to the tissues inside the hernia (strangulated hernia). The tissues can start to die if this happens. This type of hernia requires emergency treatment. What are the causes? An umbilical hernia happens when tissue inside the abdomen presses on a weak area of the abdominal muscles. What increases the risk? You may have a greater risk of this condition if you:  Are obese.  Have had several pregnancies.  Have a buildup of fluid inside your abdomen (ascites).  Have had surgery that weakens the abdominal muscles. What are the signs or symptoms? The main symptom of this condition is a painless bulge at or near the belly button. A reducible hernia may be visible only when you strain, lift  something heavy, or cough. Other symptoms may include:  Dull pain.  A feeling of pressure. Symptoms of a strangulated hernia may include:  Pain that gets increasingly worse.  Nausea and vomiting.  Pain when pressing on the hernia.  Skin over the hernia becoming red or purple.  Constipation.  Blood in the stool. How is this diagnosed? This condition may be diagnosed based on:  A physical exam. You may be asked to cough or strain while standing. These actions increase the pressure inside your abdomen and force the hernia through the opening in your muscles. Your health care provider may try to reduce the hernia by pressing on it.  Your symptoms and medical history. How is this treated? Surgery is the only treatment for an umbilical hernia. Surgery for a strangulated hernia is done as soon as possible. If you have a small hernia that is not incarcerated, you may need to lose weight before having surgery. Follow these instructions at home:  Lose weight, if told by your health care provider.  Do not try to push the hernia back in.  Watch your hernia for any changes in color or size. Tell your health care provider if any changes occur.  You may need to avoid activities that increase pressure on your hernia.  Do not lift anything that is heavier than 10 lb (4.5 kg) until your health care provider says that this is safe.  Take over-the-counter and prescription medicines only as told by your health care provider.  Keep all follow-up visits as told  by your health care provider. This is important. Contact a health care provider if:  Your hernia gets larger.  Your hernia becomes painful. Get help right away if:  You develop sudden, severe pain near the area of your hernia.  You have pain as well as nausea or vomiting.  You have pain and the skin over your hernia changes color.  You develop a fever. This information is not intended to replace advice given to you by your  health care provider. Make sure you discuss any questions you have with your health care provider. Document Revised: 06/24/2017 Document Reviewed: 11/10/2016 Elsevier Patient Education  [place].

## 2020-04-10 NOTE — ED Triage Notes (Signed)
Pt reports being post op c5,6,7 from nov 5th, prior to surg pt had pain to bilat shoulders and down his spine into his legs, pt reports severe rt shoulder pain since the surg without relief, states follow up appt is nov 29th

## 2020-04-10 NOTE — ED Provider Notes (Signed)
[facility] Emergency Department Provider Note  ____________________________________________  Time seen: Approximately 3:25 PM  I have reviewed the triage vital signs and the nursing notes.   HISTORY  Chief Complaint Shoulder Pain and Post-op Problem    HPI Curtis Bray. is a 59 y.o. male who presents the emergency department complaining of right shoulder pain.  Patient states that he has had sharp pain along the superior and anterior aspect of the right shoulder.  He states that it is somewhat intermittent in nature.  At its worst, it feels like there is a knife sticking into his joint.  Patient states that he had C-spine surgery on C5, C6, C7 on 03/30/2020.   This was performed by neurosurgery at [facility]  Patient states that he had no complications with his surgery.  According to the surgical note in epic, patient tolerated the procedure well.  Patient had C4-C7 cervical decompression, discectomy, anterior fusion.  Patient states that 2 days after his surgery he started to experience shoulder pain.  He has had no radiation down to his hand.  No numbness or tingling.  He states that it is a sharp sensation along the anterior aspect of the shoulder.  Patient does have a history of bursitis and states that symptoms are similar to his previous bursitis.  Patient was placed on a course of steroid a 6-day taper.  Patient has a prescription for Percocet at home but states that with the exception of severe pain, he has not been taking this medication.  Patient denies any neck pain.  No symptoms into the left side.  Patient has had no bowel or bladder dysfunction, saddle anesthesia or paresthesias.  No radiation of pain along the spine or into the lower back.  No radiation of pain into the lower extremities.        Past Medical History:  Diagnosis Date  . Allergy   . Cancer [facility])    Prostate  . Carpal tunnel syndrome   . DDD (degenerative disc disease), cervical    . DDD (degenerative disc disease), cervical   . Deafness in left ear   . Edema   . GERD (gastroesophageal reflux disease)   . Hyperlipidemia   . Personal history of colonic polyps   . Pinched nerve    upper and lower back  . Prostate cancer ([place])   . Sleep apnea    Bi-pap  . Sleep apnea   . Stroke [facility])    2013  . TIA (transient ischemic attack) 07/2011  . Umbilical hernia   . Vision impairment    Blindness Right Eye    Patient Active Problem List   Diagnosis Date Noted  . Cervical stenosis of spinal canal 03/30/2020  . Depression, major, single episode, moderate ([place]) 11/25/2019  . Osteoarthritis of spine with radiculopathy, lumbar region 11/21/2019  . Malignant neoplasm of prostate ([place]) 03/21/2019  . Stenosis, cervical spine 04/15/2017  . Lung nodule 03/20/2017  . Neck pain 03/05/2017  . Morbid obesity with BMI of 40.0-44.9, adult ([place]) 10/14/2016  . Chronic right shoulder pain 09/17/2016  . DDD (degenerative disc disease), cervical 09/17/2016  . Right carpal tunnel syndrome 09/12/2016  . Insomnia 09/13/2015  . 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 Numbness and tingling of right thumb 03/07/2015  . Paresthesia of skin 03/07/2015  . Umbilical hernia without obstruction and without gangrene 02/13/2015  . Chronic nasal congestion 02/13/2015  . Sleep apnea   . Hyperlipidemia   . GERD (gastroesophageal reflux  disease)     Past Surgical History:  Procedure Laterality Date  . ANTERIOR CERVICAL DECOMP/DISCECTOMY FUSION N/A 03/30/2020   Procedure: Cervical Four-Five/Cervical Five-Six / Cervical Six-Seven  Anterior cervical decompression/discectomy/fusion;  Surgeon: Erline Levine, MD;  Location: [place];  Service: Neurosurgery;  Laterality: N/A;  3C/RM 21  . APPENDECTOMY    . carpel tunnel Bilateral   . COLONOSCOPY  2015   Found 2 beign polyps, repeat 10 years  . COLONOSCOPY WITH PROPOFOL N/A 03/30/2019   Procedure: COLONOSCOPY WITH PROPOFOL;  Surgeon: [name], MD;  Location:  [facility] ENDOSCOPY;  Service: Gastroenterology;  Laterality: N/A;  . ESOPHAGOGASTRODUODENOSCOPY (EGD) WITH PROPOFOL N/A 03/30/2019   Procedure: ESOPHAGOGASTRODUODENOSCOPY (EGD) WITH PROPOFOL;  Surgeon: [name], MD;  Location: [facility] ENDOSCOPY;  Service: Gastroenterology;  Laterality: N/A;  . RADIOACTIVE SEED IMPLANT N/A 12/13/2018   Procedure: RADIOACTIVE SEED IMPLANT/BRACHYTHERAPY IMPLANT;  Surgeon: Billey Co, MD;  Location: [facility] ORS;  Service: Urology;  Laterality: N/A;  . TONSILLECTOMY      Prior to Admission medications   Medication Sig Start Date End Date Taking? Authorizing Provider  ASPIR-LOW 81 MG EC tablet Take 81 mg by mouth daily.  11/27/17   [provider]  buPROPion (WELLBUTRIN XL) 300 MG 24 hr tablet Take 1 tablet (300 mg total) by mouth daily. 11/25/19   Johnson, Megan P, DO  CALCIUM PO Take 1 tablet by mouth 2 (two) times a day.     [provider]  Cholecalciferol (VITAMIN D-3) 125 MCG (5000 UT) TABS Take 5,000 Units by mouth at bedtime.    [provider]  Coenzyme Q10 (COQ10) 200 MG CAPS Take 200 mg by mouth at bedtime.    [provider]  cyclobenzaprine (FLEXERIL) 10 MG tablet Take 1 tablet (10 mg total) by mouth 3 (three) times daily as needed for muscle spasms. 04/10/20   Nakeyia Menden, Charline Bills, PA-C  fluticasone (FLONASE) 50 MCG/ACT nasal spray Place 2 sprays into both nostrils daily. 11/25/19   Johnson, Megan P, DO  gabapentin (NEURONTIN) 600 MG tablet Take 600-900 mg by mouth See admin instructions. Take 600 mg by mouth in the morning and 900 mg at night 12/28/19   [provider]  ibuprofen (ADVIL) 200 MG tablet Take 800 mg by mouth every 8 (eight) hours as needed (pain.).     [provider]  meloxicam (MOBIC) 15 MG tablet Take 1 tablet (15 mg total) by mouth daily. 04/10/20   Edna Rede, Charline Bills, PA-C  methocarbamol (ROBAXIN) 500 MG tablet Take 250-500 mg by mouth 2 (two) times daily as needed for muscle spasms.   10/25/19   [provider]  predniSONE (DELTASONE) 10 MG tablet Take 1 tablet (10 mg total) by mouth daily. 04/10/20   Sable Knoles, Charline Bills, PA-C  RABEprazole (ACIPHEX) 20 MG tablet Take 2 tablets (40 mg total) by mouth daily. 11/25/19   Johnson, Megan P, DO  simvastatin (ZOCOR) 40 MG tablet Take 1 tablet (40 mg total) by mouth at bedtime. 11/25/19   Johnson, Megan P, DO  tadalafil (CIALIS) 10 MG tablet Take 1 tablet (10 mg total) by mouth daily as needed for erectile dysfunction. 10/12/19 10/06/20  Billey Co, MD  tamsulosin (FLOMAX) 0.4 MG CAPS capsule Take 2 capsules (0.8 mg total) by mouth daily. 01/10/20   Billey Co, MD  traZODone (DESYREL) 50 MG tablet Take 2 tablets (100 mg total) by mouth at bedtime. 04/29/19   Johnson, Megan P, DO  vitamin B-12 (CYANOCOBALAMIN) 1000 MCG tablet  Take 1,000 mcg by mouth at bedtime.    [provider]    Allergies Patient has no known allergies.  Family History  Problem Relation Age of Onset  . Hearing loss Father   . Hypertension Father   . Pneumonia Father   . Asthma Son   . Cancer Maternal Grandmother        Colon    Social History Social History   Tobacco Use  . Smoking status: Former Smoker    Years: 18.00    Quit date: 05/27/1995    Years since quitting: 24.8  . Smokeless tobacco: Never Used  Vaping Use  . Vaping Use: Never used  Substance Use Topics  . Alcohol use: Yes    Alcohol/week: 0.0 standard drinks    Comment: occassionally   . Drug use: No     Review of Systems  Constitutional: No fever/chills Eyes: No visual changes. No discharge ENT: No upper respiratory complaints. Cardiovascular: no chest pain. Respiratory: no cough. No SOB. Gastrointestinal: No abdominal pain.  No nausea, no vomiting.  No diarrhea.  No constipation. Genitourinary: Negative for dysuria. No hematuria Musculoskeletal: Right shoulder pain Skin: Negative for rash, abrasions, lacerations, ecchymosis. Neurological: Negative for  headaches, focal weakness or numbness.  10 System ROS otherwise negative.  ____________________________________________   PHYSICAL EXAM:  VITAL SIGNS: ED Triage Vitals  Enc Vitals Group     BP 04/10/20 1454 126/75     Pulse Rate 04/10/20 1454 81     Resp 04/10/20 1454 18     Temp 04/10/20 1454 98.2 F (36.8 C)     Temp Source 04/10/20 1454 Oral     SpO2 04/10/20 1454 96 %     Weight --      Height --      Head Circumference --      Peak Flow --      Pain Score 04/10/20 1455 3     Pain Loc --      Pain Edu? --      Excl. in [place]? --      Constitutional: Alert and oriented. Well appearing and in no acute distress. Eyes: Conjunctivae are normal. PERRL. EOMI. Head: Atraumatic. ENT:      Ears:       Nose: No congestion/rhinnorhea.      Mouth/Throat: Mucous membranes are moist.  Neck: No stridor.  Patient cervical spine and hard cervical collar.  No cervical spine tenderness to palpation.  Incision along the anterior neck from surgery with no erythema or edema concerning for infection.  Cardiovascular: Normal rate, regular rhythm. Normal S1 and S2.  Good peripheral circulation. Respiratory: Normal respiratory effort without tachypnea or retractions. Lungs CTAB. Good air entry to the bases with no decreased or absent breath sounds. Musculoskeletal: Full range of motion to all extremities. No gross deformities appreciated.  Visualization of the right shoulder reveals no visible signs of trauma.  No abrasions, ecchymosis.  Good range of motion.  Patient is tender to palpation along the right-sided superior thoracic paraspinal muscle group.  No extension over the triangle of auscultation.  No tenderness along the scapula or scapular ridge.  Mild tenderness along the superior aspect of the shoulder over the rotator cuff.  No palpable deficits in this area.  Patient does have tenderness along the [facility] joint.  No palpable findings or deficits in this region.  No tenderness over the humeral  head.  No extension of tenderness into the humerus.  Examination of the right elbow,  forearm is unremarkable.  Radial pulse intact distally.  Patient has chronic sensory changes in the right thumb which is unchanged from baseline.  Otherwise sensation intact with no deficits to the index through pinky finger.  Capillary refill intact all digits. Neurologic:  Normal speech and language. No gross focal neurologic deficits are appreciated.  Skin:  Skin is warm, dry and intact. No rash noted. Psychiatric: Mood and affect are normal. Speech and behavior are normal. Patient exhibits appropriate insight and judgement.   ____________________________________________   LABS (all labs ordered are listed, but only abnormal results are displayed)  Labs Reviewed - No data to display ____________________________________________  EKG   ____________________________________________  RADIOLOGY I personally viewed and evaluated these images as part of my medical decision making, as well as reviewing the written report by the radiologist.  ED Provider Interpretation: Foraminal outlet stenosis on CT scan, no evidence of hardware loosening or hematoma/fluid collection around the cervical spine.  X-ray of the shoulder is unremarkable  DG Shoulder Right  Result Date: 04/10/2020 CLINICAL DATA:  59 year old male with right shoulder pain for 10 days. Recent cervical spine surgery. EXAM: RIGHT SHOULDER - 2+ VIEW COMPARISON:  Right shoulder series 02/23/2020. FINDINGS: Bone mineralization is within normal limits. There is no evidence of fracture or dislocation. Stable mild degenerative spurring at the glenohumeral joint. Partially visible cervical ACDF hardware. Negative visible right ribs and chest. IMPRESSION: No acute osseous abnormality identified about the right shoulder. Electronically Signed   By: Genevie Ann M.D.   On: 04/10/2020 16:47   CT Cervical Spine Wo Contrast  Result Date: 04/10/2020 CLINICAL DATA:   Right-sided neck pain radiating to surgery postop from November 5th EXAM: CT CERVICAL SPINE WITHOUT CONTRAST TECHNIQUE: Multidetector CT imaging of the cervical spine was performed without intravenous contrast. Multiplanar CT image reconstructions were also generated. COMPARISON:  None. FINDINGS: Alignment: Physiologic Skull base and vertebrae: Visualized skull base is intact. No atlanto-occipital dissociation. The vertebral body heights are well maintained. No fracture or pathologic osseous lesion seen. Soft tissues and spinal canal: The visualized paraspinal soft tissues are unremarkable. Prevertebral tissue swelling in. No loculated fluid collections noted. The spinal canal is grossly unremarkable, no large epidural collection or significant canal narrowing. Disc levels: The patient is status post ACDF C4 through C7. No periprosthetic lucency fracture is identified. There is interbody graft seen C4 through C7. Cervical spine spondylosis seen most notable C4-C5 and C5-C6 uncovertebral osteophytes severe right neural foraminal narrowing. Upper chest: The lung apices are clear. Thoracic inlet is within normal limits. Other: None IMPRESSION: Status post ACDF from C4 through C7 without acute complication. Cervical spine spondylosis again noted most notable C4-C5 and C5-C6 with severe right neural foraminal narrowing. Mild prevertebral tissue swelling. Electronically Signed   By: Prudencio Pair M.D.   On: 04/10/2020 16:48    ____________________________________________    PROCEDURES  Procedure(s) performed:    Procedures    Medications  ketorolac (TORADOL) 30 MG/ML injection 30 mg (30 mg Intramuscular Given 04/10/20 1808)  orphenadrine (NORFLEX) injection 60 mg (60 mg Intramuscular Given 04/10/20 1808)  dexamethasone (DECADRON) injection 10 mg (10 mg Intramuscular Given 04/10/20 1808)     ____________________________________________   INITIAL IMPRESSION / ASSESSMENT AND PLAN / ED  COURSE  Pertinent labs & imaging results that were available during my care of the patient were reviewed by me and considered in my medical decision making (see chart for details).  Review of the [place] CSRS was performed in accordance of the [facility]  prior to dispensing any controlled drugs.           Patient's diagnosis is consistent with right shoulder pain, cervical radiculopathy.  Patient presented to emergency department complaining of right shoulder pain after having surgery to the cervical spine 11 days ago.  Review of medical records revealed patient had a cervical spine surgery 11 days ago at [facility].  Patient tolerated surgery well.  Patient has developed pain to the superior and anterior aspect of the shoulder.  Overall exam is reassuring.  No recent trauma.  Imaging returned with no evidence of loosening of the hardware or signs of hematoma or gross infection.  Imaging of the shoulder is unremarkable.  Differential included cervical radiculopathy, hardware complication, postsurgical infection, bursitis, impingement syndrome.  At this time no indication for labs.  We will start the patient on anti-inflammatory plus steroid plus muscle relaxer and he can continue his prescribed pain medication from surgery if needed.  Return precautions discussed with the patient and his wife.  Follow-up with neurosurgery.  Patient is given ED precautions to return to the ED for any worsening or new symptoms.     ____________________________________________  FINAL CLINICAL IMPRESSION(S) / ED DIAGNOSES  Final diagnoses:  Acute pain of right shoulder  Cervical radiculopathy      NEW MEDICATIONS STARTED DURING THIS VISIT:  ED Discharge Orders         Ordered    meloxicam (MOBIC) 15 MG tablet  Daily        04/10/20 1818    cyclobenzaprine (FLEXERIL) 10 MG tablet  3 times daily PRN        04/10/20 1818    predniSONE (DELTASONE) 10 MG tablet  Daily       Note to Pharmacy: Take 6 pills x 2 days, 5  pills x 2 days, 4 pills x 2 days, 3 pills x 2 days, 2 pills x 2 days, and 1 pill x 2 days   04/10/20 1818              This chart was dictated using voice recognition software/Dragon. Despite best efforts to proofread, errors can occur which can change the meaning. Any change was purely unintentional.    Darletta Moll, PA-C 04/10/20 Karie Fetch, MD 04/12/20 [phone]

## 2020-04-10 NOTE — Progress Notes (Signed)
Patient ID: [name], male   DOB: 06-Feb-1961, 59 y.o.   MRN: [id number]  Chief Complaint: Umbilical hernia  History of Present Illness Rylend Pietrzak. is a 59 y.o. male with known umbilical hernia present for over 2 years.  Over the last month appears to be enlarging.  Underwent recent cervical surgery 11 days ago and is currently wearing a hard collar.  He is well aware that this is created some additional stress on his core body strength, utilizing his abdominal musculature significantly more.  This is also created the knot which he is able to reduce himself.  Concerns were raised that the hernia was getting larger.  He is always been able to reduce it but has significantly increased tenderness around the region even after reduction.  He denies any inflammatory changes, erythema to the area or swelling/induration of the skin. 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  Currently dealing with some double vision in his left eye and has a pending consultation with a neuro-ophthalmologist.  Past Medical History Past Medical History:  Diagnosis Date  . Allergy   . Cancer [facility])    Prostate  . Carpal tunnel syndrome   . DDD (degenerative disc disease), cervical   . DDD (degenerative disc disease), cervical   . Deafness in left ear   . Edema   . GERD (gastroesophageal reflux disease)   . Hyperlipidemia   . Personal history of colonic polyps   . Pinched nerve    upper and lower back  . Prostate cancer ([place])   . Sleep apnea    Bi-pap  . Sleep apnea   . Stroke [facility])    2013  . TIA (transient ischemic attack) 07/2011  . Umbilical hernia   . Vision impairment    Blindness Right Eye      Past Surgical History:  Procedure Laterality Date  . ANTERIOR CERVICAL DECOMP/DISCECTOMY FUSION N/A 03/30/2020   Procedure: Cervical Four-Five/Cervical Five-Six / Cervical Six-Seven  Anterior cervical  decompression/discectomy/fusion;  Surgeon: Erline Levine, MD;  Location: [place];  Service: Neurosurgery;  Laterality: N/A;  3C/RM 21  . APPENDECTOMY    . carpel tunnel Bilateral   . COLONOSCOPY  2015   Found 2 beign polyps, repeat 10 years  . COLONOSCOPY WITH PROPOFOL N/A 03/30/2019   Procedure: COLONOSCOPY WITH PROPOFOL;  Surgeon: [name], MD;  Location: [facility] ENDOSCOPY;  Service: Gastroenterology;  Laterality: N/A;  . ESOPHAGOGASTRODUODENOSCOPY (EGD) WITH PROPOFOL N/A 03/30/2019   Procedure: ESOPHAGOGASTRODUODENOSCOPY (EGD) WITH PROPOFOL;  Surgeon: [name], MD;  Location: [facility] ENDOSCOPY;  Service: Gastroenterology;  Laterality: N/A;  . RADIOACTIVE SEED IMPLANT N/A 12/13/2018   Procedure: RADIOACTIVE SEED IMPLANT/BRACHYTHERAPY IMPLANT;  Surgeon: Billey Co, MD;  Location: [facility] ORS;  Service: Urology;  Laterality: N/A;  . TONSILLECTOMY      No Known Allergies  Current Outpatient Medications  Medication Sig Dispense Refill  . ASPIR-LOW 81 MG EC tablet Take 81 mg by mouth daily.     Marland Kitchen buPROPion (WELLBUTRIN XL) 300 MG 24 hr tablet Take 1 tablet (300 mg total) by mouth daily. 90 tablet 1  . CALCIUM PO Take 1 tablet by mouth 2 (two) times a day.     . Cholecalciferol (VITAMIN D-3) 125 MCG (5000 UT) TABS Take 5,000 Units by mouth at bedtime.    . Coenzyme Q10 (COQ10) 200 MG CAPS Take 200 mg by mouth at  bedtime.    . fluticasone (FLONASE) 50 MCG/ACT nasal spray Place 2 sprays into both nostrils daily. 48 g 3  . gabapentin (NEURONTIN) 600 MG tablet Take 600-900 mg by mouth See admin instructions. Take 600 mg by mouth in the morning and 900 mg at night    . ibuprofen (ADVIL) 200 MG tablet Take 800 mg by mouth every 8 (eight) hours as needed (pain.).     Marland Kitchen methocarbamol (ROBAXIN) 500 MG tablet Take 250-500 mg by mouth 2 (two) times daily as needed for muscle spasms.     . RABEprazole (ACIPHEX) 20 MG tablet Take 2 tablets (40 mg total) by mouth daily. 180 tablet 3  . simvastatin (ZOCOR) 40  MG tablet Take 1 tablet (40 mg total) by mouth at bedtime. 90 tablet 1  . tadalafil (CIALIS) 10 MG tablet Take 1 tablet (10 mg total) by mouth daily as needed for erectile dysfunction. 90 tablet 3  . tamsulosin (FLOMAX) 0.4 MG CAPS capsule Take 2 capsules (0.8 mg total) by mouth daily. 90 capsule 3  . traZODone (DESYREL) 50 MG tablet Take 2 tablets (100 mg total) by mouth at bedtime. 180 tablet 3  . vitamin B-12 (CYANOCOBALAMIN) 1000 MCG tablet Take 1,000 mcg by mouth at bedtime.     No current facility-administered medications for this visit.    Family History Family History  Problem Relation Age of Onset  . Hearing loss Father   . Hypertension Father   . Pneumonia Father   . Asthma Son   . Cancer Maternal Grandmother        Colon      Social History Social History   Tobacco Use  . Smoking status: Former Smoker    Years: 18.00    Quit date: 05/27/1995    Years since quitting: 24.8  . Smokeless tobacco: Never Used  Vaping Use  . Vaping Use: Never used  Substance Use Topics  . Alcohol use: Yes    Alcohol/week: 0.0 standard drinks    Comment: occassionally   . Drug use: No        Review of Systems  Constitutional: Negative.   HENT: Positive for hearing loss and tinnitus.   Eyes: Positive for double vision.  Respiratory: Positive for shortness of breath.   Cardiovascular: Negative.   Gastrointestinal: Positive for blood in stool. Negative for constipation.  Genitourinary: Positive for urgency.  Skin: Negative.   Neurological: Negative.   Psychiatric/Behavioral: Positive for depression.      Physical Exam Blood pressure 137/80, pulse 80, temperature 98.5 F (36.9 C), temperature source Oral, height 5' 10.5" (1.791 m), weight 275 lb 12.8 oz (125.1 kg), SpO2 95 %. Last Weight  Most recent update: 04/10/2020  1:31 PM   Weight  125.1 kg (275 lb 12.8 oz)            CONSTITUTIONAL: Well developed, and nourished, appropriately responsive and aware without distress.   Nearly morbidly obese male. EYES: Sclera non-icteric.   EARS, NOSE, MOUTH AND THROAT: Mask worn.   Hearing is intact to voice.  NECK: Trachea is midline, and there is no jugular venous distension.  LYMPH NODES:  Lymph nodes in the neck are not enlarged. RESPIRATORY:  Lungs are clear, and breath sounds are equal bilaterally. Normal respiratory effort without pathologic use of accessory muscles. CARDIOVASCULAR: Heart is regular in rate and rhythm. GI: The abdomen is soft, nontender, and nondistended.  Centimeter sized umbilical fascial defect with minimal overlying hernia sac.  Easily reduced and nontender.  There were no palpable masses. I did not appreciate hepatosplenomegaly. There were normal bowel sounds. MUSCULOSKELETAL:  Symmetrical muscle tone appreciated in all four extremities.    SKIN: Skin turgor is normal. No pathologic skin lesions appreciated.  NEUROLOGIC:  Motor and sensation appear grossly normal.  Cranial nerves are grossly without defect. PSYCH:  Alert and oriented to person, place and time. Affect is appropriate for situation.  Data Reviewed I have personally reviewed what is currently available of the patient's imaging, recent labs and medical records.   Labs:  CBC Latest Ref Rng & Units 03/27/2020 11/25/2019 04/29/2019  WBC 4.0 - 10.5 K/uL 6.4 5.1 5.7  Hemoglobin 13.0 - 17.0 g/dL 13.6 13.3 14.6  Hematocrit 39 - 52 % 42.4 41.3 43.4  Platelets 150 - 400 K/uL 186 207 191   CMP Latest Ref Rng & Units 11/25/2019 04/29/2019 04/26/2018  Glucose 65 - 99 mg/dL 129(H) 105(H) 84  BUN 6 - 24 mg/dL 11 15 16   Creatinine 0.76 - 1.27 mg/dL 1.08 1.00 1.22  Sodium 134 - 144 mmol/L 141 141 140  Potassium 3.5 - 5.2 mmol/L 4.3 4.5 4.4  Chloride 96 - 106 mmol/L 103 104 102  CO2 20 - 29 mmol/L 25 24 22   Calcium 8.7 - 10.2 mg/dL 9.5 9.8 9.2  Total Protein 6.0 - 8.5 g/dL 6.7 6.3 6.5  Total Bilirubin 0.0 - 1.2 mg/dL 0.3 0.4 0.8  Alkaline Phos 48 - 121 IU/L 99 97 80  AST 0 - 40 IU/L 16 21 40   ALT 0 - 44 IU/L 24 30 90(H)      Imaging:  Within last 24 hrs: No results found.  Assessment     Patient Active Problem List   Diagnosis Date Noted  . Cervical stenosis of spinal canal 03/30/2020  . Depression, major, single episode, moderate ([place]) 11/25/2019  . Osteoarthritis of spine with radiculopathy, lumbar region 11/21/2019  . Malignant neoplasm of prostate ([place]) 03/21/2019  . Stenosis, cervical spine 04/15/2017  . Lung nodule 03/20/2017  . Neck pain 03/05/2017  . Morbid obesity with BMI of 40.0-44.9, adult ([place]) 10/14/2016  . Chronic right shoulder pain 09/17/2016  . DDD (degenerative disc disease), cervical 09/17/2016  . Right carpal tunnel syndrome 09/12/2016  . Insomnia 09/13/2015  . 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 Numbness and tingling of right thumb 03/07/2015  . Paresthesia of skin 03/07/2015  . Umbilical hernia without obstruction and without gangrene 02/13/2015  . Chronic nasal congestion 02/13/2015  . Sleep apnea   . Hyperlipidemia   . GERD (gastroesophageal reflux disease)     Plan    Considering his coexisting conditions and his current recovery from his neck surgery, I think it is prudent to defer elective repair.  I explained to him the issues that would make consultations/repair more emergent/urgent and I believe he is well aware and understands. We discussed the various approaches to umbilical hernia repair with and without mesh.  Considering the small size it would be ideal to do a primary suture repair after minimizing the degree of abdominal wall tension.  Therefore I have encouraged him to pursue some weight loss and we can gather to reevaluate elective repair.  The alternative is should it become more symptomatic we can always get together to consider more urgent repair and likely utilize some mesh reinforcement. I believe he understands and would like to address his other medical conditions including his vision on a more urgent basis.  It is always  unfortunate that the deductible recycling  time comes at the beginning of the year, and this creates certain stresses around the holidays.  Face-to-face time spent with the patient and accompanying care providers(if present) was 30 minutes, with more than 50% of the time spent counseling, educating, and coordinating care of the patient.      Ronny Bacon M.D., FACS 04/10/2020, 1:58 PM

## 2020-04-18 ENCOUNTER — Telehealth: Payer: Self-pay

## 2020-04-18 NOTE — Telephone Encounter (Signed)
Placard form completed and signed for patient. Called and LVM letting him know that this was ready for him. Advised in VM that the office would be closed the rest of the week for the holiday after 12 noon today.

## 2020-04-23 ENCOUNTER — Other Ambulatory Visit: Payer: Self-pay | Admitting: Family Medicine

## 2020-04-23 NOTE — Telephone Encounter (Signed)
Requested medication (s) are due for refill today: yes  Requested medication (s) are on the active medication list: yes  Last refill:  last filled by historical provider  Future visit scheduled: no  Notes to clinic:  Please review for refill. Last filled by historical provider    Requested Prescriptions  Pending Prescriptions Disp Refills   ASPIRIN LOW DOSE 81 MG EC tablet [Pharmacy Med Name: ASPIRIN LOW DOSE 81 MG DR TAB] 90 tablet     Sig: TAKE 1 TABLET BY MOUTH ONCE DAILY      Analgesics:  NSAIDS - aspirin Passed - 04/23/2020  5:38 PM      Passed - Patient is not pregnant      Passed - Valid encounter within last 12 months    Recent Outpatient Visits           3 weeks ago Umbilical hernia without obstruction and without gangrene   [place], Megan P, DO   3 months ago Depression, major, single episode, moderate ([place])   [place], Megan P, DO   5 months ago Routine general medical examination at a health care facility   [facility], [place] P, DO   5 months ago Neck pain   [place], [place], DO   10 months ago Adjustment disorder with mixed anxiety and depressed mood   [place], [place], DO       Future Appointments             In 2 months [place], Herbert Seta, [place]

## 2020-05-02 ENCOUNTER — Other Ambulatory Visit: Payer: Self-pay | Admitting: Neurosurgery

## 2020-05-02 DIAGNOSIS — M5412 Radiculopathy, cervical region: Secondary | ICD-10-CM

## 2020-05-05 ENCOUNTER — Ambulatory Visit
Admission: RE | Admit: 2020-05-05 | Discharge: 2020-05-05 | Disposition: A | Discharge status: Home / Self Care | Payer: BC Managed Care – PPO | Source: Ambulatory Visit | Attending: Neurosurgery | Admitting: Neurosurgery

## 2020-05-05 ENCOUNTER — Other Ambulatory Visit: Payer: Self-pay

## 2020-05-05 DIAGNOSIS — M5412 Radiculopathy, cervical region: Secondary | ICD-10-CM | POA: Insufficient documentation

## 2020-05-07 ENCOUNTER — Other Ambulatory Visit: Payer: Self-pay | Admitting: Ophthalmology

## 2020-05-07 DIAGNOSIS — H547 Unspecified visual loss: Secondary | ICD-10-CM

## 2020-05-08 ENCOUNTER — Ambulatory Visit
Admission: RE | Admit: 2020-05-08 | Discharge: 2020-05-08 | Disposition: A | Discharge status: Home / Self Care | Payer: BC Managed Care – PPO | Source: Ambulatory Visit | Attending: Ophthalmology | Admitting: Ophthalmology

## 2020-05-08 ENCOUNTER — Other Ambulatory Visit
Admission: RE | Admit: 2020-05-08 | Discharge: 2020-05-08 | Disposition: A | Discharge status: Home / Self Care | Payer: BC Managed Care – PPO | Source: Ambulatory Visit | Attending: Neurosurgery | Admitting: Neurosurgery

## 2020-05-08 ENCOUNTER — Other Ambulatory Visit: Payer: Self-pay

## 2020-05-08 ENCOUNTER — Ambulatory Visit: Admission: RE | Admit: 2020-05-08 | Payer: BC Managed Care – PPO | Source: Ambulatory Visit

## 2020-05-08 DIAGNOSIS — H547 Unspecified visual loss: Secondary | ICD-10-CM

## 2020-05-08 DIAGNOSIS — Z20822 Contact with and (suspected) exposure to covid-19: Secondary | ICD-10-CM | POA: Diagnosis not present

## 2020-05-08 DIAGNOSIS — Z01812 Encounter for preprocedural laboratory examination: Secondary | ICD-10-CM | POA: Diagnosis present

## 2020-05-08 LAB — SARS CORONAVIRUS 2 (TAT 6-24 HRS): SARS Coronavirus 2: NEGATIVE

## 2020-05-08 MED ORDER — GADOBUTROL 1 MMOL/ML IV SOLN
10.0000 mL | Freq: Once | INTRAVENOUS | Status: AC | PRN
  Administered 2020-05-08: 15:00:00 10 mL via INTRAVENOUS

## 2020-05-08 NOTE — H&P (Signed)
Patient ID:   [id number] Patient: Curtis Bray  Date of Birth: [birth year] Visit Type: Office Visit   Date: 04/30/2020 04:00 PM Provider: Marchia Meiers. Vertell Limber MD   This 59 year old male presents for neck pain.  HISTORY OF PRESENT ILLNESS: 1.  neck pain  Patient returns as scheduled to review MRI and discuss treatment options for debilitating right upper extremity pain.  Unfortunately, MRI has not yet been approved by insurance.  03/30/2020 C4-5, C5-6, C6-7 ACDF  Exam today is somewhat limited by patient's excruciating right upper extremity pain.  He does not appear to have weakness, but has to cradle his right arm against his chest due to severe right shoulder pain.      Medical/Surgical/Interim History Reviewed, no change.  Last detailed document date:02/23/2020.     Family History: Reviewed, no changes.  Last detailed document date:02/23/2020.   Social History: Reviewed, no changes. Last detailed document date: 02/23/2020.    MEDICATIONS: (added, continued or stopped this visit) Started Medication Directions Instruction Stopped  aspirin take 1 tablet by oral route  every day   04/30/2020 cyclobenzaprine 10 mg tablet take 1 tablet by oral route up to tid prn spasm   04/02/2020 Medrol (Pak) 4 mg tablets in a dose pack Use as directed   04/02/2020 oxycodone 5 mg tablet take 1-2 tablet(s) by oral route  every 6 hours as needed for pain   04/23/2020 tramadol 50 mg tablet take 1 tablet by oral route  every 6 hours as needed      ALLERGIES: Ingredient Reaction Medication Name Comment NO KNOWN ALLERGIES    No known allergies. Reviewed, no changes.    PHYSICAL EXAM:  Vitals Date Temp F BP Pulse Ht In Wt Lb BMI BSA Pain Score 04/30/2020  144/92 82 70.5 288 40.74  9/10    DIAGNOSTIC RESULTS:  Currently awaiting MRI approval.  Again, CT scan of the cervical spine obtained in the emergency department demonstrates right  foraminal stenosis at the C4-5 and C5-6 levels.    IMPRESSION:  Khristopher Kapaun returns today in considerable pain.  He demonstrates no position of comfort, cradling his right arm against his chest to minimize pain exacerbation.  PLAN: Tramadol has offered no pain relief.  We need to evaluate a cervical MRI to rule out cord injury.  This should be completed on expedited basis.  Will schedule right C4-5, C5-6 foraminotomies.  Orders: Instruction(s)/Education: Assessment Instruction R03.0 Lifestyle education Z68.41 Dietary management education, guidance, and counseling  Completed Orders (this encounter) Order Details Reason Side Interpretation Result Initial Treatment Date Region Lifestyle education Patient will monitor and contact primary care physician if needed.       Dietary management education, guidance, and counseling Encouraged patient to eat well balanced diet.        Assessment/Plan  # Detail Type Description  1. Assessment Foraminal stenosis of cervical region (M48.02).     2. Assessment Cervical radiculopathy (M54.12).     3. Assessment Elevated blood-pressure reading, w/o diagnosis of htn (R03.0).     4. Assessment Body mass index (BMI) 40.0-44.9, adult (Z68.41).  Plan Orders Today's instructions / counseling include(s) Dietary management education, guidance, and counseling. Clinical information/comments: Encouraged patient to eat well balanced diet.       Pain Management Plan Pain Scale: 9/10. Method: Numeric Pain Intensity Scale. Location: neck. Onset: 04/30/2020. Duration: varies. Quality: discomforting. Pain management follow-up plan of care: Patient will continue medication management.Marland Kitchen     MEDICATIONS PRESCRIBED TODAY  Rx Quantity Refills CYCLOBENZAPRINE HCL 10 mg  60 0           Provider:  Marchia Meiers. Vertell Limber MD  05/02/2020 10:44 AM    Dictation edited by: Mike Craze. [place] RN    CC  Providers: Erline Levine MD  [address] [place], [place] [address]               Electronically signed by Marchia Meiers. Vertell Limber MD on 05/03/2020 12:20 PM Patient ID:   [id number] Patient: Merilyn Baba  Date of Birth: January 01, 1961 Visit Type: Office Visit   Date: 04/23/2020 04:00 PM Provider: Marchia Meiers. Vertell Limber MD   This 59 year old male presents for neck pain.  HISTORY OF PRESENT ILLNESS: 1.  neck pain  The patient underwent anterior cervical decompression and fusion at the C4 through C7 levels on 03/30/20.  He said he did well for the 1st 2 days but then developed right shoulder pain into his right arm.  He says that this has been steady since then.  He went to the emergency room because of this right arm pain and they did a CT scan of his cervical spine which showed right foraminal stenosis at the C5-6 level.  He is complaining of pain radiating into his right shoulder and right arm.  On examination the patient has full strength in bilateral deltoid, biceps, triceps.  He has no hyper reflexia  He was treated with steroids through the emergency room and he said this helped him some but is not been lasting.  He has also been using gabapentin 900 mg and meloxicam and Flexeril.  He currently describes his pain level as 3/10.  He has to hold his right arm in order for it not to hurt.      Medical/Surgical/Interim History Reviewed, no change.  Last detailed document date:02/23/2020.     PAST MEDICAL HISTORY, SURGICAL HISTORY, FAMILY HISTORY, SOCIAL HISTORY AND REVIEW OF SYSTEMS I have reviewed the patient's past medical, surgical, family and social history as well as the comprehensive review of systems as included on the [facility] history form dated 02/23/2020, which I have signed.  Family History: Reviewed, no changes.  Last detailed document date:02/23/2020.   Social History: Reviewed, no changes. Last detailed document date:  02/23/2020.    MEDICATIONS: (added, continued or stopped this visit) Started Medication Directions Instruction Stopped  aspirin take 1 tablet by oral route  every day   04/02/2020 Medrol (Pak) 4 mg tablets in a dose pack Use as directed   04/02/2020 oxycodone 5 mg tablet take 1-2 tablet(s) by oral route  every 6 hours as needed for pain   04/23/2020 tramadol 50 mg tablet take 1 tablet by oral route  every 6 hours as needed      ALLERGIES: Ingredient Reaction Medication Name Comment NO KNOWN ALLERGIES    No known allergies. Reviewed, no changes.    PHYSICAL EXAM:  Vitals Date Temp F BP Pulse Ht In Wt Lb BMI BSA Pain Score 04/23/2020  145/70 80 70.5 287 40.6  3/10     IMPRESSION:  The patient is having right arm pain following neck surgery.  Hardware appears to be well-positioned in his cervical spine but he does have some persistent stenosis at the C4-5 and C5-6 levels, much worse at C5-6 level.  PLAN: I have given the patient a prescription of tramadol and have advised him to see how he does over this week.  If he has not  improved then I think we will need to go ahead with a cervical MRI scan.  If this is unremarkable other than the persistent foraminal stenosis, then I think he may require foraminotomies on the right at the C4-5 and C5-6 levels.  He will call if he is not improving with rest and medication   Assessment/Plan  # Detail Type Description  1. Assessment Cervicalgia (M54.2).     2. 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     3. Assessment Cervical radiculopathy (M54.12).     4. Assessment Spinal stenosis, cervical region (M48.02).          MEDICATIONS PRESCRIBED TODAY    Rx Quantity Refills TRAMADOL HCL 50 mg  60 0           Provider:  Marchia Meiers. Vertell Limber MD  04/27/2020 03:35 PM    Dictation edited by: Marchia Meiers. Vertell Limber    CC Providers: Erline Levine MD   [address] [place], [place] [address]               Electronically signed by Marchia Meiers. Vertell Limber MD on 04/27/2020 03:35 PM

## 2020-05-10 ENCOUNTER — Other Ambulatory Visit: Payer: Self-pay

## 2020-05-10 ENCOUNTER — Encounter (HOSPITAL_COMMUNITY): Payer: Self-pay | Admitting: Neurosurgery

## 2020-05-10 MED ORDER — DEXTROSE 5 % IV SOLN
3.0000 g | INTRAVENOUS | Status: DC
  Filled 2020-05-10: qty 3000

## 2020-05-10 NOTE — Progress Notes (Signed)
Mr. Curtis Bray denies chest pain or shortness of breath. Patient tested negative for Covid 05/08/20 and has been in quarantine since that time.  [name] has a CPAP, I asked patient to bring the mask with him.

## 2020-05-11 ENCOUNTER — Observation Stay (HOSPITAL_COMMUNITY)
Admission: RE | Admit: 2020-05-11 | Discharge: 2020-05-12 | Disposition: A | Discharge status: Home / Self Care | Payer: BC Managed Care – PPO | Attending: Neurosurgery | Admitting: Neurosurgery

## 2020-05-11 ENCOUNTER — Ambulatory Visit (HOSPITAL_COMMUNITY): Payer: BC Managed Care – PPO | Admitting: Certified Registered Nurse Anesthetist

## 2020-05-11 ENCOUNTER — Encounter (HOSPITAL_COMMUNITY): Payer: Self-pay | Admitting: Neurosurgery

## 2020-05-11 ENCOUNTER — Ambulatory Visit (HOSPITAL_COMMUNITY)
Admission: RE | Disposition: A | Discharge status: Home / Self Care | Payer: Self-pay | Source: Home / Self Care | Attending: Neurosurgery

## 2020-05-11 ENCOUNTER — Ambulatory Visit (HOSPITAL_COMMUNITY): Payer: BC Managed Care – PPO

## 2020-05-11 DIAGNOSIS — M5412 Radiculopathy, cervical region: Secondary | ICD-10-CM | POA: Diagnosis not present

## 2020-05-11 DIAGNOSIS — M4802 Spinal stenosis, cervical region: Principal | ICD-10-CM | POA: Diagnosis present

## 2020-05-11 DIAGNOSIS — Z7982 Long term (current) use of aspirin: Secondary | ICD-10-CM | POA: Diagnosis not present

## 2020-05-11 DIAGNOSIS — Z419 Encounter for procedure for purposes other than remedying health state, unspecified: Secondary | ICD-10-CM

## 2020-05-11 HISTORY — PX: POSTERIOR CERVICAL LAMINECTOMY WITH MET- RX: SHX6035

## 2020-05-11 HISTORY — DX: Dyspnea, unspecified: R06.00

## 2020-05-11 HISTORY — DX: Personal history of urinary calculi: Z87.442

## 2020-05-11 LAB — BASIC METABOLIC PANEL
Anion gap: 9 (ref 5–15)
BUN: 8 mg/dL (ref 6–20)
CO2: 23 mmol/L (ref 22–32)
Calcium: 8.8 mg/dL — ABNORMAL LOW (ref 8.9–10.3)
Chloride: 107 mmol/L (ref 98–111)
Creatinine, Ser: 1.07 mg/dL (ref 0.61–1.24)
GFR, Estimated: >60 mL/min (ref >60)
Glucose, Bld: 119 mg/dL — ABNORMAL HIGH (ref 70–99)
Potassium: 4 mmol/L (ref 3.5–5.1)
Sodium: 139 mmol/L (ref 135–145)

## 2020-05-11 LAB — CBC
HCT: 44.6 % (ref 39.0–52.0)
Hemoglobin: 14 g/dL (ref 13.0–17.0)
MCH: 28.9 pg (ref 26.0–34.0)
MCHC: 31.4 g/dL (ref 30.0–36.0)
MCV: 92 fL (ref 80.0–100.0)
Platelets: 197 10*3/uL (ref 150–400)
RBC: 4.85 MIL/uL (ref 4.22–5.81)
RDW: 13.1 % (ref 11.5–15.5)
WBC: 4.6 10*3/uL (ref 4.0–10.5)
nRBC: 0 % (ref 0.0–0.2)

## 2020-05-11 SURGERY — POSTERIOR CERVICAL LAMINECTOMY WITH MET- RX
Anesthesia: General | Site: Spine Cervical | Laterality: Right

## 2020-05-11 MED ORDER — MIDAZOLAM HCL 5 MG/5ML IJ SOLN
INTRAMUSCULAR | Status: DC | PRN
  Administered 2020-05-11: 2 mg via INTRAVENOUS

## 2020-05-11 MED ORDER — GABAPENTIN 600 MG PO TABS: 600.0000 mg | ORAL_TABLET | ORAL | Status: DC

## 2020-05-11 MED ORDER — METHOCARBAMOL 1000 MG/10ML IJ SOLN
500.0000 mg | Freq: Four times a day (QID) | INTRAVENOUS | Status: DC | PRN
  Filled 2020-05-11: qty 5

## 2020-05-11 MED ORDER — SUCCINYLCHOLINE CHLORIDE 20 MG/ML IJ SOLN
INTRAMUSCULAR | Status: DC | PRN
  Administered 2020-05-11: 140 mg via INTRAVENOUS

## 2020-05-11 MED ORDER — DEXAMETHASONE SODIUM PHOSPHATE 10 MG/ML IJ SOLN
INTRAMUSCULAR | Status: DC | PRN
  Administered 2020-05-11: 10 mg via INTRAVENOUS

## 2020-05-11 MED ORDER — DEXAMETHASONE SODIUM PHOSPHATE 4 MG/ML IJ SOLN
4.0000 mg | Freq: Four times a day (QID) | INTRAMUSCULAR | Status: DC
  Administered 2020-05-11: 21:00:00 4 mg via INTRAVENOUS
  Filled 2020-05-11: qty 1

## 2020-05-11 MED ORDER — ONDANSETRON HCL 4 MG PO TABS: 4.0000 mg | ORAL_TABLET | Freq: Four times a day (QID) | ORAL | Status: DC | PRN

## 2020-05-11 MED ORDER — BISACODYL 10 MG RE SUPP: 10.0000 mg | Freq: Every day | RECTAL | Status: DC | PRN

## 2020-05-11 MED ORDER — BUPIVACAINE HCL (PF) 0.5 % IJ SOLN
INTRAMUSCULAR | Status: AC
  Filled 2020-05-11: qty 30

## 2020-05-11 MED ORDER — HYDROMORPHONE HCL 1 MG/ML IJ SOLN
0.2500 mg | INTRAMUSCULAR | Status: DC | PRN
Start: 2020-05-11 — End: 2020-05-11

## 2020-05-11 MED ORDER — FLEET ENEMA 7-19 GM/118ML RE ENEM: 1.0000 | ENEMA | Freq: Once | RECTAL | Status: DC | PRN

## 2020-05-11 MED ORDER — HYDROMORPHONE HCL 1 MG/ML IJ SOLN
INTRAMUSCULAR | Status: AC
  Filled 2020-05-11: qty 0.5

## 2020-05-11 MED ORDER — PROPOFOL 10 MG/ML IV BOLUS
INTRAVENOUS | Status: DC | PRN
  Administered 2020-05-11: 50 mg via INTRAVENOUS
  Administered 2020-05-11: 150 mg via INTRAVENOUS

## 2020-05-11 MED ORDER — SIMVASTATIN 20 MG PO TABS
40.0000 mg | ORAL_TABLET | Freq: Every day | ORAL | Status: DC
  Administered 2020-05-11: 21:00:00 40 mg via ORAL
  Filled 2020-05-11: qty 2

## 2020-05-11 MED ORDER — MELOXICAM 7.5 MG PO TABS: 15.0000 mg | ORAL_TABLET | Freq: Every day | ORAL | Status: DC

## 2020-05-11 MED ORDER — LACTATED RINGERS IV SOLN: INTRAVENOUS | Status: DC

## 2020-05-11 MED ORDER — TRAZODONE HCL 100 MG PO TABS
100.0000 mg | ORAL_TABLET | Freq: Every day | ORAL | Status: DC
  Administered 2020-05-11: 22:00:00 100 mg via ORAL
  Filled 2020-05-11: qty 1

## 2020-05-11 MED ORDER — CYCLOBENZAPRINE HCL 10 MG PO TABS: 10.0000 mg | ORAL_TABLET | Freq: Three times a day (TID) | ORAL | Status: DC | PRN

## 2020-05-11 MED ORDER — METHOCARBAMOL 500 MG PO TABS
500.0000 mg | ORAL_TABLET | Freq: Four times a day (QID) | ORAL | Status: DC | PRN
  Administered 2020-05-12: 03:00:00 500 mg via ORAL
  Filled 2020-05-11 (×3): qty 1

## 2020-05-11 MED ORDER — ROCURONIUM BROMIDE 10 MG/ML (PF) SYRINGE
PREFILLED_SYRINGE | INTRAVENOUS | Status: AC
  Filled 2020-05-11: qty 10

## 2020-05-11 MED ORDER — PROPOFOL 10 MG/ML IV BOLUS
INTRAVENOUS | Status: AC
  Filled 2020-05-11: qty 20

## 2020-05-11 MED ORDER — LIDOCAINE 2% (20 MG/ML) 5 ML SYRINGE
INTRAMUSCULAR | Status: DC | PRN
  Administered 2020-05-11: 100 mg via INTRAVENOUS

## 2020-05-11 MED ORDER — DOCUSATE SODIUM 100 MG PO CAPS
100.0000 mg | ORAL_CAPSULE | Freq: Two times a day (BID) | ORAL | Status: DC
  Administered 2020-05-11: 21:00:00 100 mg via ORAL
  Filled 2020-05-11: qty 1

## 2020-05-11 MED ORDER — ALUM & MAG HYDROXIDE-SIMETH 200-200-20 MG/5ML PO SUSP: 30.0000 mL | Freq: Four times a day (QID) | ORAL | Status: DC | PRN

## 2020-05-11 MED ORDER — SODIUM CHLORIDE 0.9 % IV SOLN: 250.0000 mL | INTRAVENOUS | Status: DC

## 2020-05-11 MED ORDER — FENTANYL CITRATE (PF) 250 MCG/5ML IJ SOLN
INTRAMUSCULAR | Status: DC | PRN
  Administered 2020-05-11: 100 ug via INTRAVENOUS
  Administered 2020-05-11 (×3): 50 ug via INTRAVENOUS
  Administered 2020-05-11: 100 ug via INTRAVENOUS

## 2020-05-11 MED ORDER — ROCURONIUM BROMIDE 10 MG/ML (PF) SYRINGE
PREFILLED_SYRINGE | INTRAVENOUS | Status: AC
  Filled 2020-05-11: qty 20

## 2020-05-11 MED ORDER — ONDANSETRON HCL 4 MG/2ML IJ SOLN: 4.0000 mg | Freq: Once | INTRAMUSCULAR | Status: DC | PRN

## 2020-05-11 MED ORDER — TRAMADOL HCL 50 MG PO TABS: 50.0000 mg | ORAL_TABLET | Freq: Four times a day (QID) | ORAL | Status: DC | PRN

## 2020-05-11 MED ORDER — SUGAMMADEX SODIUM 200 MG/2ML IV SOLN
INTRAVENOUS | Status: DC | PRN
  Administered 2020-05-11: 400 mg via INTRAVENOUS

## 2020-05-11 MED ORDER — SODIUM CHLORIDE 0.9% FLUSH: 3.0000 mL | Freq: Two times a day (BID) | INTRAVENOUS | Status: DC

## 2020-05-11 MED ORDER — CHLORHEXIDINE GLUCONATE CLOTH 2 % EX PADS: 6.0000 | MEDICATED_PAD | Freq: Once | CUTANEOUS | Status: DC

## 2020-05-11 MED ORDER — 0.9 % SODIUM CHLORIDE (POUR BTL) OPTIME
TOPICAL | Status: DC | PRN
  Administered 2020-05-11: 17:00:00 1000 mL

## 2020-05-11 MED ORDER — PHENOL 1.4 % MT LIQD: 1.0000 | OROMUCOSAL | Status: DC | PRN

## 2020-05-11 MED ORDER — ZOLPIDEM TARTRATE 5 MG PO TABS: 5.0000 mg | ORAL_TABLET | Freq: Every evening | ORAL | Status: DC | PRN

## 2020-05-11 MED ORDER — GABAPENTIN 300 MG PO CAPS
900.0000 mg | ORAL_CAPSULE | Freq: Every day | ORAL | Status: DC
  Administered 2020-05-11: 21:00:00 900 mg via ORAL
  Filled 2020-05-11: qty 9

## 2020-05-11 MED ORDER — KCL IN DEXTROSE-NACL 20-5-0.45 MEQ/L-%-% IV SOLN: INTRAVENOUS | Status: DC

## 2020-05-11 MED ORDER — MIDAZOLAM HCL 2 MG/2ML IJ SOLN
INTRAMUSCULAR | Status: AC
  Filled 2020-05-11: qty 2

## 2020-05-11 MED ORDER — CHLORHEXIDINE GLUCONATE 0.12 % MT SOLN
OROMUCOSAL | Status: AC
  Administered 2020-05-11: 12:00:00 15 mL via OROMUCOSAL
  Filled 2020-05-11: qty 15

## 2020-05-11 MED ORDER — OXYCODONE HCL 5 MG PO TABS
5.0000 mg | ORAL_TABLET | ORAL | Status: DC | PRN
  Administered 2020-05-11 – 2020-05-12 (×4): 10 mg via ORAL
  Filled 2020-05-11 (×4): qty 2

## 2020-05-11 MED ORDER — THROMBIN 5000 UNITS EX SOLR
CUTANEOUS | Status: AC
  Filled 2020-05-11: qty 5000

## 2020-05-11 MED ORDER — METHOCARBAMOL 500 MG PO TABS: 500.0000 mg | ORAL_TABLET | Freq: Two times a day (BID) | ORAL | Status: DC | PRN

## 2020-05-11 MED ORDER — LIDOCAINE 2% (20 MG/ML) 5 ML SYRINGE
INTRAMUSCULAR | Status: AC
  Filled 2020-05-11: qty 10

## 2020-05-11 MED ORDER — FENTANYL CITRATE (PF) 250 MCG/5ML IJ SOLN
INTRAMUSCULAR | Status: AC
  Filled 2020-05-11: qty 5

## 2020-05-11 MED ORDER — LIDOCAINE-EPINEPHRINE 1 %-1:100000 IJ SOLN
INTRAMUSCULAR | Status: AC
  Filled 2020-05-11: qty 1

## 2020-05-11 MED ORDER — ONDANSETRON HCL 4 MG/2ML IJ SOLN
INTRAMUSCULAR | Status: DC | PRN
  Administered 2020-05-11: 4 mg via INTRAVENOUS

## 2020-05-11 MED ORDER — KETOROLAC TROMETHAMINE 15 MG/ML IJ SOLN
15.0000 mg | Freq: Four times a day (QID) | INTRAMUSCULAR | Status: DC
  Administered 2020-05-11 – 2020-05-12 (×3): 15 mg via INTRAVENOUS
  Filled 2020-05-11 (×3): qty 1

## 2020-05-11 MED ORDER — GABAPENTIN 300 MG PO CAPS
600.0000 mg | ORAL_CAPSULE | Freq: Every day | ORAL | Status: DC
Start: 2020-05-12 — End: 2020-05-12

## 2020-05-11 MED ORDER — DEXAMETHASONE 4 MG PO TABS
4.0000 mg | ORAL_TABLET | Freq: Four times a day (QID) | ORAL | Status: DC
  Administered 2020-05-12 (×2): 4 mg via ORAL
  Filled 2020-05-11 (×2): qty 1

## 2020-05-11 MED ORDER — LIDOCAINE-EPINEPHRINE 1 %-1:100000 IJ SOLN
INTRAMUSCULAR | Status: DC | PRN
  Administered 2020-05-11: 16:00:00 10 mL

## 2020-05-11 MED ORDER — COQ10 200 MG PO CAPS: 200.0000 mg | ORAL_CAPSULE | Freq: Every day | ORAL | Status: DC

## 2020-05-11 MED ORDER — ACETAMINOPHEN 10 MG/ML IV SOLN
1000.0000 mg | Freq: Once | INTRAVENOUS | Status: DC | PRN
Start: 2020-05-11 — End: 2020-05-11

## 2020-05-11 MED ORDER — FLUTICASONE PROPIONATE 50 MCG/ACT NA SUSP: 2.0000 | Freq: Every day | NASAL | Status: DC | PRN

## 2020-05-11 MED ORDER — PANTOPRAZOLE SODIUM 40 MG PO TBEC: 40.0000 mg | DELAYED_RELEASE_TABLET | Freq: Every day | ORAL | Status: DC

## 2020-05-11 MED ORDER — TAMSULOSIN HCL 0.4 MG PO CAPS
0.8000 mg | ORAL_CAPSULE | Freq: Every day | ORAL | Status: DC
  Administered 2020-05-11: 21:00:00 0.8 mg via ORAL
  Filled 2020-05-11: qty 2

## 2020-05-11 MED ORDER — GABAPENTIN 300 MG PO CAPS: 300.0000 mg | ORAL_CAPSULE | Freq: Three times a day (TID) | ORAL | Status: DC

## 2020-05-11 MED ORDER — BACITRACIN ZINC 500 UNIT/GM EX OINT
TOPICAL_OINTMENT | CUTANEOUS | Status: AC
  Filled 2020-05-11: qty 28.35

## 2020-05-11 MED ORDER — CHLORHEXIDINE GLUCONATE 0.12 % MT SOLN: 15.0000 mL | Freq: Once | OROMUCOSAL | Status: AC

## 2020-05-11 MED ORDER — PANTOPRAZOLE SODIUM 40 MG IV SOLR
40.0000 mg | Freq: Every day | INTRAVENOUS | Status: DC
Start: 2020-05-11 — End: 2020-05-11

## 2020-05-11 MED ORDER — ORAL CARE MOUTH RINSE: 15.0000 mL | Freq: Once | OROMUCOSAL | Status: AC

## 2020-05-11 MED ORDER — ACETAMINOPHEN 650 MG RE SUPP: 650.0000 mg | RECTAL | Status: DC | PRN

## 2020-05-11 MED ORDER — DEXTROSE 5 % IV SOLN
INTRAVENOUS | Status: DC | PRN
  Administered 2020-05-11: 16:00:00 3 g via INTRAVENOUS

## 2020-05-11 MED ORDER — MENTHOL 3 MG MT LOZG
1.0000 | LOZENGE | OROMUCOSAL | Status: DC | PRN
Start: 2020-05-11 — End: 2020-05-12

## 2020-05-11 MED ORDER — HYDROMORPHONE HCL 1 MG/ML IJ SOLN
0.5000 mg | INTRAMUSCULAR | Status: DC | PRN
  Administered 2020-05-12: 0.5 mg via INTRAVENOUS
  Filled 2020-05-11: qty 0.5

## 2020-05-11 MED ORDER — MELOXICAM 7.5 MG PO TABS
15.0000 mg | ORAL_TABLET | Freq: Every day | ORAL | Status: DC
  Filled 2020-05-11: qty 2

## 2020-05-11 MED ORDER — IBUPROFEN 800 MG PO TABS: 800.0000 mg | ORAL_TABLET | Freq: Three times a day (TID) | ORAL | Status: DC | PRN

## 2020-05-11 MED ORDER — POLYETHYLENE GLYCOL 3350 17 G PO PACK: 17.0000 g | PACK | Freq: Every day | ORAL | Status: DC | PRN

## 2020-05-11 MED ORDER — ROCURONIUM BROMIDE 10 MG/ML (PF) SYRINGE
PREFILLED_SYRINGE | INTRAVENOUS | Status: DC | PRN
  Administered 2020-05-11: 10 mg via INTRAVENOUS
  Administered 2020-05-11 (×2): 50 mg via INTRAVENOUS
  Administered 2020-05-11: 10 mg via INTRAVENOUS
  Administered 2020-05-11: 20 mg via INTRAVENOUS

## 2020-05-11 MED ORDER — SODIUM CHLORIDE 0.9% FLUSH: 3.0000 mL | INTRAVENOUS | Status: DC | PRN

## 2020-05-11 MED ORDER — THROMBIN 5000 UNITS EX SOLR: OROMUCOSAL | Status: DC | PRN

## 2020-05-11 MED ORDER — ONDANSETRON HCL 4 MG/2ML IJ SOLN
4.0000 mg | Freq: Four times a day (QID) | INTRAMUSCULAR | Status: DC | PRN
  Administered 2020-05-11: 21:00:00 4 mg via INTRAVENOUS
  Filled 2020-05-11: qty 2

## 2020-05-11 MED ORDER — CEFAZOLIN SODIUM-DEXTROSE 2-4 GM/100ML-% IV SOLN
2.0000 g | Freq: Three times a day (TID) | INTRAVENOUS | Status: AC
  Administered 2020-05-11 – 2020-05-12 (×2): 2 g via INTRAVENOUS
  Filled 2020-05-11 (×2): qty 100

## 2020-05-11 MED ORDER — VITAMIN D-3 125 MCG (5000 UT) PO TABS: 5000.0000 [IU] | ORAL_TABLET | Freq: Every day | ORAL | Status: DC

## 2020-05-11 MED ORDER — VITAMIN B-12 5000 MCG PO TBDP: 5000.0000 ug | ORAL_TABLET | Freq: Every day | ORAL | Status: DC

## 2020-05-11 MED ORDER — LACTATED RINGERS IV SOLN: INTRAVENOUS | Status: DC | PRN

## 2020-05-11 MED ORDER — ACETAMINOPHEN 325 MG PO TABS
650.0000 mg | ORAL_TABLET | ORAL | Status: DC | PRN
  Administered 2020-05-12 (×2): 650 mg via ORAL
  Filled 2020-05-11 (×2): qty 2

## 2020-05-11 MED ORDER — BUPROPION HCL ER (XL) 300 MG PO TB24: 300.0000 mg | ORAL_TABLET | Freq: Every day | ORAL | Status: DC

## 2020-05-11 MED ORDER — HYDROMORPHONE HCL 1 MG/ML IJ SOLN
INTRAMUSCULAR | Status: DC | PRN
  Administered 2020-05-11 (×2): .5 mg via INTRAVENOUS

## 2020-05-11 SURGICAL SUPPLY — 43 items
BAND RUBBER #18 3X1/16 STRL (MISCELLANEOUS) IMPLANT
BLADE CLIPPER SURG (BLADE) ×3 IMPLANT
BUR 2.5 MTCH HD 16 (BUR) ×2 IMPLANT
BUR 2.5MM MTCH HD 16CM (BUR) ×1
CANISTER SUCT 3000ML PPV (MISCELLANEOUS) ×3 IMPLANT
CARTRIDGE OIL MAESTRO DRILL (MISCELLANEOUS) ×1 IMPLANT
DERMABOND ADVANCED (GAUZE/BANDAGES/DRESSINGS) ×2
DERMABOND ADVANCED .7 DNX12 (GAUZE/BANDAGES/DRESSINGS) ×1 IMPLANT
DIFFUSER DRILL AIR PNEUMATIC (MISCELLANEOUS) ×3 IMPLANT
DRAPE C-ARM 42X72 X-RAY (DRAPES) ×6 IMPLANT
DRAPE LAPAROTOMY 100X72 PEDS (DRAPES) ×3 IMPLANT
DRAPE MICROSCOPE LEICA (MISCELLANEOUS) ×3 IMPLANT
DRSG OPSITE 4X5.5 SM (GAUZE/BANDAGES/DRESSINGS) ×3 IMPLANT
DURAPREP 6ML APPLICATOR 50/CS (WOUND CARE) ×3 IMPLANT
ELECT BLADE 6.5 EXT (BLADE) ×3 IMPLANT
ELECT REM PT RETURN 9FT ADLT (ELECTROSURGICAL) ×3
ELECTRODE REM PT RTRN 9FT ADLT (ELECTROSURGICAL) ×1 IMPLANT
GAUZE 4X4 16PLY RFD (DISPOSABLE) IMPLANT
GLOVE BIO SURGEON STRL SZ8 (GLOVE) ×6 IMPLANT
GLOVE BIOGEL PI IND STRL 8 (GLOVE) ×2 IMPLANT
GLOVE BIOGEL PI IND STRL 8.5 (GLOVE) ×3 IMPLANT
GLOVE BIOGEL PI INDICATOR 8 (GLOVE) ×4
GLOVE BIOGEL PI INDICATOR 8.5 (GLOVE) ×6
GLOVE ECLIPSE 8.0 STRL XLNG CF (GLOVE) ×9 IMPLANT
GLOVE EXAM NITRILE XL STR (GLOVE) IMPLANT
GOWN STRL REUS W/ TWL LRG LVL3 (GOWN DISPOSABLE) ×2 IMPLANT
GOWN STRL REUS W/ TWL XL LVL3 (GOWN DISPOSABLE) ×2 IMPLANT
GOWN STRL REUS W/TWL 2XL LVL3 (GOWN DISPOSABLE) ×6 IMPLANT
GOWN STRL REUS W/TWL LRG LVL3 (GOWN DISPOSABLE) ×4
GOWN STRL REUS W/TWL XL LVL3 (GOWN DISPOSABLE) ×4
HEMOSTAT POWDER KIT SURGIFOAM (HEMOSTASIS) ×9 IMPLANT
KIT BASIN OR (CUSTOM PROCEDURE TRAY) ×3 IMPLANT
KIT TURNOVER KIT B (KITS) ×3 IMPLANT
NEEDLE HYPO 22GX1.5 SAFETY (NEEDLE) ×3 IMPLANT
NEEDLE SPNL 20GX3.5 QUINCKE YW (NEEDLE) ×3 IMPLANT
NS IRRIG 1000ML POUR BTL (IV SOLUTION) ×3 IMPLANT
OIL CARTRIDGE MAESTRO DRILL (MISCELLANEOUS) ×3
PACK LAMINECTOMY NEURO (CUSTOM PROCEDURE TRAY) ×3 IMPLANT
PIN MAYFIELD SKULL DISP (PIN) ×3 IMPLANT
SUT VIC AB 3-0 SH 8-18 (SUTURE) ×3 IMPLANT
TOWEL GREEN STERILE (TOWEL DISPOSABLE) ×3 IMPLANT
TOWEL GREEN STERILE FF (TOWEL DISPOSABLE) ×3 IMPLANT
WATER STERILE IRR 1000ML POUR (IV SOLUTION) ×3 IMPLANT

## 2020-05-11 NOTE — Transfer of Care (Signed)
Immediate Anesthesia Transfer of Care Note  Patient: Curtis Bray.  Procedure(s) Performed: RIGHT CERVICAL FOUR-FIVE, CERVICAL FIVE-SIX FORAMINOTOMY WITH METRX (Right Spine Cervical)  Patient Location: PACU  Anesthesia Type:General  Level of Consciousness: drowsy and patient cooperative  Airway & Oxygen Therapy: Patient Spontanous Breathing and Patient connected to nasal cannula oxygen  Post-op Assessment: Report given to RN, Post -op Vital signs reviewed and stable and Patient moving all extremities  Post vital signs: Reviewed and stable  Last Vitals:  Vitals Value Taken Time  BP 155/90 05/11/20 1838  Temp    Pulse 89 05/11/20 1842  Resp 37 05/11/20 1842  SpO2 83 % 05/11/20 1842  Vitals shown include unvalidated device data.  Last Pain:  Vitals:   05/11/20 1220  TempSrc: Oral  PainSc:          Complications: No complications documented.

## 2020-05-11 NOTE — Interval H&P Note (Signed)
History and Physical Interval Note:  05/11/2020 2:23 PM  Curtis Bray.  has presented today for surgery, with the diagnosis of FORAMINAL STENOSIS OF CERVICAL REGION.  The various methods of treatment have been discussed with the patient and family. After consideration of risks, benefits and other options for treatment, the patient has consented to  Procedure(s) with comments: RIGHT CERVICAL 4-5, CERVICAL 5-6 FORAMINOTOMIES METRX (Right) - 3C/RM 21 as a surgical intervention.  The patient's history has been reviewed, patient examined, no change in status, stable for surgery.  I have reviewed the patient's chart and labs.  Questions were answered to the patient's satisfaction.     Peggyann [place]

## 2020-05-11 NOTE — Progress Notes (Signed)
Patient states arm pain is improved.  His strength is full in right Deltoid, Biceps, Triceps.  Grips strong bilaterally.  MAEW with good power.  Patient is doing well.

## 2020-05-11 NOTE — Anesthesia Postprocedure Evaluation (Signed)
Anesthesia Post Note  Patient: Curtis Bray.  Procedure(s) Performed: RIGHT CERVICAL FOUR-FIVE, CERVICAL FIVE-SIX FORAMINOTOMY WITH METRX (Right Spine Cervical)     Patient location during evaluation: PACU Anesthesia Type: General Level of consciousness: awake and alert Pain management: pain level controlled Vital Signs Assessment: post-procedure vital signs reviewed and stable Respiratory status: spontaneous breathing, nonlabored ventilation, respiratory function stable and patient connected to nasal cannula oxygen Cardiovascular status: blood pressure returned to baseline and stable Postop Assessment: no apparent nausea or vomiting Anesthetic complications: no   No complications documented.  Last Vitals:  Vitals:   05/11/20 1939 05/11/20 2000  BP: (!) 123/93 (!) 162/86  Pulse: 87 85  Resp: 18 18  Temp:  36.8 C  SpO2: 92% 95%    Last Pain:  Vitals:   05/11/20 2046  TempSrc:   PainSc: [place]

## 2020-05-11 NOTE — Brief Op Note (Signed)
05/11/2020  6:22 PM  PATIENT:  Curtis Bray.  59 y.o. 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   PROCEDURE:  Procedure(s) with comments: RIGHT CERVICAL FOUR-FIVE, CERVICAL FIVE-SIX FORAMINOTOMY WITH METRX (Right) - 3C/RM 21  SURGEON:  Surgeon(s) and Role:    Erline Levine, MD - Primary    Kristeen Miss, MD - Assisting  PHYSICIAN ASSISTANT:   ASSISTANTS: Poteat, RN   ANESTHESIA:   general  EBL:  300 mL   BLOOD ADMINISTERED:none  DRAINS: none   LOCAL MEDICATIONS USED:  LIDOCAINE   SPECIMEN:  No Specimen  DISPOSITION OF SPECIMEN:  N/A  COUNTS:  YES  TOURNIQUET:  * No tourniquets in log *   DICTATION: Patient was placed on OR table and following smooth and uncomplicated induction of general endotracheal anesthesia patient was placed in three pin head fixation and placed prone on the OR table.  C arm was used to confirm the C 45 and C 56 levels.  Skin was prepped with betadine and Duraprep and draped.  Sequential dilators were used with MetRx tubular retractor ( 9 cm x 22 mm) and docked on C 56 and subsequently the C 45 laminae.  A keyhole laminotomy was performed with high speed drill and Kerrison punches at the C 56 level.  C arm was used to confirm correct level.  Using microdissection, the lateral thecal sac was identified and the C 6  nerve root was decompressed with removal of overlying bone and ligamentous tissues.  The nerve root was thoroughly decompressed.  The foramen appeared to have significant stenosis.   We moved the MetRx tube to the C 45 level and similar decompression was performed.  The right C 5 nerve root was widely decompressed.   At this point, it was felt that neural elements were well decompressed.  Hemostasis was assured with Surgifoam.  The tubular retractor was  removed and the wound was closed with interrupted vicryl stitches and dressed with Derma bond and an occlusive dressing. The patient was removed from pins, returned to a supine position and extubated in the operating room having tolerated surgery without difficulty.  PLAN OF CARE: Admit to inpatient   PATIENT DISPOSITION:  PACU - hemodynamically stable.   Delay start of Pharmacological VTE agent (>24hrs) due to surgical blood loss or risk of bleeding: yes

## 2020-05-11 NOTE — Anesthesia Procedure Notes (Signed)
Procedure Name: Intubation Date/Time: 05/11/2020 3:15 PM Performed by: Amadeo Garnet, CRNA Pre-anesthesia Checklist: Patient identified, Emergency Drugs available, Suction available and Patient being monitored Patient Re-evaluated:Patient Re-evaluated prior to induction Oxygen Delivery Method: Circle system utilized Preoxygenation: Pre-oxygenation with 100% oxygen Induction Type: IV induction Ventilation: Oral airway inserted - appropriate to patient size, Mask ventilation with difficulty and Two handed mask ventilation required Laryngoscope Size: Mac and 4 Grade View: Grade II Tube type: Oral Tube size: 7.5 mm Number of attempts: 1 Airway Equipment and Method: Stylet and Oral airway Placement Confirmation: ETT inserted through vocal cords under direct vision,  positive ETCO2 and breath sounds checked- equal and bilateral Secured at: 23 cm Tube secured with: Tape Dental Injury: Teeth and Oropharynx as per pre-operative assessment

## 2020-05-11 NOTE — Op Note (Signed)
05/11/2020  6:22 PM  PATIENT:  Curtis Bray.  59 y.o. 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   PROCEDURE:  Procedure(s) with comments: RIGHT CERVICAL FOUR-FIVE, CERVICAL FIVE-SIX FORAMINOTOMY WITH METRX (Right) - 3C/RM 21  SURGEON:  Surgeon(s) and Role:    Erline Levine, MD - Primary    Kristeen Miss, MD - Assisting  PHYSICIAN ASSISTANT:   ASSISTANTS: Poteat, RN   ANESTHESIA:   general  EBL:  300 mL   BLOOD ADMINISTERED:none  DRAINS: none   LOCAL MEDICATIONS USED:  LIDOCAINE   SPECIMEN:  No Specimen  DISPOSITION OF SPECIMEN:  N/A  COUNTS:  YES  TOURNIQUET:  * No tourniquets in log *   DICTATION: Patient was placed on OR table and following smooth and uncomplicated induction of general endotracheal anesthesia patient was placed in three pin head fixation and placed prone on the OR table.  C arm was used to confirm the C 45 and C 56 levels.  Skin was prepped with betadine and Duraprep and draped.  Sequential dilators were used with MetRx tubular retractor ( 9 cm x 22 mm) and docked on C 56 and subsequently the C 45 laminae.  A keyhole laminotomy was performed with high speed drill and Kerrison punches at the C 56 level.  C arm was used to confirm correct level.  Using microdissection, the lateral thecal sac was identified and the C 6  nerve root was decompressed with removal of overlying bone and ligamentous tissues.  The nerve root was thoroughly decompressed.  The foramen appeared to have significant stenosis.   We moved the MetRx tube to the C 45 level and similar decompression was performed.  The right C 5 nerve root was widely decompressed.   At this point, it was felt that neural elements were well decompressed.  Hemostasis was assured with Surgifoam.  The tubular retractor was  removed and the wound was closed with interrupted vicryl stitches and dressed with Derma bond and an occlusive dressing. The patient was removed from pins, returned to a supine position and extubated in the operating room having tolerated surgery without difficulty.  PLAN OF CARE: Admit to inpatient   PATIENT DISPOSITION:  PACU - hemodynamically stable.   Delay start of Pharmacological VTE agent (>24hrs) due to surgical blood loss or risk of bleeding: yes

## 2020-05-11 NOTE — Anesthesia Preprocedure Evaluation (Signed)
Anesthesia Evaluation  Patient identified by MRN, date of birth, ID band Patient awake    Reviewed: Allergy & Precautions, NPO status , Patient's Chart, lab work & pertinent test results  Airway Mallampati: III  TM Distance: >3 FB Neck ROM: Full    Dental  (+) Teeth Intact   Pulmonary shortness of breath and with exertion, sleep apnea and Continuous Positive Airway Pressure Ventilation , former smoker,    Pulmonary exam normal        Cardiovascular negative cardio ROS   Rhythm:Regular Rate:Normal     Neuro/Psych Depression TIACVA, Residual Symptoms    GI/Hepatic Neg liver ROS, GERD  Medicated and Controlled,  Endo/Other  negative endocrine ROS  Renal/GU negative Renal ROS   Prostate Ca    Musculoskeletal  (+) Arthritis , Osteoarthritis,    Abdominal (+)  Abdomen: soft. Bowel sounds: normal.  Peds  Hematology negative hematology ROS (+)   Anesthesia Other Findings   Reproductive/Obstetrics                             Anesthesia Physical Anesthesia Plan  ASA: III  Anesthesia Plan: General   Post-op Pain Management:    Induction: Intravenous  PONV Risk Score and Plan: 2 and Ondansetron, Dexamethasone and Treatment may vary due to age or medical condition  Airway Management Planned: Mask and Oral ETT  Additional Equipment: None  Intra-op Plan:   Post-operative Plan: Extubation in OR  Informed Consent: I have reviewed the patients History and Physical, chart, labs and discussed the procedure including the risks, benefits and alternatives for the proposed anesthesia with the patient or authorized representative who has indicated his/her understanding and acceptance.     Dental advisory given  Plan Discussed with: CRNA  Anesthesia Plan Comments: (Lab Results      Component                Value               Date                      WBC                      6.4                  03/27/2020                HGB                      13.6                03/27/2020                HCT                      42.4                03/27/2020                MCV                      91.2                03/27/2020                PLT  186                 03/27/2020           )        Anesthesia Quick Evaluation

## 2020-05-12 ENCOUNTER — Encounter (HOSPITAL_COMMUNITY): Payer: Self-pay | Admitting: Neurosurgery

## 2020-05-12 DIAGNOSIS — M4802 Spinal stenosis, cervical region: Secondary | ICD-10-CM | POA: Diagnosis not present

## 2020-05-12 MED ORDER — METHOCARBAMOL 500 MG PO TABS: 500.0000 mg | ORAL_TABLET | Freq: Four times a day (QID) | ORAL | 1 refills | Status: DC | PRN

## 2020-05-12 MED ORDER — DOCUSATE SODIUM 100 MG PO CAPS: 100.0000 mg | ORAL_CAPSULE | Freq: Two times a day (BID) | ORAL | 0 refills | Status: DC

## 2020-05-12 MED ORDER — OXYCODONE HCL 5 MG PO TABS: 5.0000 mg | ORAL_TABLET | ORAL | 0 refills | Status: DC | PRN

## 2020-05-12 NOTE — Discharge Instructions (Addendum)
Wound Care Remove dressing in 2-3 days Leave incision open to air. You may shower. Do not scrub directly on incision.  Do not put any creams, lotions, or ointments on incision. Activity Walk each and every day, increasing distance each day. No lifting greater than 5 lbs.  Avoid twisting neck back and forth No driving for 2 weeks; may ride as a passenger locally.  Diet Resume your normal diet.  Return to Work Will be discussed at you follow up appointment. Call Your Doctor If Any of These Occur Redness, drainage, or swelling at the wound.  Temperature greater than 101 degrees. Severe pain not relieved by pain medication. Incision starts to come apart. Follow Up Appt Call today for appointment in 1-2 weeks ([phone]) or for problems.  If you have any hardware placed in your spine, you will need an x-ray before your appointment.

## 2020-05-12 NOTE — Evaluation (Signed)
Occupational Therapy Evaluation Patient Details Name: Curtis Bray. MRN: [id number] DOB: 1961-01-06 Today's Date: 05/12/2020    History of Present Illness Patient is a 59 year old male with long standing neck pain radiating into R shoulder to forearm and thumb. PMH includes prostate cancer, depression, chronic R shoulder pain, B carpal tunnel release late 1990s. Patient s/p anterior cervical decompression and fusion (C4-7).   Clinical Impression   Patient returned for additional procedure above.  Patient with good recall of cervical precautions, able to complete all self care and in room mobility without assist or verbal cues.  Patient preparing for discharge.  No acute or post acute needs or recommendations.      Follow Up Recommendations  No OT follow up    Equipment Recommendations  None recommended by OT    Recommendations for Other Services       Precautions / Restrictions Precautions Precautions: Cervical Precaution Booklet Issued: Yes (comment) Precaution Comments: pt able to recall all cervical precautions Required Braces or Orthoses: Cervical Brace Cervical Brace: Hard collar;For comfort Restrictions Weight Bearing Restrictions: No      Mobility Bed Mobility Overal bed mobility: Modified Independent                  Transfers Overall transfer level: Independent                    Balance Overall balance assessment: No apparent balance deficits (not formally assessed)                                         ADL either performed or assessed with clinical judgement   ADL Overall ADL's : At baseline                                       General ADL Comments: Patient is doing quite well, able to complete ADL from modified sitting position when needed.     Vision Baseline Vision/History: No visual deficits Patient Visual Report: No change from baseline       Perception     Praxis      Pertinent  Vitals/Pain Faces Pain Scale: Hurts a little bit Pain Location: neck Pain Descriptors / Indicators: Grimacing;Guarding Pain Intervention(s): Monitored during session     Hand Dominance Right   Extremity/Trunk Assessment Upper Extremity Assessment Upper Extremity Assessment: RUE deficits/detail RUE Deficits / Details: weakness with full AROM RUE Sensation: decreased light touch RUE Coordination: WNL           Communication Communication Communication: No difficulties   Cognition Arousal/Alertness: Awake/alert Behavior During Therapy: WFL for tasks assessed/performed Overall Cognitive Status: Within Functional Limits for tasks assessed                                     General Comments       Exercises     Shoulder Instructions      Home Living Family/patient expects to be discharged to:: Private residence Living Arrangements: Spouse/significant other Available Help at Discharge: Family Type of Home: House Home Access: Stairs to enter Technical brewer of Steps: 4 Entrance Stairs-Rails: None Home Layout: One level     Bathroom Shower/Tub: Hospital doctor  Toilet: Handicapped height Bathroom Accessibility: Yes How Accessible: Accessible via walker Home Equipment: Cane - single point   Additional Comments: has adjustable bed at home      Prior Functioning/Environment Level of Independence: Independent        Comments: works as Estate manager/land agent)        OT Problem List: Pain      OT Treatment/Interventions:      OT Goals(Current goals can be found in the care plan section) Acute Rehab OT Goals Patient Stated Goal: to go home OT Goal Formulation: With patient Time For Goal Achievement: 04/06/20 Potential to Achieve Goals: Good  OT Frequency:     Barriers to D/C:  none          Co-evaluation              AM-PAC OT "6 Clicks" Daily Activity     Outcome Measure Help from another person  eating meals?: None Help from another person taking care of personal grooming?: None Help from another person toileting, which includes using toliet, bedpan, or urinal?: None Help from another person bathing (including washing, rinsing, drying)?: None Help from another person to put on and taking off regular upper body clothing?: None Help from another person to put on and taking off regular lower body clothing?: None 6 Click Score: 24   End of Session Nurse Communication: Mobility status  Activity Tolerance: Patient tolerated treatment well Patient left: in bed;with call bell/phone within reach  OT Visit Diagnosis: Pain Pain - Right/Left: Right Pain - part of body: Shoulder                Time: 4076-8088 OT Time Calculation (min): 27 min Charges:  OT General Charges $OT Visit: 1 Visit OT Evaluation $OT Eval Moderate Complexity: 1 Mod OT Treatments $Self Care/Home Management : 8-22 mins  05/12/2020  Rich, OTR/L  Acute Rehabilitation Services  Office:  [phone]   Curtis Bray 05/12/2020, 9:24 AM

## 2020-05-12 NOTE — Discharge Summary (Signed)
Physician Discharge Summary     Providing Compassionate, Quality Care - Together   Patient ID: Curtis Bray. MRN: [id number] DOB/AGE: December 11, 1960 59 y.o.  Admit date: 05/11/2020 Discharge date: 05/12/2020  Admission Diagnoses: Cervical stenosis of spinal canal  Discharge Diagnoses:  Active Problems:   Cervical stenosis of spinal canal   Discharged Condition: good  Hospital Course: Patient underwent an C4-5, C5-6 posterior cervical fusion by Dr. Vertell Limber on 05/11/2020. He was admitted to 3C05 overnight for observation. His postoperative course has been uncomplicated. He has worked with both physical and occupational therapies who feel he is ready for discharge home. He is ambulating independently and without difficulty. He is tolerating a normal diet and swallowing without difficulty. He is not having any bowel or bladder dysfunction. His pain is well-controlled with oral pain medication. He is ready for discharge home.   Consults: None  Significant Diagnostic Studies: radiology: DG Cervical Spine 2-3 Views  Result Date: 05/11/2020 CLINICAL DATA:  Elective surgery. Additional history provided: foraminotomy C4-6. Provided fluoroscopy time 42 seconds (13.44 mGy). EXAM: CERVICAL SPINE - 2-3 VIEW; DG C-ARM 1-60 MIN COMPARISON:  Cervical spine MRI 05/05/2020. CT cervical spine 04/10/2020 FINDINGS: PA intraoperative fluoroscopic images of the cervical spine are submitted, 3 images total. Redemonstrated ACDF hardware at the C4-C7 levels. Surgical instruments overlie the right aspect of the mid-to-lower cervical spine, compatible with the provided history of C4-C6 foraminotomy. Partially visualized support tubes. IMPRESSION: Three intraoperative fluoroscopic images of the cervical spine, as described. Electronically Signed   By: Kellie Simmering DO   On: 05/11/2020 18:04   MR BRAIN W WO CONTRAST  Result Date: 05/09/2020 CLINICAL DATA:  Right eye vision loss EXAM: MRI HEAD WITHOUT AND WITH  CONTRAST MRV HEAD WITHOUT AND WITH CONTRAST TECHNIQUE: Multiplanar, multiecho pulse sequences of the brain and surrounding structures were obtained without and with intravenous contrast. Angiographic images of the intracranial venous structures were obtained using MRV technique without and with intravenous contrast. CONTRAST:  42mL GADAVIST GADOBUTROL 1 MMOL/ML IV SOLN COMPARISON:  Brain MRI 01/24/2019 FINDINGS: MRI BRAIN FINDINGS Brain: No acute infarct, mass effect or extra-axial collection. No acute or chronic hemorrhage. Normal white matter signal, parenchymal volume and CSF spaces. The midline structures are normal. Vascular: Major flow voids are preserved. Skull and upper cervical spine: Normal calvarium and skull base. Visualized upper cervical spine and soft tissues are normal. Sinuses/Orbits:No paranasal sinus fluid levels or advanced mucosal thickening. No mastoid or middle ear effusion. Normal orbits. MRV BRAIN FINDINGS Superior sagittal sinus: Normal. Straight sinus: Normal. Inferior sagittal sinus, vein of Galen and internal cerebral veins: Normal. Transverse sinuses: Normal. Sigmoid sinuses: Normal. Visualized jugular veins: Normal. IMPRESSION: Normal MRI and MRV of the brain. Electronically Signed   By: Ulyses Jarred M.D.   On: 05/09/2020 01:44   DG C-Arm 1-60 Min  Result Date: 05/11/2020 CLINICAL DATA:  Elective surgery. Additional history provided: foraminotomy C4-6. Provided fluoroscopy time 42 seconds (13.44 mGy). EXAM: CERVICAL SPINE - 2-3 VIEW; DG C-ARM 1-60 MIN COMPARISON:  Cervical spine MRI 05/05/2020. CT cervical spine 04/10/2020 FINDINGS: PA intraoperative fluoroscopic images of the cervical spine are submitted, 3 images total. Redemonstrated ACDF hardware at the C4-C7 levels. Surgical instruments overlie the right aspect of the mid-to-lower cervical spine, compatible with the provided history of C4-C6 foraminotomy. Partially visualized support tubes. IMPRESSION: Three intraoperative  fluoroscopic images of the cervical spine, as described. Electronically Signed   By: Kellie Simmering DO   On: 05/11/2020 18:04   MR MRV  HEAD W WO CONTRAST  Result Date: 05/09/2020 CLINICAL DATA:  Right eye vision loss EXAM: MRI HEAD WITHOUT AND WITH CONTRAST MRV HEAD WITHOUT AND WITH CONTRAST TECHNIQUE: Multiplanar, multiecho pulse sequences of the brain and surrounding structures were obtained without and with intravenous contrast. Angiographic images of the intracranial venous structures were obtained using MRV technique without and with intravenous contrast. CONTRAST:  1mL GADAVIST GADOBUTROL 1 MMOL/ML IV SOLN COMPARISON:  Brain MRI 01/24/2019 FINDINGS: MRI BRAIN FINDINGS Brain: No acute infarct, mass effect or extra-axial collection. No acute or chronic hemorrhage. Normal white matter signal, parenchymal volume and CSF spaces. The midline structures are normal. Vascular: Major flow voids are preserved. Skull and upper cervical spine: Normal calvarium and skull base. Visualized upper cervical spine and soft tissues are normal. Sinuses/Orbits:No paranasal sinus fluid levels or advanced mucosal thickening. No mastoid or middle ear effusion. Normal orbits. MRV BRAIN FINDINGS Superior sagittal sinus: Normal. Straight sinus: Normal. Inferior sagittal sinus, vein of Galen and internal cerebral veins: Normal. Transverse sinuses: Normal. Sigmoid sinuses: Normal. Visualized jugular veins: Normal. IMPRESSION: Normal MRI and MRV of the brain. Electronically Signed   By: Ulyses Jarred M.D.   On: 05/09/2020 01:44     Treatments: surgery: RIGHT CERVICAL FOUR-FIVE, CERVICAL FIVE-SIX FORAMINOTOMY WITH METRX (Right)   Discharge Exam: Blood pressure 113/60, pulse 100, temperature 98.3 F (36.8 C), temperature source Oral, resp. rate 17, height 5' 10.5" (1.791 m), weight 127.9 kg, SpO2 94 %.   Alert and oriented x 4 PERRLA CN II-XII grossly intact MAE, Strength and sensation intact Incision is covered with  Honeycomb dressing and Steri Strips; Dressing is clean, dry, and intact   Disposition: Discharge disposition: 01-Home or Self Care        Allergies as of 05/12/2020   No Known Allergies     Medication List    STOP taking these medications   cyclobenzaprine 10 MG tablet Commonly known as: FLEXERIL   ibuprofen 200 MG tablet Commonly known as: ADVIL   predniSONE 10 MG tablet Commonly known as: DELTASONE   traMADol 50 MG tablet Commonly known as: ULTRAM     TAKE these medications   Aspirin Low Dose 81 MG EC tablet Generic drug: aspirin TAKE 1 TABLET BY MOUTH ONCE DAILY What changed: how much to take   buPROPion 300 MG 24 hr tablet Commonly known as: WELLBUTRIN XL Take 1 tablet (300 mg total) by mouth daily.   CITRACAL +D3 PO Take 1 tablet by mouth in the morning and at bedtime.   CoQ10 200 MG Caps Take 200 mg by mouth at bedtime.   docusate sodium 100 MG capsule Commonly known as: COLACE Take 1 capsule (100 mg total) by mouth 2 (two) times daily.   fluticasone 50 MCG/ACT nasal spray Commonly known as: FLONASE Place 2 sprays into both nostrils daily. What changed:   when to take this  reasons to take this   gabapentin 600 MG tablet Commonly known as: NEURONTIN Take 600-900 mg by mouth See admin instructions. Take 600 mg by mouth in the morning and 900 mg at night   meloxicam 15 MG tablet Commonly known as: MOBIC Take 1 tablet (15 mg total) by mouth daily.   methocarbamol 500 MG tablet Commonly known as: ROBAXIN Take 1 tablet (500 mg total) by mouth every 6 (six) hours as needed for muscle spasms. What changed: when to take this   oxyCODONE 5 MG immediate release tablet Commonly known as: Oxy IR/ROXICODONE Take 1-2 tablets (5-10 mg total)  by mouth every 4 (four) hours as needed for severe pain.   RABEprazole 20 MG tablet Commonly known as: ACIPHEX Take 2 tablets (40 mg total) by mouth daily. What changed: when to take this   simvastatin 40  MG tablet Commonly known as: ZOCOR Take 1 tablet (40 mg total) by mouth at bedtime.   tadalafil 10 MG tablet Commonly known as: Cialis Take 1 tablet (10 mg total) by mouth daily as needed for erectile dysfunction.   tamsulosin 0.4 MG Caps capsule Commonly known as: FLOMAX Take 2 capsules (0.8 mg total) by mouth daily.   traZODone 50 MG tablet Commonly known as: DESYREL Take 2 tablets (100 mg total) by mouth at bedtime.   Vitamin B-12 5000 MCG Tbdp Take 5,000 mcg by mouth at bedtime.   Vitamin D-3 125 MCG (5000 UT) Tabs Take 5,000 Units by mouth at bedtime.       Follow-up Information    Erline Levine, MD. Schedule an appointment as soon as possible for a visit in 2 week(s).   Specialty: Neurosurgery Contact information: [address] [place] [address] [place] [address] [phone]               Signed: Patricia Nettle 05/12/2020, 9:52 AM

## 2020-05-12 NOTE — Evaluation (Signed)
Physical Therapy Evaluation Patient Details Name: Curtis Bray. MRN: [id number] DOB: 1960/08/21 Today's Date: 05/12/2020   History of Present Illness  59 y.o. male with neck and RUE pain. Pt previously underwent C4-7 ACDF on 03/30/2020, developing significant R arm pain shortly after. Pt R C4-5 and C5-6 foraminotomy on 05/11/2020. PMH includes prostate cancer, depression, chronic R shoulder pain, B carpal tunnel release late 1990s.  Clinical Impression  Pt presents to PT s/p R C4-5 and C5-6 foraminotomy. Pt is able to perform all mobility required in the home setting at a modI or independent level. Pt ambulates well and negotiates stairs without difficulty. Pt does have R eye vision deficits which he reports does cause him to bump into objects on the R side and negotiate stairs with caution, however no issues are observed at this time. Pt is able to verbalized all cervical precautions after earlier session with OT. Pt has no further acute PT needs at this time. Pt is encouraged to ambulate multiple times each day to continue progressing upon activity tolerance. Pt has no current PT or DME needs.     Follow Up Recommendations No PT follow up    Equipment Recommendations  None recommended by PT    Recommendations for Other Services       Precautions / Restrictions Precautions Precautions: Cervical Precaution Booklet Issued: Yes (comment) Precaution Comments: pt able to recall all cervical precautions Required Braces or Orthoses: Cervical Brace Cervical Brace: Hard collar;For comfort Restrictions Weight Bearing Restrictions: No      Mobility  Bed Mobility Overal bed mobility: Modified Independent             General bed mobility comments: pt performs long sitting to sitting at edge of bed and sitting at edge of bed to long sitting in bed    Transfers Overall transfer level: Independent Equipment used: None Transfers: Sit to/from Stand Sit to Stand: Independent             Ambulation/Gait Ambulation/Gait assistance: Independent Gait Distance (Feet): 150 Feet Assistive device: None Gait Pattern/deviations: WFL(Within Functional Limits)        Stairs Stairs: Yes Stairs assistance: Modified independent (Device/Increase time) Stair Management: One rail Right;Alternating pattern Number of Stairs: 10 General stair comments: cues for step-to when descending to improve safety due to R eye vision deficits  Wheelchair Mobility    Modified Rankin (Stroke Patients Only)       Balance Overall balance assessment: No apparent balance deficits (not formally assessed)                                           Pertinent Vitals/Pain Pain Assessment: Faces Faces Pain Scale: Hurts a little bit Pain Location: neck Pain Descriptors / Indicators: Grimacing;Guarding Pain Intervention(s): Monitored during session    Home Living Family/patient expects to be discharged to:: Private residence Living Arrangements: Spouse/significant other Available Help at Discharge: Family Type of Home: House Home Access: Stairs to enter Entrance Stairs-Rails: None Technical brewer of Steps: 4 Home Layout: One level Home Equipment: Cane - single point Additional Comments: has adjustable bed at home    Prior Function Level of Independence: Independent         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)     Hand Dominance   Dominant Hand: Right    Extremity/Trunk Assessment   Upper Extremity Assessment Upper Extremity  Assessment: RUE deficits/detail RUE Deficits / Details: weakness with full AROM RUE Sensation: decreased light touch RUE Coordination: WNL    Lower Extremity Assessment Lower Extremity Assessment: Overall WFL for tasks assessed    Cervical / Trunk Assessment Cervical / Trunk Assessment: Kyphotic  Communication   Communication: No difficulties  Cognition Arousal/Alertness: Awake/alert Behavior  During Therapy: WFL for tasks assessed/performed Overall Cognitive Status: Within Functional Limits for tasks assessed                                        General Comments General comments (skin integrity, edema, etc.): pt reports R eye vision deficits    Exercises     Assessment/Plan    PT Assessment Patent does not need any further PT services  PT Problem List         PT Treatment Interventions      PT Goals (Current goals can be found in the Care Plan section)  Acute Rehab PT Goals Patient Stated Goal: to go home    Frequency     Barriers to discharge        Co-evaluation               AM-PAC PT "6 Clicks" Mobility  Outcome Measure Help needed turning from your back to your side while in a flat bed without using bedrails?: None Help needed moving from lying on your back to sitting on the side of a flat bed without using bedrails?: None Help needed moving to and from a bed to a chair (including a wheelchair)?: None Help needed standing up from a chair using your arms (e.g., wheelchair or bedside chair)?: None Help needed to walk in hospital room?: None Help needed climbing 3-5 steps with a railing? : None 6 Click Score: 24    End of Session   Activity Tolerance: Patient tolerated treatment well Patient left: in bed;with call bell/phone within reach Nurse Communication: Mobility status      Time: 6578-4696 PT Time Calculation (min) (ACUTE ONLY): 16 min   Charges:   PT Evaluation $PT Eval Low Complexity: [place], PT, DPT Acute Rehabilitation Pager: [phone]   Zenaida Niece 05/12/2020, 9:24 AM

## 2020-05-12 NOTE — Progress Notes (Signed)
Patient alert and oriented, mae's well, voiding adequate amount of urine, swallowing without difficulty, no c/o pain at time of discharge. Patient discharged home with family. Script and discharged instructions given to patient. Patient and family stated understanding of instructions given. Patient has an appointment with Dr.Stern    

## 2020-05-14 MED FILL — Thrombin For Soln 5000 Unit: CUTANEOUS | Qty: 5000 | Status: AC

## 2020-05-15 ENCOUNTER — Ambulatory Visit: Payer: BC Managed Care – PPO

## 2020-05-28 ENCOUNTER — Other Ambulatory Visit: Payer: Self-pay | Admitting: Family Medicine

## 2020-06-04 ENCOUNTER — Other Ambulatory Visit: Payer: Self-pay | Admitting: Family Medicine

## 2020-06-09 ENCOUNTER — Other Ambulatory Visit: Payer: Self-pay

## 2020-06-09 DIAGNOSIS — Z20822 Contact with and (suspected) exposure to covid-19: Secondary | ICD-10-CM

## 2020-06-12 ENCOUNTER — Other Ambulatory Visit: Payer: Self-pay | Admitting: Urology

## 2020-06-13 LAB — NOVEL CORONAVIRUS, NAA: SARS-CoV-2, NAA: DETECTED — AB

## 2020-06-14 ENCOUNTER — Telehealth: Payer: Self-pay | Admitting: Family

## 2020-06-14 NOTE — Telephone Encounter (Signed)
Called to discuss with patient about COVID-19 symptoms and the use of one of the available treatments for those with mild to moderate Covid symptoms and at a high risk of hospitalization.  Pt appears to qualify for outpatient treatment due to co-morbid conditions and/or a member of an at-risk group in accordance with the FDA Emergency Use Authorization.    Symptom onset:  Vaccinated: Yes 2 doses Pfizer last 8/21 Booster? Not eligible  Immunocompromised?  Qualifiers: Hyperlipidemia, Obesity, prostate cancer, sleep apnea Tested Positive 1/15  I was unable to get in contact with Mr. Haik via phone and left a voicemail with call back number as well as MyChart message.   Terri Piedra, NP 06/14/2020 9:07 AM

## 2020-06-16 ENCOUNTER — Telehealth: Payer: Self-pay | Admitting: Physician Assistant

## 2020-06-16 NOTE — Telephone Encounter (Signed)
Called to discuss with [name] about Covid symptoms and the use of sotrovimab, remdisivir or oral therapies for those with mild to moderate Covid symptoms and at a high risk of hospitalization.     Pt does not qualify as pt's symptoms first presented > 10 days prior to timing of infusion. Symptoms tier reviewed as well as criteria for ending isolation. Preventative practices reviewed. Patient verbalized understanding  Patient Active Problem List   Diagnosis Date Noted  . Cervical stenosis of spinal canal 03/30/2020  . Depression, major, single episode, moderate ([place]) 11/25/2019  . Osteoarthritis of spine with radiculopathy, lumbar region 11/21/2019  . Malignant neoplasm of prostate ([place]) 03/21/2019  . Stenosis, cervical spine 04/15/2017  . Lung nodule 03/20/2017  . Neck pain 03/05/2017  . Morbid obesity with BMI of 40.0-44.9, adult ([place]) 10/14/2016  . Chronic right shoulder pain 09/17/2016  . DDD (degenerative disc disease), cervical 09/17/2016  . Right carpal tunnel syndrome 09/12/2016  . Insomnia 09/13/2015  . 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 Numbness and tingling of right thumb 03/07/2015  . Paresthesia of skin 03/07/2015  . Umbilical hernia without obstruction and without gangrene 02/13/2015  . Chronic nasal congestion 02/13/2015  . Sleep apnea   . Hyperlipidemia   . GERD (gastroesophageal reflux disease)     Angelena Form PA-C

## 2020-06-29 ENCOUNTER — Encounter: Payer: Self-pay | Admitting: Family Medicine

## 2020-06-29 ENCOUNTER — Ambulatory Visit: Payer: BC Managed Care – PPO | Admitting: Family Medicine

## 2020-06-29 ENCOUNTER — Other Ambulatory Visit: Payer: Self-pay

## 2020-06-29 VITALS — BP 138/80 | HR 70 | Temp 98.4°F | Wt 282.0 lb

## 2020-06-29 DIAGNOSIS — R251 Tremor, unspecified: Secondary | ICD-10-CM

## 2020-06-29 DIAGNOSIS — R059 Cough, unspecified: Secondary | ICD-10-CM | POA: Diagnosis not present

## 2020-06-29 DIAGNOSIS — R739 Hyperglycemia, unspecified: Secondary | ICD-10-CM | POA: Diagnosis not present

## 2020-06-29 DIAGNOSIS — Z125 Encounter for screening for malignant neoplasm of prostate: Secondary | ICD-10-CM

## 2020-06-29 DIAGNOSIS — H547 Unspecified visual loss: Secondary | ICD-10-CM

## 2020-06-29 DIAGNOSIS — E782 Mixed hyperlipidemia: Secondary | ICD-10-CM

## 2020-06-29 LAB — BAYER DCA HB A1C WAIVED: HB A1C (BAYER DCA - WAIVED): 5.8 % (ref <7.0)

## 2020-06-29 NOTE — Progress Notes (Signed)
BP 138/80   Pulse 70   Temp 98.4 F (36.9 C)   Wt 282 lb (127.9 kg)   SpO2 98%   BMI 39.89 kg/m    Subjective:    Patient ID: Curtis Ran., male    DOB: November 09, 1960, 60 y.o.   MRN: [id number]  HPI: Curtis Bray. is a 60 y.o. male  Chief Complaint  Patient presents with  . Eye Problem    Patient states for a few months his vision has not been good, has seen eye doctor and neurological ophthalmologist. Patient see double vision and blurry    . Cough    Patient states he has an intermittent cough, was positive for Covid in early January.    Has been seeing opthalmology and neuro-ophthalmology for the double vision and the vision loss. Has had some concentration issues and having word finding issues and tremor of the L hand. He had an MRI which was thankfully normal, but since then, he has not had any follow up with neurology and he states that they were not sure what was going on with him. He notes that he has vertical double vision- he has been seeing 2 things standing on top of each other. He notes that he has been feeling tired. He has recovered from [place], but he does note that he has continued with a bit of a cough. No fever. No chills. No other concerns about that. He was more concerned about there being something more systemic going on. He would like to make sure that his labs are looking OK to try to figure out other options.    Relevant past medical, surgical, family and social history reviewed and updated as indicated. Interim medical history since our last visit reviewed. Allergies and medications reviewed and updated.  Review of Systems  Eyes: Positive for visual disturbance. Negative for photophobia, pain, discharge, redness and itching.  Respiratory: Positive for cough. Negative for apnea, choking, chest tightness, shortness of breath, wheezing and stridor.   Cardiovascular: Negative.   Gastrointestinal: Negative.   Neurological: Positive for dizziness,  tremors, light-headedness and numbness. Negative for seizures, syncope, facial asymmetry, speech difficulty, weakness and headaches.  Psychiatric/Behavioral: Negative.     Per HPI unless specifically indicated above     Objective:    BP 138/80   Pulse 70   Temp 98.4 F (36.9 C)   Wt 282 lb (127.9 kg)   SpO2 98%   BMI 39.89 kg/m   Wt Readings from Last 3 Encounters:  06/29/20 282 lb (127.9 kg)  05/11/20 282 lb (127.9 kg)  04/10/20 275 lb 12.8 oz (125.1 kg)    Physical Exam Vitals and nursing note reviewed.  Constitutional:      General: He is not in acute distress.    Appearance: Normal appearance. He is not ill-appearing, toxic-appearing or diaphoretic.  HENT:     Head: Normocephalic and atraumatic.     Right Ear: External ear normal.     Left Ear: External ear normal.     Nose: Nose normal.     Mouth/Throat:     Mouth: Mucous membranes are moist.     Pharynx: Oropharynx is clear.  Eyes:     General: No scleral icterus.       Right eye: No discharge.        Left eye: No discharge.     Extraocular Movements: Extraocular movements intact.     Conjunctiva/sclera: Conjunctivae normal.     Pupils:  Pupils are equal, round, and reactive to light.  Cardiovascular:     Rate and Rhythm: Normal rate and regular rhythm.     Pulses: Normal pulses.     Heart sounds: Normal heart sounds. No murmur heard. No friction rub. No gallop.   Pulmonary:     Effort: Pulmonary effort is normal. No respiratory distress.     Breath sounds: Normal breath sounds. No stridor. No wheezing, rhonchi or rales.  Chest:     Chest wall: No tenderness.  Musculoskeletal:        General: Normal range of motion.     Cervical back: Normal range of motion and neck supple.  Skin:    General: Skin is warm and dry.     Capillary Refill: Capillary refill takes less than 2 seconds.     Coloration: Skin is not jaundiced or pale.     Findings: No bruising, erythema, lesion or rash.  Neurological:      General: No focal deficit present.     Mental Status: He is alert and oriented to person, place, and time. Mental status is at baseline.  Psychiatric:        Mood and Affect: Mood normal.        Behavior: Behavior normal.        Thought Content: Thought content normal.        Judgment: Judgment normal.     Results for orders placed or performed in visit on 06/29/20  Comprehensive metabolic panel  Result Value Ref Range   Glucose 101 (H) 65 - 99 mg/dL   BUN 12 6 - 24 mg/dL   Creatinine, Ser 1.11 0.76 - 1.27 mg/dL   GFR calc non Af Amer 72 >59 mL/min/1.73   GFR calc Af Amer 84 >59 mL/min/1.73   BUN/Creatinine Ratio 11 9 - 20   Sodium 138 134 - 144 mmol/L   Potassium 4.0 3.5 - 5.2 mmol/L   Chloride 102 96 - 106 mmol/L   CO2 24 20 - 29 mmol/L   Calcium 9.7 8.7 - 10.2 mg/dL   Total Protein 6.8 6.0 - 8.5 g/dL   Albumin 4.8 3.8 - 4.9 g/dL   Globulin, Total 2.0 1.5 - 4.5 g/dL   Albumin/Globulin Ratio 2.4 (H) 1.2 - 2.2   Bilirubin Total 0.3 0.0 - 1.2 mg/dL   Alkaline Phosphatase 96 44 - 121 IU/L   AST 15 0 - 40 IU/L   ALT 22 0 - 44 IU/L  Bayer DCA Hb A1c Waived  Result Value Ref Range   HB A1C (BAYER DCA - WAIVED) 5.8 <7.0 %  CBC with Differential/Platelet  Result Value Ref Range   WBC 8.5 3.4 - 10.8 x10E3/uL   RBC 5.27 4.14 - 5.80 x10E6/uL   Hemoglobin 15.1 13.0 - 17.7 g/dL   Hematocrit 46.0 37.5 - 51.0 %   MCV 87 79 - 97 fL   MCH 28.7 26.6 - 33.0 pg   MCHC 32.8 31.5 - 35.7 g/dL   RDW 12.6 11.6 - 15.4 %   Platelets 222 150 - 450 x10E3/uL   Neutrophils 55 Not Estab. %   Lymphs 36 Not Estab. %   Monocytes 8 Not Estab. %   Eos 1 Not Estab. %   Basos 0 Not Estab. %   Neutrophils Absolute 4.6 1.4 - 7.0 x10E3/uL   Lymphocytes Absolute 3.1 0.7 - 3.1 x10E3/uL   Monocytes Absolute 0.7 0.1 - 0.9 x10E3/uL   EOS (ABSOLUTE) 0.1 0.0 - 0.4 x10E3/uL  Basophils Absolute 0.0 0.0 - 0.2 x10E3/uL   Immature Granulocytes 0 Not Estab. %   Immature Grans (Abs) 0.0 0.0 - 0.1 x10E3/uL  TSH   Result Value Ref Range   TSH 3.480 0.450 - 4.500 uIU/mL  VITAMIN D 25 Hydroxy (Vit-D Deficiency, Fractures)  Result Value Ref Range   Vit D, 25-Hydroxy 45.5 30.0 - 100.0 ng/mL  PSA  Result Value Ref Range   Prostate Specific Ag, Serum 0.1 0.0 - 4.0 ng/mL  Lipid Panel w/o Chol/HDL Ratio  Result Value Ref Range   Cholesterol, Total 139 100 - 199 mg/dL   Triglycerides 210 (H) 0 - 149 mg/dL   HDL 39 (L) >39 mg/dL   VLDL Cholesterol Cal 35 5 - 40 mg/dL   LDL Chol Calc (NIH) 65 0 - 99 mg/dL  RA Qn+CCP(IgG/A)+SjoSSA+SjoSSB  Result Value Ref Range   Rhuematoid fact SerPl-aCnc <10.0 <14.0 IU/mL   ENA SSA (RO) Ab 0.2 0.0 - 0.9 AI   ENA SSB (LA) Ab 2.0 (H) 0.0 - 0.9 AI   Cyclic Citrullin Peptide Ab 2 0 - 19 units  Antinuclear Antib (ANA)  Result Value Ref Range   Anti Nuclear Antibody (ANA) Positive (A) Negative  Sed Rate (ESR)  Result Value Ref Range   Sed Rate 9 0 - 30 mm/hr  CK (Creatine Kinase)  Result Value Ref Range   Total CK 115 41 - 331 U/L  RPR  Result Value Ref Range   RPR Ser Ql Non Reactive Non Reactive      Assessment & Plan:   Problem List Items Addressed This Visit      Other   Hyperlipidemia    Checking labs today. Await results. Treat as needed.       Relevant Orders   Lipid Panel w/o Chol/HDL Ratio (Completed)    Other Visit Diagnoses    Tremor    -  Primary   Worsening. Will get him back into see neurology. Call with any concerns. Await their input.    Relevant Orders   Comprehensive metabolic panel (Completed)   CBC with Differential/Platelet (Completed)   TSH (Completed)   VITAMIN D 25 Hydroxy (Vit-D Deficiency, Fractures) (Completed)   RA Qn+CCP(IgG/A)+SjoSSA+SjoSSB (Completed)   Antinuclear Antib (ANA) (Completed)   Sed Rate (ESR) (Completed)   CK (Creatine Kinase) (Completed)   Vision loss       Worsening. Will get him back into see neurology. Call with any concerns. Await their input.    Relevant Orders   Comprehensive metabolic panel  (Completed)   CBC with Differential/Platelet (Completed)   RA Qn+CCP(IgG/A)+SjoSSA+SjoSSB (Completed)   Antinuclear Antib (ANA) (Completed)   Sed Rate (ESR) (Completed)   CK (Creatine Kinase) (Completed)   RPR   Cough       Lungs clear today. Likely post-covid irritation. Call with any concerns. Continue to monitor.    Elevated serum glucose       Checking labs today. Await results. Treat as needed.    Relevant Orders   Comprehensive metabolic panel (Completed)   Bayer DCA Hb A1c Waived (Completed)   Screening for prostate cancer       Checking labs today. Await results. Treat as needed.    Relevant Orders   PSA (Completed)       Follow up plan: Return in about 4 weeks (around 07/27/2020).   >25 minutes spent with patient today

## 2020-07-01 ENCOUNTER — Other Ambulatory Visit: Payer: Self-pay | Admitting: Family Medicine

## 2020-07-01 DIAGNOSIS — R899 Unspecified abnormal finding in specimens from other organs, systems and tissues: Secondary | ICD-10-CM

## 2020-07-01 DIAGNOSIS — R768 Other specified abnormal immunological findings in serum: Secondary | ICD-10-CM

## 2020-07-01 DIAGNOSIS — R251 Tremor, unspecified: Secondary | ICD-10-CM

## 2020-07-01 DIAGNOSIS — H547 Unspecified visual loss: Secondary | ICD-10-CM

## 2020-07-02 LAB — PSA: Prostate Specific Ag, Serum: 0.1 ng/mL (ref 0.0–4.0)

## 2020-07-02 LAB — COMPREHENSIVE METABOLIC PANEL
ALT: 22 IU/L (ref 0–44)
AST: 15 IU/L (ref 0–40)
Albumin/Globulin Ratio: 2.4 — ABNORMAL HIGH (ref 1.2–2.2)
Albumin: 4.8 g/dL (ref 3.8–4.9)
Alkaline Phosphatase: 96 IU/L (ref 44–121)
BUN/Creatinine Ratio: 11 (ref 9–20)
BUN: 12 mg/dL (ref 6–24)
Bilirubin Total: 0.3 mg/dL (ref 0.0–1.2)
CO2: 24 mmol/L (ref 20–29)
Calcium: 9.7 mg/dL (ref 8.7–10.2)
Chloride: 102 mmol/L (ref 96–106)
Creatinine, Ser: 1.11 mg/dL (ref 0.76–1.27)
GFR calc Af Amer: 84 mL/min/{1.73_m2} (ref >59)
GFR calc non Af Amer: 72 mL/min/{1.73_m2} (ref >59)
Globulin, Total: 2 g/dL (ref 1.5–4.5)
Glucose: 101 mg/dL — ABNORMAL HIGH (ref 65–99)
Potassium: 4 mmol/L (ref 3.5–5.2)
Sodium: 138 mmol/L (ref 134–144)
Total Protein: 6.8 g/dL (ref 6.0–8.5)

## 2020-07-02 LAB — CBC WITH DIFFERENTIAL/PLATELET
Basophils Absolute: 0 10*3/uL (ref 0.0–0.2)
Basos: 0 %
EOS (ABSOLUTE): 0.1 10*3/uL (ref 0.0–0.4)
Eos: 1 %
Hematocrit: 46 % (ref 37.5–51.0)
Hemoglobin: 15.1 g/dL (ref 13.0–17.7)
Immature Grans (Abs): 0 10*3/uL (ref 0.0–0.1)
Immature Granulocytes: 0 %
Lymphocytes Absolute: 3.1 10*3/uL (ref 0.7–3.1)
Lymphs: 36 %
MCH: 28.7 pg (ref 26.6–33.0)
MCHC: 32.8 g/dL (ref 31.5–35.7)
MCV: 87 fL (ref 79–97)
Monocytes Absolute: 0.7 10*3/uL (ref 0.1–0.9)
Monocytes: 8 %
Neutrophils Absolute: 4.6 10*3/uL (ref 1.4–7.0)
Neutrophils: 55 %
Platelets: 222 10*3/uL (ref 150–450)
RBC: 5.27 x10E6/uL (ref 4.14–5.80)
RDW: 12.6 % (ref 11.6–15.4)
WBC: 8.5 10*3/uL (ref 3.4–10.8)

## 2020-07-02 LAB — RPR: RPR Ser Ql: NONREACTIVE

## 2020-07-02 LAB — RA QN+CCP(IGG/A)+SJOSSA+SJOSSB
Cyclic Citrullin Peptide Ab: 2 units (ref 0–19)
ENA SSA (RO) Ab: 0.2 AI (ref 0.0–0.9)
ENA SSB (LA) Ab: 2 AI — ABNORMAL HIGH (ref 0.0–0.9)
Rheumatoid fact SerPl-aCnc: <10 IU/mL (ref <14.0)

## 2020-07-02 LAB — SEDIMENTATION RATE: Sed Rate: 9 mm/hr (ref 0–30)

## 2020-07-02 LAB — TSH: TSH: 3.48 u[IU]/mL (ref 0.450–4.500)

## 2020-07-02 LAB — ANA: Anti Nuclear Antibody (ANA): POSITIVE — AB

## 2020-07-02 LAB — VITAMIN D 25 HYDROXY (VIT D DEFICIENCY, FRACTURES): Vit D, 25-Hydroxy: 45.5 ng/mL (ref 30.0–100.0)

## 2020-07-02 LAB — LIPID PANEL W/O CHOL/HDL RATIO
Cholesterol, Total: 139 mg/dL (ref 100–199)
HDL: 39 mg/dL — ABNORMAL LOW (ref >39)
LDL Chol Calc (NIH): 65 mg/dL (ref 0–99)
Triglycerides: 210 mg/dL — ABNORMAL HIGH (ref 0–149)
VLDL Cholesterol Cal: 35 mg/dL (ref 5–40)

## 2020-07-02 LAB — CK: Total CK: 115 U/L (ref 41–331)

## 2020-07-08 ENCOUNTER — Encounter: Payer: Self-pay | Admitting: Family Medicine

## 2020-07-08 NOTE — Assessment & Plan Note (Signed)
Checking labs today. Await results. Treat as needed.  

## 2020-07-13 ENCOUNTER — Encounter: Payer: Self-pay | Admitting: Family Medicine

## 2020-07-16 ENCOUNTER — Ambulatory Visit: Payer: Self-pay | Admitting: *Deleted

## 2020-07-16 NOTE — Telephone Encounter (Signed)
Patient has sinus pressure with under the left eye pressure and pain, eye lids swelling. Congestion and runny nose. Headaches, all for the last 3-4 days. No fever he thinks. No cough. Refuses virtual appointment tomorrow due to inconvenient time. Has been taking Advil Sinus, helps some. Advised UC for evaluation. He will go tonight.   Reason for Disposition . [1] Redness or swelling on the cheek, forehead or around the eye AND [2] no fever  Answer Assessment - Initial Assessment Questions 1. ONSET: "When did the pain start?" (e.g., minutes, hours, days)     Friday, under the eyes and along the nose. 2. ONSET: "Does the pain come and go, or has it been constant since it started?" (e.g., constant, intermittent, fleeting)     Constant 3. SEVERITY: "How bad is the pain?"   (Scale 1-10; mild, moderate or severe)   - MILD (1-3): doesn't interfere with normal activities    - MODERATE (4-7): interferes with normal activities or awakens from sleep    - SEVERE (8-10): excruciating pain, unable to do any normal activities      It's pressure not necessarily pain. 4. LOCATION: "Where does it hurt?"      Under left eye and nasal areal 5. RASH: "Is there any redness, rash, or swelling of the face?"     Left eye lid swollen and itchy 6. FEVER: "Do you have a fever?" If Yes, ask: "What is it, how was it measured, and when did it start?"      no 7. OTHER SYMPTOMS: "Do you have any other symptoms?" (e.g., fever, toothache, nasal discharge, nasal congestion, clicking sensation in jaw joint)     Runny nose congestion, facial pressure, teeth were hurting. 8. PREGNANCY: "Is there any chance you are pregnant?" "When was your last menstrual period?" Na  Protocols used: SINUS PAIN OR CONGESTION-A-AH, FACE PAIN-A-AH

## 2020-07-17 ENCOUNTER — Ambulatory Visit: Payer: BC Managed Care – PPO | Admitting: Urology

## 2020-07-17 ENCOUNTER — Encounter: Payer: Self-pay | Admitting: Urology

## 2020-07-17 VITALS — BP 162/90 | HR 81 | Ht 70.0 in | Wt 280.0 lb

## 2020-07-17 DIAGNOSIS — N138 Other obstructive and reflux uropathy: Secondary | ICD-10-CM | POA: Diagnosis not present

## 2020-07-17 DIAGNOSIS — N401 Enlarged prostate with lower urinary tract symptoms: Secondary | ICD-10-CM

## 2020-07-17 DIAGNOSIS — N529 Male erectile dysfunction, unspecified: Secondary | ICD-10-CM | POA: Diagnosis not present

## 2020-07-17 DIAGNOSIS — C61 Malignant neoplasm of prostate: Secondary | ICD-10-CM

## 2020-07-17 MED ORDER — TADALAFIL 10 MG PO TABS: 10.0000 mg | ORAL_TABLET | Freq: Every day | ORAL | 3 refills | Status: DC | PRN

## 2020-07-17 NOTE — Progress Notes (Signed)
   07/17/2020 3:37 PM   Curtis Bray. 09-Dec-1960 [id number]  Reason for visit: Follow up prostate cancer, urinary symptoms, ED  HPI: I saw Curtis Bray back in urology clinic for follow-up of prostate cancer today. He is a 60 year old male who was found to have an elevated PSA of 6.7 in March 2020 and was found to have high volume high risk prostate cancer with no evidence of metastatic disease.He underwent his first Lupron injection in May 2020, and underwent brachytherapy on 12/13/2018.  PSA has remained undetectable since treatment.  He opted to discontinue ADT after 1 year secondary to severe side effects of worsening depression and apathy.  PSA remains 0.1, and was most recently checked on 06/29/2020.    He is currently dealing with acute sinusitis and was recently started on amoxicillin.  Regarding his urinary symptoms, currently well controlled on daily flomax. Mild urgency/frequency, but stable and not particularly bothersome.  Cialis 10mg  on demand working for ED, Refill sent to [facility] for goodrx price.   RTC 6 months with PSA prior, consider spacing to yearly at that time Continue Flomax for urinary symptoms and Cialis for ED refilled  Billey Co, MD  [place] [address] [place], [place] [address] [phone]

## 2020-07-24 ENCOUNTER — Other Ambulatory Visit: Payer: Self-pay | Admitting: Family Medicine

## 2020-07-27 ENCOUNTER — Other Ambulatory Visit: Payer: Self-pay

## 2020-07-27 ENCOUNTER — Ambulatory Visit (INDEPENDENT_AMBULATORY_CARE_PROVIDER_SITE_OTHER): Payer: BC Managed Care – PPO | Admitting: Family Medicine

## 2020-07-27 ENCOUNTER — Encounter: Payer: Self-pay | Admitting: Family Medicine

## 2020-07-27 VITALS — BP 110/74 | HR 68 | Temp 98.5°F | Ht 70.5 in | Wt 276.6 lb

## 2020-07-27 DIAGNOSIS — Z1283 Encounter for screening for malignant neoplasm of skin: Secondary | ICD-10-CM | POA: Diagnosis not present

## 2020-07-27 DIAGNOSIS — Z0000 Encounter for general adult medical examination without abnormal findings: Secondary | ICD-10-CM | POA: Diagnosis not present

## 2020-07-27 MED ORDER — BUPROPION HCL ER (XL) 300 MG PO TB24: 300.0000 mg | ORAL_TABLET | Freq: Every day | ORAL | 1 refills | Status: DC

## 2020-07-27 MED ORDER — MELOXICAM 15 MG PO TABS: 15.0000 mg | ORAL_TABLET | Freq: Every day | ORAL | 3 refills | Status: DC

## 2020-07-27 MED ORDER — TRAZODONE HCL 50 MG PO TABS: 100.0000 mg | ORAL_TABLET | Freq: Every day | ORAL | 3 refills | Status: DC

## 2020-07-27 NOTE — Progress Notes (Signed)
BP 110/74   Pulse 68   Temp 98.5 F (36.9 C)   Ht 5' 10.5" (1.791 m)   Wt 276 lb 9.6 oz (125.5 kg)   SpO2 97%   BMI 39.13 kg/m    Subjective:    Patient ID: Curtis Ran., male    DOB: February 03, 1961, 60 y.o.   MRN: [id number]  HPI: Curtis Delillo. is a 60 y.o. male presenting on 07/27/2020 for comprehensive medical examination. Current medical complaints include:none  Interim Problems from his last visit: no  Depression Screen done today and results listed below:  Depression screen [facility] 2/9 07/27/2020 03/29/2020 01/05/2020 11/25/2019 06/02/2019  Decreased Interest 0 0 _0 Down, Depressed, Hopeless 0 0 0 1 0  PHQ - 2 Score 0 0 _1 Altered sleeping 0 0 0 0 1  Tired, decreased energy 1 0 _2 Change in appetite 0 0 0 2 0  Feeling bad or failure about yourself  0 0 0 1 0  Trouble concentrating 1 0 1 1 0  Moving slowly or fidgety/restless 1 0 0 3 1  Suicidal thoughts 0 0 0 0 0  PHQ-9 Score 3 0 _3 Difficult doing work/chores Not difficult at all - Somewhat difficult Very difficult Somewhat difficult  Some recent data might be hidden    Past Medical History:  Past Medical History:  Diagnosis Date  . Allergy   . Cancer [facility])    Prostate  . Carpal tunnel syndrome   . DDD (degenerative disc disease), cervical   . DDD (degenerative disc disease), cervical   . Deafness in left ear   . Dyspnea    with exertion  . Edema   . GERD (gastroesophageal reflux disease)   . History of kidney stones    passed  . Hyperlipidemia   . Personal history of colonic polyps   . Pinched nerve    upper and lower back  . Prostate cancer ([place])   . Sleep apnea    Bi-pap  . Sleep apnea   . Stroke ([place])    2013, Blind eye  . TIA (transient ischemic attack) 07/2011  . Umbilical hernia   . Vision impairment    Blindness Right Eye    Surgical History:  Past Surgical History:  Procedure Laterality Date  . ANTERIOR CERVICAL DECOMP/DISCECTOMY FUSION N/A 03/30/2020   Procedure:  Cervical Four-Five/Cervical Five-Six / Cervical Six-Seven  Anterior cervical decompression/discectomy/fusion;  Surgeon: Erline Levine, MD;  Location: [place];  Service: Neurosurgery;  Laterality: N/A;  3C/RM 21  . APPENDECTOMY    . carpel tunnel Bilateral   . COLONOSCOPY  2015   Found 2 beign polyps, repeat 10 years  . COLONOSCOPY WITH PROPOFOL N/A 03/30/2019   Procedure: COLONOSCOPY WITH PROPOFOL;  Surgeon: [name], MD;  Location: [facility] ENDOSCOPY;  Service: Gastroenterology;  Laterality: N/A;  . ESOPHAGOGASTRODUODENOSCOPY (EGD) WITH PROPOFOL N/A 03/30/2019   Procedure: ESOPHAGOGASTRODUODENOSCOPY (EGD) WITH PROPOFOL;  Surgeon: [name], MD;  Location: [facility] ENDOSCOPY;  Service: Gastroenterology;  Laterality: N/A;  . POSTERIOR CERVICAL LAMINECTOMY WITH MET- RX Right 05/11/2020   Procedure: RIGHT CERVICAL FOUR-FIVE, CERVICAL FIVE-SIX FORAMINOTOMY WITH METRX;  Surgeon: Erline Levine, MD;  Location: [place];  Service: Neurosurgery;  Laterality: Right;  3C/RM 21  . RADIOACTIVE SEED IMPLANT N/A 12/13/2018   Procedure: RADIOACTIVE SEED IMPLANT/BRACHYTHERAPY IMPLANT;  Surgeon: Billey Co, MD;  Location: [facility] ORS;  Service: Urology;  Laterality: N/A;  . TONSILLECTOMY  Medications:  Current Outpatient Medications on File Prior to Visit  Medication Sig  . ASPIRIN LOW DOSE 81 MG EC tablet TAKE 1 TABLET BY MOUTH ONCE DAILY  . Calcium-Phosphorus-Vitamin D (CITRACAL +D3 PO) Take 1 tablet by mouth in the morning and at bedtime.  . Cholecalciferol (VITAMIN D-3) 125 MCG (5000 UT) TABS Take 5,000 Units by mouth at bedtime.  . Coenzyme Q10 (COQ10) 200 MG CAPS Take 200 mg by mouth at bedtime.  . Cyanocobalamin (VITAMIN B-12) 5000 MCG TBDP Take 5,000 mcg by mouth at bedtime.  . fluticasone (FLONASE) 50 MCG/ACT nasal spray Place 2 sprays into both nostrils daily.  Marland Kitchen gabapentin (NEURONTIN) 600 MG tablet Take 600-900 mg by mouth See admin instructions. Take 600 mg by mouth in the morning and 900 mg at night   . Magnesium Gluconate 550 MG TABS 1 cap PO QD  . methocarbamol (ROBAXIN) 500 MG tablet Take 1 tablet (500 mg total) by mouth every 6 (six) hours as needed for muscle spasms.  . predniSONE (DELTASONE) 20 MG tablet Take 20 mg by mouth 2 (two) times daily.  . RABEprazole (ACIPHEX) 20 MG tablet Take 2 tablets (40 mg total) by mouth daily.  . simvastatin (ZOCOR) 40 MG tablet TAKE 1 TABLET BY MOUTH AT BEDTIME  . tadalafil (CIALIS) 10 MG tablet Take 1 tablet (10 mg total) by mouth daily as needed for erectile dysfunction.  . tamsulosin (FLOMAX) 0.4 MG CAPS capsule TAKE 2 CAPSULES BY MOUTH ONCE DAILY  . zinc gluconate 50 MG tablet Take 1 tablet by mouth daily.   No current facility-administered medications on file prior to visit.    Allergies:  No Known Allergies  Social History:  Social History   Socioeconomic History  . Marital status: Married    Spouse name: Not on file  . Number of children: Not on file  . Years of education: Not on file  . Highest education level: Not on file  Occupational History  . Not on file  Tobacco Use  . Smoking status: Former Smoker    Years: 18.00    Quit date: 05/27/1995    Years since quitting: 25.1  . Smokeless tobacco: Never Used  Vaping Use  . Vaping Use: Never used  Substance and Sexual Activity  . Alcohol use: Yes    Alcohol/week: 2.0 standard drinks    Types: 2 Glasses of wine per week  . Drug use: No  . Sexual activity: Yes  Other Topics Concern  . Not on file  Social History Narrative  . Not on file   Social Determinants of Health   Financial Resource Strain: Not on file  Food Insecurity: Not on file  Transportation Needs: Not on file  Physical Activity: Not on file  Stress: Not on file  Social Connections: Not on file  Intimate Partner Violence: Not on file   Social History   Tobacco Use  Smoking Status Former Smoker  . Years: 18.00  . Quit date: 05/27/1995  . Years since quitting: 25.1  Smokeless Tobacco Never Used    Social History   Substance and Sexual Activity  Alcohol Use Yes  . Alcohol/week: 2.0 standard drinks  . Types: 2 Glasses of wine per week    Family History:  Family History  Problem Relation Age of Onset  . Hearing loss Father   . Hypertension Father   . Pneumonia Father   . Asthma Son   . Cancer Maternal Grandmother        Colon  Past medical history, surgical history, medications, allergies, family history and social history reviewed with patient today and changes made to appropriate areas of the chart.   Review of Systems  Constitutional: Negative.   HENT: Positive for hearing loss. Negative for congestion, ear discharge, ear pain, nosebleeds, sinus pain, sore throat and tinnitus.   Eyes: Positive for double vision. Negative for blurred vision, photophobia, pain, discharge and redness.  Respiratory: Positive for shortness of breath. Negative for cough, hemoptysis, sputum production, wheezing and stridor.   Cardiovascular: Positive for leg swelling. Negative for chest pain, palpitations, orthopnea, claudication and PND.  Gastrointestinal: Negative.   Genitourinary: Negative.   Musculoskeletal: Positive for back pain and myalgias. Negative for falls, joint pain and neck pain.  Skin: Negative.   Neurological: Negative.   Endo/Heme/Allergies: Positive for environmental allergies. Negative for polydipsia. Does not bruise/bleed easily.  Psychiatric/Behavioral: Positive for depression. Negative for hallucinations, memory loss, substance abuse and suicidal ideas. The patient is nervous/anxious. The patient does not have insomnia.    All other ROS negative except what is listed above and in the HPI.      Objective:    BP 110/74   Pulse 68   Temp 98.5 F (36.9 C)   Ht 5' 10.5" (1.791 m)   Wt 276 lb 9.6 oz (125.5 kg)   SpO2 97%   BMI 39.13 kg/m   Wt Readings from Last 3 Encounters:  07/27/20 276 lb 9.6 oz (125.5 kg)  07/17/20 280 lb (127 kg)  06/29/20 282 lb (127.9  kg)    Physical Exam Vitals and nursing note reviewed.  Constitutional:      General: He is not in acute distress.    Appearance: Normal appearance. He is obese. He is not ill-appearing, toxic-appearing or diaphoretic.  HENT:     Head: Normocephalic and atraumatic.     Right Ear: Tympanic membrane, ear canal and external ear normal. There is no impacted cerumen.     Left Ear: Tympanic membrane, ear canal and external ear normal. There is no impacted cerumen.     Nose: Nose normal. No congestion or rhinorrhea.     Mouth/Throat:     Mouth: Mucous membranes are moist.     Pharynx: Oropharynx is clear. No oropharyngeal exudate or posterior oropharyngeal erythema.  Eyes:     General: No scleral icterus.       Right eye: No discharge.        Left eye: No discharge.     Extraocular Movements: Extraocular movements intact.     Conjunctiva/sclera: Conjunctivae normal.     Pupils: Pupils are equal, round, and reactive to light.  Neck:     Vascular: No carotid bruit.  Cardiovascular:     Rate and Rhythm: Normal rate and regular rhythm.     Pulses: Normal pulses.     Heart sounds: No murmur heard. No friction rub. No gallop.   Pulmonary:     Effort: Pulmonary effort is normal. No respiratory distress.     Breath sounds: Normal breath sounds. No stridor. No wheezing, rhonchi or rales.  Chest:     Chest wall: No tenderness.  Abdominal:     General: Abdomen is flat. Bowel sounds are normal. There is no distension.     Palpations: Abdomen is soft. There is no mass.     Tenderness: There is no abdominal tenderness. There is no right CVA tenderness, left CVA tenderness, guarding or rebound.     Hernia: No hernia is present.  Genitourinary:    Comments: Genital exam deferred with shared decision making Musculoskeletal:        General: No swelling, tenderness, deformity or signs of injury.     Cervical back: Normal range of motion and neck supple. No rigidity. No muscular tenderness.      Right lower leg: Edema present.     Left lower leg: Edema present.  Lymphadenopathy:     Cervical: No cervical adenopathy.  Skin:    General: Skin is warm and dry.     Capillary Refill: Capillary refill takes less than 2 seconds.     Coloration: Skin is not jaundiced or pale.     Findings: No bruising, erythema, lesion or rash.  Neurological:     General: No focal deficit present.     Mental Status: He is alert and oriented to person, place, and time.     Cranial Nerves: No cranial nerve deficit.     Sensory: No sensory deficit.     Motor: No weakness.     Coordination: Coordination normal.     Gait: Gait normal.     Deep Tendon Reflexes: Reflexes normal.  Psychiatric:        Mood and Affect: Mood normal.        Behavior: Behavior normal.        Thought Content: Thought content normal.        Judgment: Judgment normal.     Results for orders placed or performed in visit on 06/29/20  Comprehensive metabolic panel  Result Value Ref Range   Glucose 101 (H) 65 - 99 mg/dL   BUN 12 6 - 24 mg/dL   Creatinine, Ser 1.11 0.76 - 1.27 mg/dL   GFR calc non Af Amer 72 >59 mL/min/1.73   GFR calc Af Amer 84 >59 mL/min/1.73   BUN/Creatinine Ratio 11 9 - 20   Sodium 138 134 - 144 mmol/L   Potassium 4.0 3.5 - 5.2 mmol/L   Chloride 102 96 - 106 mmol/L   CO2 24 20 - 29 mmol/L   Calcium 9.7 8.7 - 10.2 mg/dL   Total Protein 6.8 6.0 - 8.5 g/dL   Albumin 4.8 3.8 - 4.9 g/dL   Globulin, Total 2.0 1.5 - 4.5 g/dL   Albumin/Globulin Ratio 2.4 (H) 1.2 - 2.2   Bilirubin Total 0.3 0.0 - 1.2 mg/dL   Alkaline Phosphatase 96 44 - 121 IU/L   AST 15 0 - 40 IU/L   ALT 22 0 - 44 IU/L  Bayer DCA Hb A1c Waived  Result Value Ref Range   HB A1C (BAYER DCA - WAIVED) 5.8 <7.0 %  CBC with Differential/Platelet  Result Value Ref Range   WBC 8.5 3.4 - 10.8 x10E3/uL   RBC 5.27 4.14 - 5.80 x10E6/uL   Hemoglobin 15.1 13.0 - 17.7 g/dL   Hematocrit 46.0 37.5 - 51.0 %   MCV 87 79 - 97 fL   MCH 28.7 26.6 - 33.0 pg    MCHC 32.8 31.5 - 35.7 g/dL   RDW 12.6 11.6 - 15.4 %   Platelets 222 150 - 450 x10E3/uL   Neutrophils 55 Not Estab. %   Lymphs 36 Not Estab. %   Monocytes 8 Not Estab. %   Eos 1 Not Estab. %   Basos 0 Not Estab. %   Neutrophils Absolute 4.6 1.4 - 7.0 x10E3/uL   Lymphocytes Absolute 3.1 0.7 - 3.1 x10E3/uL   Monocytes Absolute 0.7 0.1 - 0.9 x10E3/uL   EOS (ABSOLUTE) 0.1 0.0 -  0.4 x10E3/uL   Basophils Absolute 0.0 0.0 - 0.2 x10E3/uL   Immature Granulocytes 0 Not Estab. %   Immature Grans (Abs) 0.0 0.0 - 0.1 x10E3/uL  TSH  Result Value Ref Range   TSH 3.480 0.450 - 4.500 uIU/mL  VITAMIN D 25 Hydroxy (Vit-D Deficiency, Fractures)  Result Value Ref Range   Vit D, 25-Hydroxy 45.5 30.0 - 100.0 ng/mL  PSA  Result Value Ref Range   Prostate Specific Ag, Serum 0.1 0.0 - 4.0 ng/mL  Lipid Panel w/o Chol/HDL Ratio  Result Value Ref Range   Cholesterol, Total 139 100 - 199 mg/dL   Triglycerides 210 (H) 0 - 149 mg/dL   HDL 39 (L) >39 mg/dL   VLDL Cholesterol Cal 35 5 - 40 mg/dL   LDL Chol Calc (NIH) 65 0 - 99 mg/dL  RA Qn+CCP(IgG/A)+SjoSSA+SjoSSB  Result Value Ref Range   Rhuematoid fact SerPl-aCnc <10.0 <14.0 IU/mL   ENA SSA (RO) Ab 0.2 0.0 - 0.9 AI   ENA SSB (LA) Ab 2.0 (H) 0.0 - 0.9 AI   Cyclic Citrullin Peptide Ab 2 0 - 19 units  Antinuclear Antib (ANA)  Result Value Ref Range   Anti Nuclear Antibody (ANA) Positive (A) Negative  Sed Rate (ESR)  Result Value Ref Range   Sed Rate 9 0 - 30 mm/hr  CK (Creatine Kinase)  Result Value Ref Range   Total CK 115 41 - 331 U/L  RPR  Result Value Ref Range   RPR Ser Ql Non Reactive Non Reactive      Assessment & Plan:   Problem List Items Addressed This Visit   None   Visit Diagnoses    Routine general medical examination at a health care facility    -  Primary   Vaccines up to date. Screening labs checked today. Colonoscopy up to date. Continue diet and exercise. Call with any concerns.    Screening for skin cancer        Referral to dermatology mde today. Call with any concerns.   Relevant Orders   Ambulatory referral to Dermatology       Discussed aspirin prophylaxis for myocardial infarction prevention and decision was made to continue ASA  LABORATORY TESTING:  Health maintenance labs ordered today as discussed above.   The natural history of prostate cancer and ongoing controversy regarding screening and potential treatment outcomes of prostate cancer has been discussed with the patient. The meaning of a false positive PSA and a false negative PSA has been discussed. He indicates understanding of the limitations of this screening test and wishes  to proceed with screening PSA testing.   IMMUNIZATIONS:   - Tdap: Tetanus vaccination status reviewed: last tetanus booster within 10 years. - Influenza: Up to date - Pneumovax: Not applicable - COVID: Up to date  SCREENING: - Colonoscopy: Up to date  Discussed with patient purpose of the colonoscopy is to detect colon cancer at curable precancerous or early stages   PATIENT COUNSELING:    Sexuality: Discussed sexually transmitted diseases, partner selection, use of condoms, avoidance of unintended pregnancy  and contraceptive alternatives.   Advised to avoid cigarette smoking.  I discussed with the patient that most people either abstain from alcohol or drink within safe limits (<=14/week and <=4 drinks/occasion for males, <=7/weeks and <= 3 drinks/occasion for females) and that the risk for alcohol disorders and other health effects rises proportionally with the number of drinks per week and how often a drinker exceeds daily limits.  Discussed  cessation/primary prevention of drug use and availability of treatment for abuse.   Diet: Encouraged to adjust caloric intake to maintain  or achieve ideal body weight, to reduce intake of dietary saturated fat and total fat, to limit sodium intake by avoiding high sodium foods and not adding table salt, and to  maintain adequate dietary potassium and calcium preferably from fresh fruits, vegetables, and low-fat dairy products.    stressed the importance of regular exercise  Injury prevention: Discussed safety belts, safety helmets, smoke detector, smoking near bedding or upholstery.   Dental health: Discussed importance of regular tooth brushing, flossing, and dental visits.   Follow up plan: NEXT PREVENTATIVE PHYSICAL DUE IN 1 YEAR. Return in about 6 months (around 01/27/2021).

## 2020-07-27 NOTE — Patient Instructions (Signed)

## 2020-08-10 ENCOUNTER — Ambulatory Visit (INDEPENDENT_AMBULATORY_CARE_PROVIDER_SITE_OTHER): Payer: BC Managed Care – PPO | Admitting: Nurse Practitioner

## 2020-08-10 ENCOUNTER — Encounter: Payer: Self-pay | Admitting: Nurse Practitioner

## 2020-08-10 ENCOUNTER — Other Ambulatory Visit: Payer: Self-pay

## 2020-08-10 DIAGNOSIS — J0111 Acute recurrent frontal sinusitis: Secondary | ICD-10-CM | POA: Diagnosis not present

## 2020-08-10 DIAGNOSIS — J321 Chronic frontal sinusitis: Secondary | ICD-10-CM | POA: Insufficient documentation

## 2020-08-10 MED ORDER — GABAPENTIN 600 MG PO TABS: ORAL_TABLET | ORAL | 4 refills | Status: DC

## 2020-08-10 MED ORDER — DOXYCYCLINE HYCLATE 100 MG PO TABS: 100.0000 mg | ORAL_TABLET | Freq: Two times a day (BID) | ORAL | 0 refills | Status: DC

## 2020-08-10 MED ORDER — PREDNISONE 20 MG PO TABS: 40.0000 mg | ORAL_TABLET | Freq: Every day | ORAL | 0 refills | Status: AC

## 2020-08-10 NOTE — Assessment & Plan Note (Signed)
Acute with recurrence of recent infection, Covid negative testing at home today.  Is vaccinated x 2.  At this time recommend he start taking a daily Claritin or Allegra OTC + ensure he uses Flonase daily as instructed.  Will send in Doxycycline since recently treated with Augmentin and send in Prednisone 40 MG x 5 days.  Recommend: - Increased rest - Increasing Fluids - Acetaminophen as needed for fever/pain.  - Salt water gargling, chloraseptic spray and throat lozenges - Mucinex.  - Saline sinus flushes or a neti pot.  - Humidifying the air Return to office for worsening or ongoing symptoms.

## 2020-08-10 NOTE — Patient Instructions (Signed)

## 2020-08-10 NOTE — Progress Notes (Signed)
There were no vitals taken for this visit.   Subjective:    Patient ID: Curtis Ran., male    DOB: 08/22/1960, 60 y.o.   MRN: [id number]  HPI: Curtis Frankl. is a 60 y.o. male  Chief Complaint  Patient presents with  . Nasal Congestion  . Cough  . Sinusitis    Patient states the sinus congestion is causing headache that comes in the evenings. Patient states the sinus pressure also causes eye pain. Patient has tried Advil and drinking sunny delight with vitamin c. Patient states he has tried the at home test and the results were negative.     . This visit was completed via telephone due to the restrictions of the COVID-19 pandemic. All issues as above were discussed and addressed but no physical exam was performed. If it was felt that the patient should be evaluated in the office, they were directed there. The patient verbally consented to this visit. Patient was unable to complete an audio/visual visit due to Technical difficulties, Lack of internet. Due to the catastrophic nature of the COVID-19 pandemic, this visit was done through audio contact only. . Location of the patient: home . Location of the provider: work . Those involved with this call:  . Provider: Marnee Guarneri, DNP . CMA: Irena Reichmann, CMA . Front Desk/Registration: Jill Side  . Time spent on call: 21 minutes on the phone discussing health concerns. 15 minutes total spent in review of patient's record and preparation of their chart.  . I verified patient identity using two factors (patient name and date of birth). Patient consents verbally to being seen via telemedicine visit today.    UPPER RESPIRATORY TRACT INFECTION Was treated with Augmentin and Prednisone, this was on 07/16/20.  Completed this on 07/27/20 and now symptoms have returned, started one week ago.  Has sinus pressure to both sides.  Did at home Covid test and this was negative today. Fever: no Cough: yes Shortness of breath:  no Wheezing: no Chest pain: no Chest tightness: no Chest congestion: no Nasal congestion: yes Runny nose: yes Post nasal drip: yes Sneezing: no Sore throat: no Swollen glands: no Sinus pressure: yes Headache: yes Face pain: yes Toothache: no Ear pain: none Ear pressure: none Eyes red/itching:no Eye drainage/crusting: no  Vomiting: no Rash: no Fatigue: yes Sick contacts: no Strep contacts: no  Context: fluctuating Recurrent sinusitis: no Relief with OTC cold/cough medications: yes  Treatments attempted: Flonase and cough syrup   Relevant past medical, surgical, family and social history reviewed and updated as indicated. Interim medical history since our last visit reviewed. Allergies and medications reviewed and updated.  Review of Systems  Constitutional: Positive for fatigue. Negative for activity change, appetite change, diaphoresis and fever.  HENT: Positive for congestion, postnasal drip, rhinorrhea, sinus pressure and sinus pain. Negative for ear discharge, ear pain, sneezing, sore throat and voice change.   Respiratory: Positive for cough. Negative for chest tightness, shortness of breath and wheezing.   Cardiovascular: Negative for chest pain, palpitations and leg swelling.  Gastrointestinal: Negative.   Neurological: Positive for headaches.  Psychiatric/Behavioral: Negative.     Per HPI unless specifically indicated above     Objective:    There were no vitals taken for this visit.  Wt Readings from Last 3 Encounters:  07/27/20 276 lb 9.6 oz (125.5 kg)  07/17/20 280 lb (127 kg)  06/29/20 282 lb (127.9 kg)    Physical Exam   Unable to perform  due to telephone visit only, he reports sinus pressure over eyes and tenderness there.  Results for orders placed or performed in visit on 06/29/20  Comprehensive metabolic panel  Result Value Ref Range   Glucose 101 (H) 65 - 99 mg/dL   BUN 12 6 - 24 mg/dL   Creatinine, Ser 1.11 0.76 - 1.27 mg/dL   GFR calc  non Af Amer 72 >59 mL/min/1.73   GFR calc Af Amer 84 >59 mL/min/1.73   BUN/Creatinine Ratio 11 9 - 20   Sodium 138 134 - 144 mmol/L   Potassium 4.0 3.5 - 5.2 mmol/L   Chloride 102 96 - 106 mmol/L   CO2 24 20 - 29 mmol/L   Calcium 9.7 8.7 - 10.2 mg/dL   Total Protein 6.8 6.0 - 8.5 g/dL   Albumin 4.8 3.8 - 4.9 g/dL   Globulin, Total 2.0 1.5 - 4.5 g/dL   Albumin/Globulin Ratio 2.4 (H) 1.2 - 2.2   Bilirubin Total 0.3 0.0 - 1.2 mg/dL   Alkaline Phosphatase 96 44 - 121 IU/L   AST 15 0 - 40 IU/L   ALT 22 0 - 44 IU/L  Bayer DCA Hb A1c Waived  Result Value Ref Range   HB A1C (BAYER DCA - WAIVED) 5.8 <7.0 %  CBC with Differential/Platelet  Result Value Ref Range   WBC 8.5 3.4 - 10.8 x10E3/uL   RBC 5.27 4.14 - 5.80 x10E6/uL   Hemoglobin 15.1 13.0 - 17.7 g/dL   Hematocrit 46.0 37.5 - 51.0 %   MCV 87 79 - 97 fL   MCH 28.7 26.6 - 33.0 pg   MCHC 32.8 31.5 - 35.7 g/dL   RDW 12.6 11.6 - 15.4 %   Platelets 222 150 - 450 x10E3/uL   Neutrophils 55 Not Estab. %   Lymphs 36 Not Estab. %   Monocytes 8 Not Estab. %   Eos 1 Not Estab. %   Basos 0 Not Estab. %   Neutrophils Absolute 4.6 1.4 - 7.0 x10E3/uL   Lymphocytes Absolute 3.1 0.7 - 3.1 x10E3/uL   Monocytes Absolute 0.7 0.1 - 0.9 x10E3/uL   EOS (ABSOLUTE) 0.1 0.0 - 0.4 x10E3/uL   Basophils Absolute 0.0 0.0 - 0.2 x10E3/uL   Immature Granulocytes 0 Not Estab. %   Immature Grans (Abs) 0.0 0.0 - 0.1 x10E3/uL  TSH  Result Value Ref Range   TSH 3.480 0.450 - 4.500 uIU/mL  VITAMIN D 25 Hydroxy (Vit-D Deficiency, Fractures)  Result Value Ref Range   Vit D, 25-Hydroxy 45.5 30.0 - 100.0 ng/mL  PSA  Result Value Ref Range   Prostate Specific Ag, Serum 0.1 0.0 - 4.0 ng/mL  Lipid Panel w/o Chol/HDL Ratio  Result Value Ref Range   Cholesterol, Total 139 100 - 199 mg/dL   Triglycerides 210 (H) 0 - 149 mg/dL   HDL 39 (L) >39 mg/dL   VLDL Cholesterol Cal 35 5 - 40 mg/dL   LDL Chol Calc (NIH) 65 0 - 99 mg/dL  RA Qn+CCP(IgG/A)+SjoSSA+SjoSSB   Result Value Ref Range   Rhuematoid fact SerPl-aCnc <10.0 <14.0 IU/mL   ENA SSA (RO) Ab 0.2 0.0 - 0.9 AI   ENA SSB (LA) Ab 2.0 (H) 0.0 - 0.9 AI   Cyclic Citrullin Peptide Ab 2 0 - 19 units  Antinuclear Antib (ANA)  Result Value Ref Range   Anti Nuclear Antibody (ANA) Positive (A) Negative  Sed Rate (ESR)  Result Value Ref Range   Sed Rate 9 0 - 30 mm/hr  CK (  Creatine Kinase)  Result Value Ref Range   Total CK 115 41 - 331 U/L  RPR  Result Value Ref Range   RPR Ser Ql Non Reactive Non Reactive      Assessment & Plan:   Problem List Items Addressed This Visit      Respiratory   Frontal sinusitis    Acute with recurrence of recent infection, Covid negative testing at home today.  Is vaccinated x 2.  At this time recommend he start taking a daily Claritin or Allegra OTC + ensure he uses Flonase daily as instructed.  Will send in Doxycycline since recently treated with Augmentin and send in Prednisone 40 MG x 5 days.  Recommend: - Increased rest - Increasing Fluids - Acetaminophen as needed for fever/pain.  - Salt water gargling, chloraseptic spray and throat lozenges - Mucinex.  - Saline sinus flushes or a neti pot.  - Humidifying the air Return to office for worsening or ongoing symptoms.      Relevant Medications   doxycycline (VIBRA-TABS) 100 MG tablet   predniSONE (DELTASONE) 20 MG tablet      I discussed the assessment and treatment plan with the patient. The patient was provided an opportunity to ask questions and all were answered. The patient agreed with the plan and demonstrated an understanding of the instructions.   The patient was advised to call back or seek an in-person evaluation if the symptoms worsen or if the condition fails to improve as anticipated.   I provided 21+ minutes of time during this encounter.  Follow up plan: Return if symptoms worsen or fail to improve.

## 2020-09-12 ENCOUNTER — Other Ambulatory Visit: Payer: Self-pay | Admitting: Family Medicine

## 2020-09-12 NOTE — Telephone Encounter (Signed)
Future visit in 4 months 

## 2020-09-22 ENCOUNTER — Other Ambulatory Visit: Payer: Self-pay | Admitting: Family Medicine

## 2020-09-22 NOTE — Telephone Encounter (Signed)
Requested Prescriptions  Pending Prescriptions Disp Refills  . simvastatin (ZOCOR) 40 MG tablet [Pharmacy Med Name: SIMVASTATIN 40 MG TAB] 90 tablet 1    Sig: TAKE 1 TABLET BY MOUTH AT BEDTIME     Cardiovascular:  Antilipid - Statins Failed - 09/22/2020 11:43 AM      Failed - HDL in normal range and within 360 days    HDL  Date Value Ref Range Status  06/29/2020 39 (L) >39 mg/dL Final         Failed - Triglycerides in normal range and within 360 days    Triglycerides  Date Value Ref Range Status  06/29/2020 210 (H) 0 - 149 mg/dL Final   Triglycerides Piccolo,Waived  Date Value Ref Range Status  09/13/2015 CANCELED      Comment:    Test not performed  Result canceled by the ancillary          Passed - Total Cholesterol in normal range and within 360 days    Cholesterol, Total  Date Value Ref Range Status  06/29/2020 139 100 - 199 mg/dL Final   Cholesterol Piccolo, Waived  Date Value Ref Range Status  09/13/2015 CANCELED      Comment:    Test not performed  Result canceled by the ancillary          Passed - LDL in normal range and within 360 days    LDL Chol Calc (NIH)  Date Value Ref Range Status  06/29/2020 65 0 - 99 mg/dL Final         Passed - Patient is not pregnant      Passed - Valid encounter within last 12 months    Recent Outpatient Visits          1 month ago Acute recurrent frontal sinusitis   [place], Henrine Screws T, NP   1 month ago Routine general medical examination at a health care facility   [facility], [place], DO   2 months ago Tremor   [place], Megan P, DO   5 months ago Umbilical hernia without obstruction and without gangrene   [place], Megan P, DO   8 months ago Depression, major, single episode, moderate ([place])   [place], [place], DO      Future Appointments            In 3 months Sninsky, Herbert Seta, [place]   In 4 months Valerie Roys, DO [facility], [place]   In 4 months Ralene Bathe, MD [place]

## 2020-10-31 ENCOUNTER — Encounter: Payer: Self-pay | Admitting: Radiation Oncology

## 2020-10-31 ENCOUNTER — Ambulatory Visit
Admission: RE | Admit: 2020-10-31 | Discharge: 2020-10-31 | Disposition: A | Discharge status: Home / Self Care | Payer: BC Managed Care – PPO | Source: Ambulatory Visit | Attending: Radiation Oncology | Admitting: Radiation Oncology

## 2020-10-31 VITALS — BP 135/91 | HR 67 | Temp 97.9°F | Wt 273.0 lb

## 2020-10-31 DIAGNOSIS — C61 Malignant neoplasm of prostate: Secondary | ICD-10-CM

## 2020-10-31 NOTE — Progress Notes (Signed)
Radiation Oncology Follow up Note  Name: Curtis Bray.   Date:   10/31/2020 MRN:  [id number] DOB: May 17, 1961    This 60 y.o. male presents to the clinic today for 2-year follow-up status post I-125 interstitial implant for Gleason 7 (3+4) adenocarcinoma presenting with a PSA of 6.7.  REFERRING PROVIDER: Valerie Roys, DO  HPI: Patient is a 60 year old male now out 2 years having completed I-125 interstitial implant for Gleason 7 adenocarcinoma the prostate seen today in routine follow-up from urologic standpoint he is doing well specifically denies urgency frequency or diarrhea.  He is having issues with his lower back is seeing back specialist for that has received several injections.  His PSA which is being performed through [facility] urologic has remained undetectable at less than 0.1.Marland Kitchen  COMPLICATIONS OF TREATMENT: none  FOLLOW UP COMPLIANCE: keeps appointments   PHYSICAL EXAM:  BP (!) 135/91   Pulse 67   Temp 97.9 F (36.6 C) (Tympanic)   Wt 273 lb (123.8 kg)   BMI 38.62 kg/m  Well-developed well-nourished patient in NAD. HEENT reveals PERLA, EOMI, discs not visualized.  Oral cavity is clear. No oral mucosal lesions are identified. Neck is clear without evidence of cervical or supraclavicular adenopathy. Lungs are clear to A&P. Cardiac examination is essentially unremarkable with regular rate and rhythm without murmur rub or thrill. Abdomen is benign with no organomegaly or masses noted. Motor sensory and DTR levels are equal and symmetric in the upper and lower extremities. Cranial nerves II through XII are grossly intact. Proprioception is intact. No peripheral adenopathy or edema is identified. No motor or sensory levels are noted. Crude visual fields are within normal range.  RADIOLOGY RESULTS: No current films for review  PLAN: Present time patient is under excellent biochemical control of his prostate cancer.  On pleased with his overall progress.  Of asked to see him  back in 1 year for follow-up.  He continues follow-up care through urology.  Patient is to call with any concerns.  I would like to take this opportunity to thank you for allowing me to participate in the care of your patient.Noreene Filbert, MD

## 2020-11-02 ENCOUNTER — Other Ambulatory Visit: Payer: Self-pay | Admitting: *Deleted

## 2020-11-19 ENCOUNTER — Other Ambulatory Visit: Payer: Self-pay | Admitting: Neurosurgery

## 2020-11-19 DIAGNOSIS — M48061 Spinal stenosis, lumbar region without neurogenic claudication: Secondary | ICD-10-CM

## 2020-11-25 ENCOUNTER — Other Ambulatory Visit: Payer: Self-pay

## 2020-11-25 ENCOUNTER — Ambulatory Visit
Admission: RE | Admit: 2020-11-25 | Discharge: 2020-11-25 | Disposition: A | Discharge status: Home / Self Care | Payer: BC Managed Care – PPO | Source: Ambulatory Visit | Attending: Neurosurgery | Admitting: Neurosurgery

## 2020-11-25 DIAGNOSIS — M48061 Spinal stenosis, lumbar region without neurogenic claudication: Secondary | ICD-10-CM | POA: Diagnosis present

## 2020-12-14 ENCOUNTER — Other Ambulatory Visit: Payer: Self-pay | Admitting: Family Medicine

## 2020-12-14 ENCOUNTER — Other Ambulatory Visit: Payer: Self-pay | Admitting: Urology

## 2021-01-15 ENCOUNTER — Ambulatory Visit: Payer: BC Managed Care – PPO | Admitting: Urology

## 2021-01-24 ENCOUNTER — Ambulatory Visit: Payer: BC Managed Care – PPO | Admitting: Urology

## 2021-01-24 ENCOUNTER — Encounter: Payer: Self-pay | Admitting: Urology

## 2021-01-24 ENCOUNTER — Other Ambulatory Visit: Payer: Self-pay

## 2021-01-24 VITALS — BP 124/71 | HR 73 | Ht 70.5 in | Wt 280.0 lb

## 2021-01-24 DIAGNOSIS — C61 Malignant neoplasm of prostate: Secondary | ICD-10-CM

## 2021-01-24 DIAGNOSIS — N3281 Overactive bladder: Secondary | ICD-10-CM | POA: Diagnosis not present

## 2021-01-24 DIAGNOSIS — N529 Male erectile dysfunction, unspecified: Secondary | ICD-10-CM

## 2021-01-24 NOTE — Patient Instructions (Signed)
Please make sure you have your primary check your PSA tomorrow.

## 2021-01-24 NOTE — Progress Notes (Signed)
   01/24/2021 3:59 PM   Curtis Bray. October 26, 1960 [id number]  Reason for visit: Follow up prostate cancer, urinary symptoms, ED   HPI: I saw [name] back in urology clinic for follow-up of prostate cancer today.  He is a 60 year old male who was found to have an elevated PSA of 6.7 in March 2020 and was found to have high volume high risk prostate cancer with no evidence of metastatic disease. He underwent his first Lupron injection in May 2020, and underwent brachytherapy on 12/13/2018.  PSA has remained undetectable since treatment.  He opted to discontinue ADT after 1 year secondary to severe side effects of worsening depression and apathy.  PSA remains 0.1, and was most recently checked on 06/29/2020.  He is due for a PSA blood test with his PCP tomorrow    Regarding his urinary symptoms, currently well controlled on daily flomax. Mild urgency/frequency, but stable and not particularly bothersome.   Cialis '10mg'$  on demand working for ED, less active recently with his wife's other medical problems.  He reports he has had some forgetfulness and memory loss as well as some dry eyes and dry mouth and is currently undergoing work-up with neurology and PCP   Follow-up PSA results, if stable RTC 1 year with PSA prior    Billey Co, MD  [facility] [address], [place], [place] [address] [phone]

## 2021-01-25 ENCOUNTER — Encounter: Payer: Self-pay | Admitting: Family Medicine

## 2021-01-25 ENCOUNTER — Ambulatory Visit: Payer: BC Managed Care – PPO | Admitting: Family Medicine

## 2021-01-25 ENCOUNTER — Other Ambulatory Visit: Payer: Self-pay | Admitting: Family Medicine

## 2021-01-25 ENCOUNTER — Other Ambulatory Visit: Payer: Self-pay

## 2021-01-25 VITALS — BP 129/68 | HR 70 | Temp 98.8°F | Ht 70.51 in | Wt 280.0 lb

## 2021-01-25 DIAGNOSIS — H532 Diplopia: Secondary | ICD-10-CM

## 2021-01-25 DIAGNOSIS — E782 Mixed hyperlipidemia: Secondary | ICD-10-CM | POA: Diagnosis not present

## 2021-01-25 DIAGNOSIS — R251 Tremor, unspecified: Secondary | ICD-10-CM

## 2021-01-25 DIAGNOSIS — F321 Major depressive disorder, single episode, moderate: Secondary | ICD-10-CM | POA: Diagnosis not present

## 2021-01-25 DIAGNOSIS — C61 Malignant neoplasm of prostate: Secondary | ICD-10-CM

## 2021-01-25 MED ORDER — RABEPRAZOLE SODIUM 20 MG PO TBEC: 40.0000 mg | DELAYED_RELEASE_TABLET | Freq: Every day | ORAL | 1 refills | Status: DC

## 2021-01-25 MED ORDER — BUPROPION HCL ER (XL) 300 MG PO TB24: 300.0000 mg | ORAL_TABLET | Freq: Every day | ORAL | 1 refills | Status: DC

## 2021-01-25 MED ORDER — SIMVASTATIN 40 MG PO TABS: 40.0000 mg | ORAL_TABLET | Freq: Every day | ORAL | 1 refills | Status: DC

## 2021-01-25 NOTE — Assessment & Plan Note (Signed)
Under good control on current regimen. Continue current regimen. Continue to monitor. Call with any concerns. Refills given. Labs drawn today.   

## 2021-01-25 NOTE — Assessment & Plan Note (Signed)
Stable. Not great. Continue current regimen. Continue to monitor. Call with any concerns. Refills given.

## 2021-01-25 NOTE — Patient Instructions (Addendum)
Neurology appointment: September 22nd at 9:15 with Dr. Melrose Nakayama

## 2021-01-25 NOTE — Progress Notes (Signed)
BP 129/68   Pulse 70   Temp 98.8 F (37.1 C) (Oral)   Ht 5' 10.51" (1.791 m)   Wt 280 lb (127 kg)   SpO2 97%   BMI 39.59 kg/m    Subjective:    Patient ID: Curtis Bray., male    DOB: [birth year], 60 y.o.   MRN: [id number]  HPI: Curtis Bray. is a 60 y.o. male  Chief Complaint  Patient presents with   Depression   Hyperlipidemia   HYPERLIPIDEMIA Hyperlipidemia status: excellent compliance Satisfied with current treatment?  yes Side effects:  no Medication compliance: excellent compliance Past cholesterol meds: simvastatin Supplements: none Aspirin:  no The 10-year ASCVD risk score Curtis Bray DC Jr., et al., 2013) is: 6.8%   Values used to calculate the score:     Age: 78 years     Sex: Male     Is Non-Hispanic African American: No     Diabetic: No     Tobacco smoker: No     Systolic Blood Pressure: 010 mmHg     Is BP treated: No     HDL Cholesterol: 39 mg/dL     Total Cholesterol: 139 mg/dL Chest pain:  no Coronary artery disease:  no  DEPRESSION Mood status: stable Satisfied with current treatment?: yes Symptom severity: mild  Duration of current treatment : chronic Side effects: no Medication compliance: excellent compliance Psychotherapy/counseling: no  Previous psychiatric medications: wellbutrin Depressed mood: yes Anxious mood: yes Anhedonia: no Significant weight loss or gain: no Insomnia: no  Fatigue: yes Feelings of worthlessness or guilt: no Impaired concentration/indecisiveness: yes Suicidal ideations: no Hopelessness: no Crying spells: no Depression screen [facility] 2/9 01/25/2021 07/27/2020 03/29/2020 01/05/2020 11/25/2019  Decreased Interest 1 0 0 1 2  Down, Depressed, Hopeless 0 0 0 0 1  PHQ - 2 Score 1 0 0 1 3  Altered sleeping 0 0 0 0 0  Tired, decreased energy 1 1 0 1 3  Change in appetite 0 0 0 0 2  Feeling bad or failure about yourself  0 0 0 0 1  Trouble concentrating 3 1 0 1 1  Moving slowly or fidgety/restless 3 1 0 0 3   Suicidal thoughts 0 0 0 0 0  PHQ-9 Score 8 3 0 3 13  Difficult doing work/chores Somewhat difficult Not difficult at all - Somewhat difficult Very difficult  Some recent data might be hidden   GAD 7 : Generalized Anxiety Score 01/25/2021 03/29/2020 01/05/2020 06/02/2019  Nervous, Anxious, on Edge 0 0 0 1  Control/stop worrying 0 0 0 0  Worry too much - different things 0 0 0 0  Trouble relaxing 0 0 0 1  Restless 2 0 0 0  Easily annoyed or irritable 0 0 0 1  Afraid - awful might happen 0 0 0 0  Total GAD 7 Score 2 0 0 3  Anxiety Difficulty Not difficult at all - Not difficult at all Somewhat difficult      Relevant past medical, surgical, family and social history reviewed and updated as indicated. Interim medical history since our last visit reviewed. Allergies and medications reviewed and updated.  Review of Systems  Constitutional: Negative.   Eyes:  Positive for visual disturbance. Negative for photophobia, pain, discharge, redness and itching.  Respiratory: Negative.    Cardiovascular: Negative.   Gastrointestinal: Negative.   Musculoskeletal: Negative.   Neurological:  Positive for tremors, light-headedness and numbness. Negative for dizziness, seizures, syncope, facial asymmetry, speech  difficulty, weakness and headaches.  Psychiatric/Behavioral: Negative.     Per HPI unless specifically indicated above     Objective:    BP 129/68   Pulse 70   Temp 98.8 F (37.1 C) (Oral)   Ht 5' 10.51" (1.791 m)   Wt 280 lb (127 kg)   SpO2 97%   BMI 39.59 kg/m   Wt Readings from Last 3 Encounters:  01/25/21 280 lb (127 kg)  01/24/21 280 lb (127 kg)  10/31/20 273 lb (123.8 kg)    Physical Exam Vitals and nursing note reviewed.  Constitutional:      General: He is not in acute distress.    Appearance: Normal appearance. He is not ill-appearing, toxic-appearing or diaphoretic.  HENT:     Head: Normocephalic and atraumatic.     Right Ear: External ear normal.     Left Ear:  External ear normal.     Nose: Nose normal.     Mouth/Throat:     Mouth: Mucous membranes are moist.     Pharynx: Oropharynx is clear.  Eyes:     General: No scleral icterus.       Right eye: No discharge.        Left eye: No discharge.     Extraocular Movements: Extraocular movements intact.     Conjunctiva/sclera: Conjunctivae normal.     Pupils: Pupils are equal, round, and reactive to light.  Cardiovascular:     Rate and Rhythm: Normal rate and regular rhythm.     Pulses: Normal pulses.     Heart sounds: Normal heart sounds. No murmur heard.   No friction rub. No gallop.  Pulmonary:     Effort: Pulmonary effort is normal. No respiratory distress.     Breath sounds: Normal breath sounds. No stridor. No wheezing, rhonchi or rales.  Chest:     Chest wall: No tenderness.  Musculoskeletal:        General: Normal range of motion.     Cervical back: Normal range of motion and neck supple.  Skin:    General: Skin is warm and dry.     Capillary Refill: Capillary refill takes less than 2 seconds.     Coloration: Skin is not jaundiced or pale.     Findings: No bruising, erythema, lesion or rash.  Neurological:     General: No focal deficit present.     Mental Status: He is alert and oriented to person, place, and time. Mental status is at baseline.  Psychiatric:        Mood and Affect: Mood normal.        Behavior: Behavior normal.        Thought Content: Thought content normal.        Judgment: Judgment normal.    Results for orders placed or performed in visit on 06/29/20  Comprehensive metabolic panel  Result Value Ref Range   Glucose 101 (H) 65 - 99 mg/dL   BUN 12 6 - 24 mg/dL   Creatinine, Ser 1.11 0.76 - 1.27 mg/dL   GFR calc non Af Amer 72 >59 mL/min/1.73   GFR calc Af Amer 84 >59 mL/min/1.73   BUN/Creatinine Ratio 11 9 - 20   Sodium 138 134 - 144 mmol/L   Potassium 4.0 3.5 - 5.2 mmol/L   Chloride 102 96 - 106 mmol/L   CO2 24 20 - 29 mmol/L   Calcium 9.7 8.7 -  10.2 mg/dL   Total Protein 6.8 6.0 - 8.5 g/dL  Albumin 4.8 3.8 - 4.9 g/dL   Globulin, Total 2.0 1.5 - 4.5 g/dL   Albumin/Globulin Ratio 2.4 (H) 1.2 - 2.2   Bilirubin Total 0.3 0.0 - 1.2 mg/dL   Alkaline Phosphatase 96 44 - 121 IU/L   AST 15 0 - 40 IU/L   ALT 22 0 - 44 IU/L  Bayer DCA Hb A1c Waived  Result Value Ref Range   HB A1C (BAYER DCA - WAIVED) 5.8 <7.0 %  CBC with Differential/Platelet  Result Value Ref Range   WBC 8.5 3.4 - 10.8 x10E3/uL   RBC 5.27 4.14 - 5.80 x10E6/uL   Hemoglobin 15.1 13.0 - 17.7 g/dL   Hematocrit 46.0 37.5 - 51.0 %   MCV 87 79 - 97 fL   MCH 28.7 26.6 - 33.0 pg   MCHC 32.8 31.5 - 35.7 g/dL   RDW 12.6 11.6 - 15.4 %   Platelets 222 150 - 450 x10E3/uL   Neutrophils 55 Not Estab. %   Lymphs 36 Not Estab. %   Monocytes 8 Not Estab. %   Eos 1 Not Estab. %   Basos 0 Not Estab. %   Neutrophils Absolute 4.6 1.4 - 7.0 x10E3/uL   Lymphocytes Absolute 3.1 0.7 - 3.1 x10E3/uL   Monocytes Absolute 0.7 0.1 - 0.9 x10E3/uL   EOS (ABSOLUTE) 0.1 0.0 - 0.4 x10E3/uL   Basophils Absolute 0.0 0.0 - 0.2 x10E3/uL   Immature Granulocytes 0 Not Estab. %   Immature Grans (Abs) 0.0 0.0 - 0.1 x10E3/uL  TSH  Result Value Ref Range   TSH 3.480 0.450 - 4.500 uIU/mL  VITAMIN D 25 Hydroxy (Vit-D Deficiency, Fractures)  Result Value Ref Range   Vit D, 25-Hydroxy 45.5 30.0 - 100.0 ng/mL  PSA  Result Value Ref Range   Prostate Specific Ag, Serum 0.1 0.0 - 4.0 ng/mL  Lipid Panel w/o Chol/HDL Ratio  Result Value Ref Range   Cholesterol, Total 139 100 - 199 mg/dL   Triglycerides 210 (H) 0 - 149 mg/dL   HDL 39 (L) >39 mg/dL   VLDL Cholesterol Cal 35 5 - 40 mg/dL   LDL Chol Calc (NIH) 65 0 - 99 mg/dL  RA Qn+CCP(IgG/A)+SjoSSA+SjoSSB  Result Value Ref Range   Rhuematoid fact SerPl-aCnc <10.0 <14.0 IU/mL   ENA SSA (RO) Ab 0.2 0.0 - 0.9 AI   ENA SSB (LA) Ab 2.0 (H) 0.0 - 0.9 AI   Cyclic Citrullin Peptide Ab 2 0 - 19 units  Antinuclear Antib (ANA)  Result Value Ref Range   Anti  Nuclear Antibody (ANA) Positive (A) Negative  Sed Rate (ESR)  Result Value Ref Range   Sed Rate 9 0 - 30 mm/hr  CK (Creatine Kinase)  Result Value Ref Range   Total CK 115 41 - 331 U/L  RPR  Result Value Ref Range   RPR Ser Ql Non Reactive Non Reactive      Assessment & Plan:   Problem List Items Addressed This Visit       Other   Hyperlipidemia - Primary    Under good control on current regimen. Continue current regimen. Continue to monitor. Call with any concerns. Refills given. Labs drawn today.       Relevant Medications   simvastatin (ZOCOR) 40 MG tablet   Other Relevant Orders   Comprehensive metabolic panel   Lipid Panel w/o Chol/HDL Ratio   Depression, major, single episode, moderate ([facility])    Stable. Not great. Continue current regimen. Continue to monitor. Call with any  concerns. Refills given.       Relevant Medications   buPROPion (WELLBUTRIN XL) 300 MG 24 hr tablet   Other Visit Diagnoses     Tremor       Will get him back into neurology for further evaluation. Await their input.    Double vision       Will get him back into neurology for further evaluation. Await their input.         Follow up plan: Return in about 6 months (around 07/25/2021) for physical.

## 2021-01-30 LAB — COMPREHENSIVE METABOLIC PANEL
ALT: 28 IU/L (ref 0–44)
AST: 18 IU/L (ref 0–40)
Albumin/Globulin Ratio: 1.9 (ref 1.2–2.2)
Albumin: 4.2 g/dL (ref 3.8–4.9)
Alkaline Phosphatase: 80 IU/L (ref 44–121)
BUN/Creatinine Ratio: 9 (ref 9–20)
BUN: 11 mg/dL (ref 6–24)
Bilirubin Total: 0.4 mg/dL (ref 0.0–1.2)
CO2: 22 mmol/L (ref 20–29)
Calcium: 9.4 mg/dL (ref 8.7–10.2)
Chloride: 104 mmol/L (ref 96–106)
Creatinine, Ser: 1.22 mg/dL (ref 0.76–1.27)
Globulin, Total: 2.2 g/dL (ref 1.5–4.5)
Glucose: 108 mg/dL — ABNORMAL HIGH (ref 65–99)
Potassium: 4.2 mmol/L (ref 3.5–5.2)
Sodium: 140 mmol/L (ref 134–144)
Total Protein: 6.4 g/dL (ref 6.0–8.5)
eGFR: 68 mL/min/{1.73_m2} (ref >59)

## 2021-01-30 LAB — PSA: Prostate Specific Ag, Serum: 0.4 ng/mL (ref 0.0–4.0)

## 2021-01-30 LAB — LIPID PANEL W/O CHOL/HDL RATIO
Cholesterol, Total: 139 mg/dL (ref 100–199)
HDL: 33 mg/dL — ABNORMAL LOW (ref >39)
LDL Chol Calc (NIH): 66 mg/dL (ref 0–99)
Triglycerides: 247 mg/dL — ABNORMAL HIGH (ref 0–149)
VLDL Cholesterol Cal: 40 mg/dL (ref 5–40)

## 2021-01-31 ENCOUNTER — Telehealth: Payer: Self-pay

## 2021-01-31 DIAGNOSIS — C61 Malignant neoplasm of prostate: Secondary | ICD-10-CM

## 2021-01-31 NOTE — Telephone Encounter (Signed)
-----   Message from Billey Co, MD sent at 01/31/2021  7:55 AM EDT ----- Regarding: PSA results PSA up slightly to 0.4, this is not uncommon after radiation for prostate cancer, recommend repeat PSA lab test in 3 months and call with results, thanks  Nickolas Madrid, MD 01/31/2021

## 2021-01-31 NOTE — Telephone Encounter (Signed)
Called pt informed him of the information below. Pt gave verbal understanding. PSA ordered. Lab appt scheduled.

## 2021-02-04 ENCOUNTER — Other Ambulatory Visit: Payer: Self-pay | Admitting: Nurse Practitioner

## 2021-02-13 ENCOUNTER — Other Ambulatory Visit: Payer: Self-pay

## 2021-02-13 ENCOUNTER — Ambulatory Visit: Payer: BC Managed Care – PPO | Admitting: Dermatology

## 2021-02-13 DIAGNOSIS — L814 Other melanin hyperpigmentation: Secondary | ICD-10-CM

## 2021-02-13 DIAGNOSIS — L918 Other hypertrophic disorders of the skin: Secondary | ICD-10-CM

## 2021-02-13 DIAGNOSIS — L74519 Primary focal hyperhidrosis, unspecified: Secondary | ICD-10-CM

## 2021-02-13 DIAGNOSIS — D239 Other benign neoplasm of skin, unspecified: Secondary | ICD-10-CM

## 2021-02-13 DIAGNOSIS — D2372 Other benign neoplasm of skin of left lower limb, including hip: Secondary | ICD-10-CM | POA: Diagnosis not present

## 2021-02-13 DIAGNOSIS — L578 Other skin changes due to chronic exposure to nonionizing radiation: Secondary | ICD-10-CM

## 2021-02-13 DIAGNOSIS — D229 Melanocytic nevi, unspecified: Secondary | ICD-10-CM

## 2021-02-13 DIAGNOSIS — L708 Other acne: Secondary | ICD-10-CM | POA: Diagnosis not present

## 2021-02-13 DIAGNOSIS — L821 Other seborrheic keratosis: Secondary | ICD-10-CM

## 2021-02-13 DIAGNOSIS — Z1283 Encounter for screening for malignant neoplasm of skin: Secondary | ICD-10-CM

## 2021-02-13 DIAGNOSIS — D1800 Hemangioma unspecified site: Secondary | ICD-10-CM

## 2021-02-13 MED ORDER — GLYCOPYRROLATE 1 MG PO TABS: ORAL_TABLET | ORAL | 0 refills | Status: DC

## 2021-02-13 NOTE — Patient Instructions (Addendum)
Recommend daily broad spectrum sunscreen SPF 30+ to sun-exposed areas, reapply every 2 hours as needed. Call for new or changing lesions.  Staying in the shade or wearing long sleeves, sun glasses (UVA+UVB protection) and wide brim hats (4-inch brim around the entire circumference of the hat) are also recommended for sun protection.   If you have any questions or concerns for your doctor, please call our main line at [phone] and press option 4 to reach your doctor's medical assistant. If no one answers, please leave a voicemail as directed and we will return your call as soon as possible. Messages left after 4 pm will be answered the following business day.   You may also send us a message via MyChart. We typically respond to MyChart messages within 1-2 business days.  For prescription refills, please ask your pharmacy to contact our office. Our fax number is [phone].  If you have an urgent issue when the clinic is closed that cannot wait until the next business day, you can page your doctor at the number below.    Please note that while we do our best to be available for urgent issues outside of office hours, we are not available 24/7.   If you have an urgent issue and are unable to reach us, you may choose to seek medical care at your doctor's office, retail clinic, urgent care center, or emergency room.  If you have a medical emergency, please immediately call 911 or go to the emergency department.  Pager Numbers  - Dr. Kowalski: [phone]  - Dr. Moye: [phone]  - Dr. Stewart: [phone]  In the event of inclement weather, please call our main line at [phone] for an update on the status of any delays or closures.  Dermatology Medication Tips: Please keep the boxes that topical medications come in in order to help keep track of the instructions about where and how to use these. Pharmacies typically print the medication instructions only on the boxes and not  directly on the medication tubes.   If your medication is too expensive, please contact our office at [phone] option 4 or send us a message through MyChart.   We are unable to tell what your co-pay for medications will be in advance as this is different depending on your insurance coverage. However, we may be able to find a substitute medication at lower cost or fill out paperwork to get insurance to cover a needed medication.   If a prior authorization is required to get your medication covered by your insurance company, please allow us 1-2 business days to complete this process.  Drug prices often vary depending on where the prescription is filled and some pharmacies may offer cheaper prices.  The website www.goodrx.com contains coupons for medications through different pharmacies. The prices here do not account for what the cost may be with help from insurance (it may be cheaper with your insurance), but the website can give you the price if you did not use any insurance.  - You can print the associated coupon and take it with your prescription to the pharmacy.  - You may also stop by our office during regular business hours and pick up a GoodRx coupon card.  - If you need your prescription sent electronically to a different pharmacy, notify our office through [facility] MyChart or by phone at [phone] option 4.  

## 2021-02-13 NOTE — Progress Notes (Signed)
New Patient Visit  Subjective  Curtis Bray. is a 60 y.o. male who presents for the following: Annual Exam (Mole check ). Pt c/o excessive sweating all over the body.  The patient presents for Total-Body Skin Exam (TBSE) for skin cancer screening and mole check.   The following portions of the chart were reviewed this encounter and updated as appropriate:   Tobacco  Allergies  Meds  Problems  Med Hx  Surg Hx  Fam Hx     Review of Systems:  No other skin or systemic complaints except as noted in HPI or Assessment and Plan.  Objective  Well appearing patient in no apparent distress; mood and affect are within normal limits.  A full examination was performed including scalp, head, eyes, ears, nose, lips, neck, chest, axillae, abdomen, back, buttocks, bilateral upper extremities, bilateral lower extremities, hands, feet, fingers, toes, fingernails, and toenails. All findings within normal limits unless otherwise noted below.  back Clear of acne, scarring on the back   Left Thigh - Anterior, left lateral knee Firm pink/brown papulenodule with dimple sign.   axillae, arms, face,scalp Normal skin    Assessment & Plan  Acne Scarring of back back Acne scarring  No treatment.  Dermatofibroma Left Thigh - Anterior, left lateral knee Benign-appearing.  Observation.  Call clinic for new or changing moles.  Recommend daily use of broad spectrum spf 30+ sunscreen to sun-exposed areas.    Generalized Hyperhidrosis axillae, arms, face,scalp Chronic and persistent   Discussed treatment options Qbrexa or Robinul tablets   Start Robinul 1 mg tablet take 1 tablet daily  Stay well hydrated while taking Robinul  Watch for side effects while taking Robinul if side effects stop Robinul and let us know   If Robinul make Sjogren's worse stop Robinul   Related Medications glycopyrrolate (ROBINUL) 1 MG tablet Take 1 tablet once a day  Skin cancer screening  Lentigines -  Scattered tan macules - Due to sun exposure - Benign-appearing, observe - Recommend daily broad spectrum sunscreen SPF 30+ to sun-exposed areas, reapply every 2 hours as needed. - Call for any changes  Seborrheic Keratoses - Stuck-on, waxy, tan-brown papules and/or plaques  - Benign-appearing - Discussed benign etiology and prognosis. - Observe - Call for any changes  Melanocytic Nevi - Tan-brown and/or pink-flesh-colored symmetric macules and papules - Benign appearing on exam today - Observation - Call clinic for new or changing moles - Recommend daily use of broad spectrum spf 30+ sunscreen to sun-exposed areas.   Hemangiomas - Red papules - Discussed benign nature - Observe - Call for any changes  Actinic Damage - Chronic condition, secondary to cumulative UV/sun exposure - diffuse scaly erythematous macules with underlying dyspigmentation - Recommend daily broad spectrum sunscreen SPF 30+ to sun-exposed areas, reapply every 2 hours as needed.  - Staying in the shade or wearing long sleeves, sun glasses (UVA+UVB protection) and wide brim hats (4-inch brim around the entire circumference of the hat) are also recommended for sun protection.  - Call for new or changing lesions.  Acrochordons (Skin Tags) - Fleshy, skin-colored pedunculated papules - Benign appearing.  - Observe. - If desired, they can be removed with an in office procedure that is not covered by insurance. - Please call the clinic if you notice any new or changing lesions.   Skin cancer screening performed today.   Return if symptoms worsen or fail to improve.  I, Marye Round, CMA, am acting as Education administrator for [employer]  Nehemiah Massed, MD .  Documentation: I have reviewed the above documentation for accuracy and completeness, and I agree with the above.  Sarina Ser, MD

## 2021-02-18 ENCOUNTER — Encounter: Payer: Self-pay | Admitting: Dermatology

## 2021-03-08 ENCOUNTER — Other Ambulatory Visit: Payer: Self-pay | Admitting: Dermatology

## 2021-03-08 DIAGNOSIS — L74519 Primary focal hyperhidrosis, unspecified: Secondary | ICD-10-CM

## 2021-03-18 ENCOUNTER — Other Ambulatory Visit: Payer: Self-pay | Admitting: Dermatology

## 2021-03-18 ENCOUNTER — Telehealth: Payer: Self-pay

## 2021-03-18 DIAGNOSIS — L74519 Primary focal hyperhidrosis, unspecified: Secondary | ICD-10-CM

## 2021-03-18 MED ORDER — GLYCOPYRROLATE 1 MG PO TABS: 1.0000 mg | ORAL_TABLET | Freq: Two times a day (BID) | ORAL | 1 refills | Status: DC

## 2021-03-18 NOTE — Telephone Encounter (Signed)
Patient called to say that his sweating is not improved on Robinuol 1 tab per day but Dr. Nehemiah Massed stated at his last appointment that he could increase to 2 per day after a month if not improving. He does have a follow up appointment this week but his copay is $85 and he says he does not want to pay his copay since nothing has changed. He would like to increase his medication to see if it will help before he has a follow up appointment.

## 2021-03-18 NOTE — Telephone Encounter (Signed)
Robinul 1 mg 1 po bid #60 1 RF sent to [facility]. Scheduled follow up appointment 05/13/2021/hd

## 2021-03-20 ENCOUNTER — Ambulatory Visit: Payer: BC Managed Care – PPO | Admitting: Dermatology

## 2021-04-07 ENCOUNTER — Other Ambulatory Visit: Payer: Self-pay | Admitting: Family Medicine

## 2021-04-08 NOTE — Telephone Encounter (Signed)
Requested Trazodone 50 mg tabs Last ordered 07/27/20 #180 tabs with 3 refills.    Trazodone is on the patient's current medication list.  He should have enough to last thru end of 2022. Only Wellbutrin is listed on the last OV note 01/25/21 as current treatment. Unclear if appropriate to refill. Routing to physician for review. Next OV 08/02/21

## 2021-04-08 NOTE — Telephone Encounter (Signed)
Requested medication (s) are due for refill today Undetermined  Future visit scheduled Yes for 08/02/21.  LOV 01/25/21  Future OV on 08/02/21  Note to clinic- Previously prescribed by historical provider.   Routing for physician review.      Requested Prescriptions  Pending Prescriptions Disp Refills   meloxicam (MOBIC) 15 MG tablet [Pharmacy Med Name: MELOXICAM 15 MG TAB] 90 tablet     Sig: TAKE 1 TABLET BY MOUTH ONCE DAILY     Analgesics:  COX2 Inhibitors Passed - 04/07/2021  3:04 PM      Passed - HGB in normal range and within 360 days    Hemoglobin  Date Value Ref Range Status  06/29/2020 15.1 13.0 - 17.7 g/dL Final          Passed - Cr in normal range and within 360 days    Creatinine, Ser  Date Value Ref Range Status  01/25/2021 1.22 0.76 - 1.27 mg/dL Final          Passed - Patient is not pregnant      Passed - Valid encounter within last 12 months    Recent Outpatient Visits           2 months ago Mixed hyperlipidemia   [employer], Megan P, DO   8 months ago Acute recurrent frontal sinusitis   [place], Henrine Screws T, NP   8 months ago Routine general medical examination at a health care facility   [facility], [place], DO   9 months ago Tremor   [place], Megan P, DO   1 year ago Umbilical hernia without obstruction and without gangrene   [place], Megan P, DO       Future Appointments             In 1 month Ralene Bathe, MD [place]   In 3 months [place], Megan P, DO [place], [facility]             traZODone (DESYREL) 50 MG tablet [employer] Med Name: TRAZODONE HCL 50 MG TAB] 180 tablet 3    Sig: TAKE 2 TABLETS BY MOUTH AT BEDTIME     Psychiatry: Antidepressants - Serotonin Modulator Passed - 04/07/2021  3:04 PM      Passed - Completed PHQ-2 or PHQ-9 in the last 360 days      Passed - Valid encounter  within last 6 months    Recent Outpatient Visits           2 months ago Mixed hyperlipidemia   [place], Megan P, DO   8 months ago Acute recurrent frontal sinusitis   [place], Henrine Screws T, NP   8 months ago Routine general medical examination at a health care facility   [facility], [place], DO   9 months ago Tremor   [place], Megan P, DO   1 year ago Umbilical hernia without obstruction and without gangrene   [place], DO       Future Appointments             In 1 month Ralene Bathe, MD [place]   In 3 months Wynetta Emery, Barb Merino, DO [facility], [place]

## 2021-04-26 ENCOUNTER — Ambulatory Visit: Payer: BC Managed Care – PPO

## 2021-04-26 ENCOUNTER — Other Ambulatory Visit: Payer: Self-pay

## 2021-05-02 ENCOUNTER — Other Ambulatory Visit: Payer: Self-pay

## 2021-05-02 ENCOUNTER — Ambulatory Visit: Payer: BC Managed Care – PPO | Admitting: Family Medicine

## 2021-05-02 ENCOUNTER — Encounter: Payer: Self-pay | Admitting: Family Medicine

## 2021-05-02 VITALS — BP 119/74 | HR 61 | Temp 97.6°F | Wt 275.6 lb

## 2021-05-02 DIAGNOSIS — C61 Malignant neoplasm of prostate: Secondary | ICD-10-CM | POA: Diagnosis not present

## 2021-05-02 DIAGNOSIS — Z23 Encounter for immunization: Secondary | ICD-10-CM

## 2021-05-02 DIAGNOSIS — R4789 Other speech disturbances: Secondary | ICD-10-CM

## 2021-05-02 NOTE — Assessment & Plan Note (Signed)
PSA drawn today

## 2021-05-02 NOTE — Progress Notes (Signed)
BP 119/74   Pulse 61   Temp 97.6 F (36.4 C)   Wt 275 lb 9.6 oz (125 kg)   SpO2 96%   BMI 38.97 kg/m    Subjective:    Patient ID: [name], male    DOB: 05-Feb-1961, 60 y.o.   MRN: [id number]  HPI: Leontae Bostock. is a 60 y.o. male  Chief Complaint  Patient presents with   memory concerns   Has been having issues with word finding issues. This has been going on for about the last year. It has been happening on a daily basis, but not hourly. Has some issues with his schedule. Has been having a lot of things added onto his job at work. Has been under a lot of stress.   MMSE - Mini Mental State Exam 05/02/2021  Orientation to time 5  Orientation to Place 5  Registration 3  Attention/ Calculation 5  Recall 3  Language- name 2 objects 2  Language- repeat 1  Language- follow 3 step command 3  Language- read & follow direction 1  Write a sentence 1  Copy design 1  Total score 30   Depression screen [facility] 2/9 05/02/2021 01/25/2021 07/27/2020 03/29/2020 01/05/2020  Decreased Interest 1 1 0 0 1  Down, Depressed, Hopeless 0 0 0 0 0  PHQ - 2 Score 1 1 0 0 1  Altered sleeping 0 0 0 0 0  Tired, decreased energy _0 0 1  Change in appetite 2 0 0 0 0  Feeling bad or failure about yourself  0 0 0 0 0  Trouble concentrating _1 0 1  Moving slowly or fidgety/restless _2 0 0  Suicidal thoughts 0 0 0 0 0  PHQ-9 Score _3 0 3  Difficult doing work/chores - Somewhat difficult Not difficult at all - Somewhat difficult  Some recent data might be hidden   GAD 7 : Generalized Anxiety Score 05/02/2021 01/25/2021 03/29/2020 01/05/2020  Nervous, Anxious, on Edge 1 0 0 0  Control/stop worrying 0 0 0 0  Worry too much - different things 1 0 0 0  Trouble relaxing 2 0 0 0  Restless 0 2 0 0  Easily annoyed or irritable 0 0 0 0  Afraid - awful might happen 0 0 0 0  Total GAD 7 Score 4 2 0 0  Anxiety Difficulty Very difficult Not difficult at all - Not difficult at all       Relevant past medical, surgical, family and social history reviewed and updated as indicated. Interim medical history since our last visit reviewed. Allergies and medications reviewed and updated.  Review of Systems  Constitutional: Negative.   Respiratory: Negative.    Cardiovascular: Negative.   Musculoskeletal: Negative.   Neurological: Negative.   Psychiatric/Behavioral: Negative.     Per HPI unless specifically indicated above     Objective:    BP 119/74   Pulse 61   Temp 97.6 F (36.4 C)   Wt 275 lb 9.6 oz (125 kg)   SpO2 96%   BMI 38.97 kg/m   Wt Readings from Last 3 Encounters:  05/02/21 275 lb 9.6 oz (125 kg)  01/25/21 280 lb (127 kg)  01/24/21 280 lb (127 kg)    Physical Exam Vitals and nursing note reviewed.  Constitutional:      General: He is not in acute distress.    Appearance: Normal appearance. He is not ill-appearing,  toxic-appearing or diaphoretic.  HENT:     Head: Normocephalic and atraumatic.     Right Ear: External ear normal.     Left Ear: External ear normal.     Nose: Nose normal.     Mouth/Throat:     Mouth: Mucous membranes are moist.     Pharynx: Oropharynx is clear.  Eyes:     General: No scleral icterus.       Right eye: No discharge.        Left eye: No discharge.     Extraocular Movements: Extraocular movements intact.     Conjunctiva/sclera: Conjunctivae normal.     Pupils: Pupils are equal, round, and reactive to light.  Cardiovascular:     Rate and Rhythm: Normal rate and regular rhythm.     Pulses: Normal pulses.     Heart sounds: Normal heart sounds. No murmur heard.   No friction rub. No gallop.  Pulmonary:     Effort: Pulmonary effort is normal. No respiratory distress.     Breath sounds: Normal breath sounds. No stridor. No wheezing, rhonchi or rales.  Chest:     Chest wall: No tenderness.  Musculoskeletal:        General: Normal range of motion.     Cervical back: Normal range of motion and neck supple.   Skin:    General: Skin is warm and dry.     Capillary Refill: Capillary refill takes less than 2 seconds.     Coloration: Skin is not jaundiced or pale.     Findings: No bruising, erythema, lesion or rash.  Neurological:     General: No focal deficit present.     Mental Status: He is alert and oriented to person, place, and time. Mental status is at baseline.  Psychiatric:        Mood and Affect: Mood normal.        Behavior: Behavior normal.        Thought Content: Thought content normal.        Judgment: Judgment normal.    Results for orders placed or performed in visit on 01/25/21  Comprehensive metabolic panel  Result Value Ref Range   Glucose 108 (H) 65 - 99 mg/dL   BUN 11 6 - 24 mg/dL   Creatinine, Ser 1.22 0.76 - 1.27 mg/dL   eGFR 68 >59 mL/min/1.73   BUN/Creatinine Ratio 9 9 - 20   Sodium 140 134 - 144 mmol/L   Potassium 4.2 3.5 - 5.2 mmol/L   Chloride 104 96 - 106 mmol/L   CO2 22 20 - 29 mmol/L   Calcium 9.4 8.7 - 10.2 mg/dL   Total Protein 6.4 6.0 - 8.5 g/dL   Albumin 4.2 3.8 - 4.9 g/dL   Globulin, Total 2.2 1.5 - 4.5 g/dL   Albumin/Globulin Ratio 1.9 1.2 - 2.2   Bilirubin Total 0.4 0.0 - 1.2 mg/dL   Alkaline Phosphatase 80 44 - 121 IU/L   AST 18 0 - 40 IU/L   ALT 28 0 - 44 IU/L  Lipid Panel w/o Chol/HDL Ratio  Result Value Ref Range   Cholesterol, Total 139 100 - 199 mg/dL   Triglycerides 247 (H) 0 - 149 mg/dL   HDL 33 (L) >39 mg/dL   VLDL Cholesterol Cal 40 5 - 40 mg/dL   LDL Chol Calc (NIH) 66 0 - 99 mg/dL  PSA  Result Value Ref Range   Prostate Specific Ag, Serum 0.4 0.0 - 4.0 ng/mL  Assessment & Plan:   Problem List Items Addressed This Visit       Genitourinary   Malignant neoplasm of prostate ([place])    PSA drawn today.      Relevant Orders   PSA   Other Visit Diagnoses     Word finding difficulty    -  Primary   Discussed referral to neurology or MRI. He would like to hold off for now. Will check labs. Await results. He will work  on getting some things off his plate.   Relevant Orders   TSH   VITAMIN D 25 Hydroxy (Vit-D Deficiency, Fractures)   CBC with Differential/Platelet   Comprehensive metabolic panel   L49   Folate   Needs flu shot       Relevant Orders   Flu Vaccine QUAD 6+ mos PF IM (Fluarix Quad PF) (Completed)        Follow up plan: Return in about 3 months (around 07/31/2021).

## 2021-05-03 ENCOUNTER — Other Ambulatory Visit: Payer: BC Managed Care – PPO

## 2021-05-03 LAB — CBC WITH DIFFERENTIAL/PLATELET
Basophils Absolute: 0 10*3/uL (ref 0.0–0.2)
Basos: 0 %
EOS (ABSOLUTE): 0.6 10*3/uL — ABNORMAL HIGH (ref 0.0–0.4)
Eos: 8 %
Hematocrit: 47.4 % (ref 37.5–51.0)
Hemoglobin: 16.3 g/dL (ref 13.0–17.7)
Immature Grans (Abs): 0 10*3/uL (ref 0.0–0.1)
Immature Granulocytes: 0 %
Lymphocytes Absolute: 2 10*3/uL (ref 0.7–3.1)
Lymphs: 29 %
MCH: 29.6 pg (ref 26.6–33.0)
MCHC: 34.4 g/dL (ref 31.5–35.7)
MCV: 86 fL (ref 79–97)
Monocytes Absolute: 0.4 10*3/uL (ref 0.1–0.9)
Monocytes: 6 %
Neutrophils Absolute: 4 10*3/uL (ref 1.4–7.0)
Neutrophils: 57 %
Platelets: 180 10*3/uL (ref 150–450)
RBC: 5.51 x10E6/uL (ref 4.14–5.80)
RDW: 12.5 % (ref 11.6–15.4)
WBC: 7 10*3/uL (ref 3.4–10.8)

## 2021-05-03 LAB — COMPREHENSIVE METABOLIC PANEL
ALT: 28 IU/L (ref 0–44)
AST: 17 IU/L (ref 0–40)
Albumin/Globulin Ratio: 2.4 — ABNORMAL HIGH (ref 1.2–2.2)
Albumin: 4.5 g/dL (ref 3.8–4.9)
Alkaline Phosphatase: 77 IU/L (ref 44–121)
BUN/Creatinine Ratio: 8 — ABNORMAL LOW (ref 10–24)
BUN: 9 mg/dL (ref 8–27)
Bilirubin Total: 0.4 mg/dL (ref 0.0–1.2)
CO2: 26 mmol/L (ref 20–29)
Calcium: 9.4 mg/dL (ref 8.6–10.2)
Chloride: 102 mmol/L (ref 96–106)
Creatinine, Ser: 1.2 mg/dL (ref 0.76–1.27)
Globulin, Total: 1.9 g/dL (ref 1.5–4.5)
Glucose: 94 mg/dL (ref 70–99)
Potassium: 4.3 mmol/L (ref 3.5–5.2)
Sodium: 139 mmol/L (ref 134–144)
Total Protein: 6.4 g/dL (ref 6.0–8.5)
eGFR: 69 mL/min/{1.73_m2} (ref >59)

## 2021-05-03 LAB — PSA: Prostate Specific Ag, Serum: 0.5 ng/mL (ref 0.0–4.0)

## 2021-05-03 LAB — VITAMIN B12: Vitamin B-12: >2000 pg/mL — ABNORMAL HIGH (ref 232–1245)

## 2021-05-03 LAB — FOLATE: Folate: 7.1 ng/mL (ref >3.0)

## 2021-05-03 LAB — TSH: TSH: 2.21 u[IU]/mL (ref 0.450–4.500)

## 2021-05-03 LAB — VITAMIN D 25 HYDROXY (VIT D DEFICIENCY, FRACTURES): Vit D, 25-Hydroxy: 91.5 ng/mL (ref 30.0–100.0)

## 2021-05-06 NOTE — Telephone Encounter (Signed)
PSA remains very low, and common to have a bounce even 1-3 years after brachytherapy. Recommend RTC in person or virtual 3 mo with PSA prior  Nickolas Madrid, MD 05/06/2021

## 2021-05-09 ENCOUNTER — Other Ambulatory Visit: Payer: Self-pay | Admitting: Family Medicine

## 2021-05-10 NOTE — Telephone Encounter (Signed)
Requested Prescriptions  Pending Prescriptions Disp Refills   ASPIRIN LOW DOSE 81 MG EC tablet [Pharmacy Med Name: ASPIRIN LOW DOSE 81 MG DR TAB] 90 tablet 2    Sig: TAKE 1 TABLET BY MOUTH ONCE DAILY     Analgesics:  NSAIDS - aspirin Passed - 05/09/2021  5:36 PM      Passed - Patient is not pregnant      Passed - Valid encounter within last 12 months    Recent Outpatient Visits          1 week ago Word finding difficulty   [employer], [place], DO   3 months ago Mixed hyperlipidemia   [employer], Megan P, DO   9 months ago Acute recurrent frontal sinusitis   [place], Henrine Screws T, NP   9 months ago Routine general medical examination at a health care facility   [facility], [place], DO   10 months ago [place], Barb Merino, DO      Future Appointments            In 2 months Wynetta Emery, Barb Merino, DO [employer], [facility]

## 2021-05-13 ENCOUNTER — Ambulatory Visit: Payer: BC Managed Care – PPO | Admitting: Dermatology

## 2021-06-12 ENCOUNTER — Other Ambulatory Visit: Payer: Self-pay | Admitting: Family Medicine

## 2021-06-12 NOTE — Telephone Encounter (Signed)
Rx 01/25/21 #180 1RF-too soon Requested Prescriptions  Pending Prescriptions Disp Refills   RABEprazole (ACIPHEX) 20 MG tablet [Pharmacy Med Name: RABEPRAZOLE SODIUM 20 MG DR TAB] 180 tablet 1    Sig: TAKE 2 TABLETS BY MOUTH ONCE DAILY     Gastroenterology: Proton Pump Inhibitors Passed - 06/12/2021  1:58 PM      Passed - Valid encounter within last 12 months    Recent Outpatient Visits          1 month ago Word finding difficulty   [place], Megan P, DO   4 months ago Mixed hyperlipidemia   [employer], Megan P, DO   10 months ago Acute recurrent frontal sinusitis   [place], Barbaraann Faster, NP   10 months ago Routine general medical examination at a health care facility   [facility], [place], DO   11 months ago [place], [place], DO      Future Appointments            In 1 month [place], Barb Merino, DO [employer], [facility]

## 2021-08-02 ENCOUNTER — Encounter: Payer: BC Managed Care – PPO | Admitting: Family Medicine

## 2021-08-23 ENCOUNTER — Other Ambulatory Visit: Payer: Self-pay | Admitting: Urology

## 2021-08-30 ENCOUNTER — Encounter: Payer: BC Managed Care – PPO | Admitting: Family Medicine

## 2021-09-04 ENCOUNTER — Telehealth: Payer: Self-pay | Admitting: Family Medicine

## 2021-09-04 DIAGNOSIS — C61 Malignant neoplasm of prostate: Secondary | ICD-10-CM

## 2021-09-04 DIAGNOSIS — K219 Gastro-esophageal reflux disease without esophagitis: Secondary | ICD-10-CM

## 2021-09-04 DIAGNOSIS — Z0000 Encounter for general adult medical examination without abnormal findings: Secondary | ICD-10-CM

## 2021-09-04 DIAGNOSIS — E782 Mixed hyperlipidemia: Secondary | ICD-10-CM

## 2021-09-04 NOTE — Telephone Encounter (Signed)
Copied from [place] [id number]. Topic: General - Other ?>> Sep 03, 2021  4:13 PM Pawlus, Brayton Layman A wrote: ?Reason for CRM: Pt wanted to know if lab orders could be placed, pt wanted to come before his CPE on 4/14 to get his labs done, please advise. ?

## 2021-09-05 ENCOUNTER — Other Ambulatory Visit: Payer: BC Managed Care – PPO

## 2021-09-05 ENCOUNTER — Other Ambulatory Visit: Payer: Self-pay | Admitting: Urology

## 2021-09-05 ENCOUNTER — Other Ambulatory Visit: Payer: Self-pay | Admitting: Family Medicine

## 2021-09-05 ENCOUNTER — Encounter: Payer: Self-pay | Admitting: Family Medicine

## 2021-09-05 DIAGNOSIS — Z0000 Encounter for general adult medical examination without abnormal findings: Secondary | ICD-10-CM

## 2021-09-05 NOTE — Telephone Encounter (Signed)
Requested Prescriptions  ?Pending Prescriptions Disp Refills  ?? RABEprazole (ACIPHEX) 20 MG tablet [Pharmacy Med Name: RABEPRAZOLE SODIUM 20 MG DR TAB] 180 tablet 1  ?  Sig: TAKE 2 TABLETS BY MOUTH ONCE DAILY  ?  ? Gastroenterology: Proton Pump Inhibitors Passed - 09/05/2021  3:40 PM  ?  ?  Passed - Valid encounter within last 12 months  ?  Recent Outpatient Visits   ?      ? 4 months ago Word finding difficulty  ? [place] P, DO  ? 7 months ago Mixed hyperlipidemia  ? [place], [place] P, DO  ? 1 year ago Acute recurrent frontal sinusitis  ? [place], Henrine Screws T, NP  ? 1 year ago Routine general medical examination at a health care facility  ? [place], Megan P, DO  ? 1 year ago Tremor  ? [place], [place] P, DO  ?  ?  ?Future Appointments   ?        ? Tomorrow Wynetta Emery, Barb Merino, DO [facility], [facility]  ? In 5 days [place], Herbert Seta, MD [place]  ?  ? ?  ?  ?  ?? simvastatin (ZOCOR) 40 MG tablet [Pharmacy Med Name: SIMVASTATIN 40 MG TAB] 90 tablet 1  ?  Sig: TAKE 1 TABLET BY MOUTH AT BEDTIME  ?  ? Cardiovascular:  Antilipid - Statins Failed - 09/05/2021  3:40 PM  ?  ?  Failed - Lipid Panel in normal range within the last 12 months  ?  Cholesterol, Total  ?Date Value Ref Range Status  ?01/25/2021 139 100 - 199 mg/dL Final  ? ?Cholesterol Piccolo, [place]  ?Date Value Ref Range Status  ?09/13/2015 CANCELED    ?  Comment:  ?  Test not performed ? ?Result canceled by the ancillary ?  ? ?LDL Chol Calc (NIH)  ?Date Value Ref Range Status  ?01/25/2021 66 0 - 99 mg/dL Final  ? ?HDL  ?Date Value Ref Range Status  ?01/25/2021 33 (L) >39 mg/dL Final  ? ?Triglycerides  ?Date Value Ref Range Status  ?01/25/2021 247 (H) 0 - 149 mg/dL Final  ? ?Triglycerides Piccolo,Waived  ?Date Value Ref Range Status  ?09/13/2015 CANCELED    ?  Comment:  ?  Test not performed ? ?Result canceled  by the ancillary ?  ? ?  ?  ?  Passed - Patient is not pregnant  ?  ?  Passed - Valid encounter within last 12 months  ?  Recent Outpatient Visits   ?      ? 4 months ago Word finding difficulty  ? [place] P, DO  ? 7 months ago Mixed hyperlipidemia  ? [place], [place] P, DO  ? 1 year ago Acute recurrent frontal sinusitis  ? [place], Henrine Screws T, NP  ? 1 year ago Routine general medical examination at a health care facility  ? [place], Megan P, DO  ? 1 year ago Tremor  ? [place], [place] P, DO  ?  ?  ?Future Appointments   ?        ? Tomorrow Wynetta Emery, Barb Merino, DO [facility], [facility]  ? In 5 days Diamantina [place], Herbert Seta, MD [place]  ?  ? ?  ?  ?  ? ?

## 2021-09-05 NOTE — Telephone Encounter (Signed)
Patient made aware that his lab orders were placed and come in this afternoon for a lab visit. Patient verbalized understanding ?

## 2021-09-05 NOTE — Telephone Encounter (Signed)
Orders in 

## 2021-09-06 ENCOUNTER — Ambulatory Visit (INDEPENDENT_AMBULATORY_CARE_PROVIDER_SITE_OTHER): Payer: BC Managed Care – PPO | Admitting: Family Medicine

## 2021-09-06 ENCOUNTER — Encounter: Payer: Self-pay | Admitting: Family Medicine

## 2021-09-06 VITALS — BP 127/75 | HR 71 | Temp 98.5°F | Ht 70.0 in | Wt 282.0 lb

## 2021-09-06 DIAGNOSIS — C61 Malignant neoplasm of prostate: Secondary | ICD-10-CM

## 2021-09-06 DIAGNOSIS — Z23 Encounter for immunization: Secondary | ICD-10-CM

## 2021-09-06 DIAGNOSIS — H9192 Unspecified hearing loss, left ear: Secondary | ICD-10-CM

## 2021-09-06 DIAGNOSIS — M4802 Spinal stenosis, cervical region: Secondary | ICD-10-CM

## 2021-09-06 DIAGNOSIS — K219 Gastro-esophageal reflux disease without esophagitis: Secondary | ICD-10-CM

## 2021-09-06 DIAGNOSIS — F321 Major depressive disorder, single episode, moderate: Secondary | ICD-10-CM | POA: Diagnosis not present

## 2021-09-06 DIAGNOSIS — E782 Mixed hyperlipidemia: Secondary | ICD-10-CM | POA: Diagnosis not present

## 2021-09-06 DIAGNOSIS — Z0000 Encounter for general adult medical examination without abnormal findings: Secondary | ICD-10-CM

## 2021-09-06 LAB — COMPREHENSIVE METABOLIC PANEL
ALT: 25 IU/L (ref 0–44)
AST: 17 IU/L (ref 0–40)
Albumin/Globulin Ratio: 2.4 — ABNORMAL HIGH (ref 1.2–2.2)
Albumin: 4.6 g/dL (ref 3.8–4.9)
Alkaline Phosphatase: 77 IU/L (ref 44–121)
BUN/Creatinine Ratio: 12 (ref 10–24)
BUN: 14 mg/dL (ref 8–27)
Bilirubin Total: 0.5 mg/dL (ref 0.0–1.2)
CO2: 19 mmol/L — ABNORMAL LOW (ref 20–29)
Calcium: 9.6 mg/dL (ref 8.6–10.2)
Chloride: 104 mmol/L (ref 96–106)
Creatinine, Ser: 1.15 mg/dL (ref 0.76–1.27)
Globulin, Total: 1.9 g/dL (ref 1.5–4.5)
Glucose: 85 mg/dL (ref 70–99)
Potassium: 4.1 mmol/L (ref 3.5–5.2)
Sodium: 140 mmol/L (ref 134–144)
Total Protein: 6.5 g/dL (ref 6.0–8.5)
eGFR: 73 mL/min/{1.73_m2} (ref >59)

## 2021-09-06 LAB — CBC WITH DIFFERENTIAL/PLATELET
Basophils Absolute: 0 10*3/uL (ref 0.0–0.2)
Basos: 1 %
EOS (ABSOLUTE): 0.2 10*3/uL (ref 0.0–0.4)
Eos: 2 %
Hematocrit: 48.6 % (ref 37.5–51.0)
Hemoglobin: 16.6 g/dL (ref 13.0–17.7)
Immature Grans (Abs): 0 10*3/uL (ref 0.0–0.1)
Immature Granulocytes: 0 %
Lymphocytes Absolute: 2.8 10*3/uL (ref 0.7–3.1)
Lymphs: 40 %
MCH: 29.5 pg (ref 26.6–33.0)
MCHC: 34.2 g/dL (ref 31.5–35.7)
MCV: 87 fL (ref 79–97)
Monocytes Absolute: 0.5 10*3/uL (ref 0.1–0.9)
Monocytes: 8 %
Neutrophils Absolute: 3.4 10*3/uL (ref 1.4–7.0)
Neutrophils: 49 %
Platelets: 177 10*3/uL (ref 150–450)
RBC: 5.62 x10E6/uL (ref 4.14–5.80)
RDW: 13 % (ref 11.6–15.4)
WBC: 6.9 10*3/uL (ref 3.4–10.8)

## 2021-09-06 LAB — LIPID PANEL
Chol/HDL Ratio: 3.5 ratio (ref 0.0–5.0)
Cholesterol, Total: 153 mg/dL (ref 100–199)
HDL: 44 mg/dL (ref >39)
LDL Chol Calc (NIH): 81 mg/dL (ref 0–99)
Triglycerides: 166 mg/dL — ABNORMAL HIGH (ref 0–149)
VLDL Cholesterol Cal: 28 mg/dL (ref 5–40)

## 2021-09-06 LAB — TSH: TSH: 2.79 u[IU]/mL (ref 0.450–4.500)

## 2021-09-06 LAB — PSA: Prostate Specific Ag, Serum: 0.6 ng/mL (ref 0.0–4.0)

## 2021-09-06 MED ORDER — SIMVASTATIN 40 MG PO TABS: 40.0000 mg | ORAL_TABLET | Freq: Every day | ORAL | 1 refills | Status: DC

## 2021-09-06 MED ORDER — DULOXETINE HCL 20 MG PO CPEP: 20.0000 mg | ORAL_CAPSULE | Freq: Every day | ORAL | 3 refills | Status: DC

## 2021-09-06 MED ORDER — GABAPENTIN 300 MG PO CAPS: ORAL_CAPSULE | ORAL | 4 refills | Status: DC

## 2021-09-06 MED ORDER — MELOXICAM 15 MG PO TABS
15.0000 mg | ORAL_TABLET | Freq: Every day | ORAL | 1 refills | Status: DC
Start: 2021-09-06 — End: 2022-03-10

## 2021-09-06 MED ORDER — RABEPRAZOLE SODIUM 20 MG PO TBEC: 40.0000 mg | DELAYED_RELEASE_TABLET | Freq: Every day | ORAL | 1 refills | Status: DC

## 2021-09-06 MED ORDER — BUPROPION HCL ER (XL) 300 MG PO TB24: 300.0000 mg | ORAL_TABLET | Freq: Every day | ORAL | 1 refills | Status: DC

## 2021-09-06 MED ORDER — TADALAFIL 10 MG PO TABS: 10.0000 mg | ORAL_TABLET | Freq: Every day | ORAL | 3 refills | Status: DC | PRN

## 2021-09-06 MED ORDER — TRAZODONE HCL 50 MG PO TABS
100.0000 mg | ORAL_TABLET | Freq: Every day | ORAL | 3 refills | Status: DC
Start: 2021-09-06 — End: 2022-03-10

## 2021-09-06 MED ORDER — TAMSULOSIN HCL 0.4 MG PO CAPS: 0.8000 mg | ORAL_CAPSULE | Freq: Every day | ORAL | 3 refills | Status: DC

## 2021-09-06 NOTE — Assessment & Plan Note (Signed)
Will add cymbalta to help with mood and pain. Continue gabapentin. Call with any concerns.  ?

## 2021-09-06 NOTE — Assessment & Plan Note (Signed)
Under good control on current regimen. Continue current regimen. Continue to monitor. Call with any concerns. Refills given. Labs drawn today.   

## 2021-09-06 NOTE — Assessment & Plan Note (Signed)
Will add cymbalta to help with mood and pain. Continue wellbutrin. Call with any concerns.  ?

## 2021-09-06 NOTE — Assessment & Plan Note (Signed)
Continue to follow with urology. PSA checked yesterday. Continue to monitor.  ?

## 2021-09-06 NOTE — Progress Notes (Signed)
? ?BP 127/75   Pulse 71   Temp 98.5 ?F (36.9 ?C)   Ht 5' 10"  (1.778 m)   Wt 282 lb (127.9 kg)   SpO2 96%   BMI 40.46 kg/m?   ? ?Subjective:  ? ? Patient ID: Curtis Ran., male    DOB: 04/07/1961, 61 y.o.   MRN: [id number] ? ?HPI: ?Curtis Mcfarlane. is a 61 y.o. male presenting on 09/06/2021 for comprehensive medical examination. Current medical complaints include: ? ?HYPERLIPIDEMIA ?Hyperlipidemia status: excellent compliance ?Satisfied with current treatment?  yes ?Side effects:  no ?Medication compliance: excellent compliance ?Past cholesterol meds: simvastatin ?Supplements: none ?Aspirin:  yes ?The 10-year ASCVD risk score (Arnett DK, et al., 2019) is: 7.3% ?  Values used to calculate the score: ?    Age: 13 years ?    Sex: Male ?    Is Non-Hispanic African American: No ?    Diabetic: No ?    Tobacco smoker: No ?    Systolic Blood Pressure: 235 mmHg ?    Is BP treated: No ?    HDL Cholesterol: 44 mg/dL ?    Total Cholesterol: 153 mg/dL ?Chest pain:  no ?Coronary artery disease:  no ? ?GERD ?GERD control status: controlled ?Satisfied with current treatment? yes ?Medication side effects: no  ?Medication compliance: excellent ?Dysphagia: no ?Odynophagia:  no ?Hematemesis: no ?Blood in stool: no ?EGD: no ? ?DEPRESSION ?Mood status: stable ?Satisfied with current treatment?: yes ?Symptom severity: mild  ?Duration of current treatment : chronic ?Side effects: no ?Medication compliance: excellent compliance ?Psychotherapy/counseling: no  ?Previous psychiatric medications: wellbutrin ?Depressed mood: yes ?Anxious mood: no ?Anhedonia: no ?Significant weight loss or gain: no ?Insomnia: no  ?Fatigue: yes ?Feelings of worthlessness or guilt: no ?Impaired concentration/indecisiveness: no ?Suicidal ideations: no ?Hopelessness: no ?Crying spells: no ? ?  09/06/2021  ?  2:43 PM 05/02/2021  ?  4:14 PM 01/25/2021  ?  8:24 AM 07/27/2020  ?  9:07 AM 03/29/2020  ?  2:55 PM  ?Depression screen PHQ 2/9  ?Decreased Interest 0 1 1 0  0  ?Down, Depressed, Hopeless 0 0 0 0 0  ?PHQ - 2 Score 0 1 1 0 0  ?Altered sleeping 0 0 0 0 0  ?Tired, decreased energy 2 3 1 1  0  ?Change in appetite 2 2 0 0 0  ?Feeling bad or failure about yourself  0 0 0 0 0  ?Trouble concentrating 3 2 3 1  0  ?Moving slowly or fidgety/restless 0 3 3 1  0  ?Suicidal thoughts 0 0 0 0 0  ?PHQ-9 Score 7 11 8 3  0  ?Difficult doing work/chores   Somewhat difficult Not difficult at all   ? ?Interim Problems from his last visit: no ? ?Depression Screen done today and results listed below:  ? ?  09/06/2021  ?  2:43 PM 05/02/2021  ?  4:14 PM 01/25/2021  ?  8:24 AM 07/27/2020  ?  9:07 AM 03/29/2020  ?  2:55 PM  ?Depression screen PHQ 2/9  ?Decreased Interest 0 1 1 0 0  ?Down, Depressed, Hopeless 0 0 0 0 0  ?PHQ - 2 Score 0 1 1 0 0  ?Altered sleeping 0 0 0 0 0  ?Tired, decreased energy 2 3 1 1  0  ?Change in appetite 2 2 0 0 0  ?Feeling bad or failure about yourself  0 0 0 0 0  ?Trouble concentrating 3 2 3 1  0  ?Moving slowly or fidgety/restless  0 3 3 1  0  ?Suicidal thoughts 0 0 0 0 0  ?PHQ-9 Score 7 11 8 3  0  ?Difficult doing work/chores   Somewhat difficult Not difficult at all   ? ?Past Medical History:  ?Past Medical History:  ?Diagnosis Date  ? Allergy   ? Cancer [facility])   ? Prostate  ? Carpal tunnel syndrome   ? DDD (degenerative disc disease), cervical   ? DDD (degenerative disc disease), cervical   ? Deafness in left ear   ? Dyspnea   ? with exertion  ? Edema   ? GERD (gastroesophageal reflux disease)   ? History of kidney stones   ? passed  ? Hyperlipidemia   ? Personal history of colonic polyps   ? Pinched nerve   ? upper and lower back  ? Prostate cancer ([place])   ? Sleep apnea   ? Bi-pap  ? Sleep apnea   ? Stroke [facility])   ? 2013, Blind eye  ? TIA (transient ischemic attack) 07/2011  ? Umbilical hernia   ? Vision impairment   ? Blindness Right Eye  ? ? ?Surgical History:  ?Past Surgical History:  ?Procedure Laterality Date  ? ANTERIOR CERVICAL DECOMP/DISCECTOMY FUSION N/A 03/30/2020  ?  Procedure: Cervical Four-Five/Cervical Five-Six / Cervical Six-Seven  Anterior cervical decompression/discectomy/fusion;  Surgeon: Erline Levine, MD;  Location: [place];  Service: Neurosurgery;  Laterality: N/A;  3C/RM 21  ? APPENDECTOMY    ? carpel tunnel Bilateral   ? COLONOSCOPY  2015  ? Found 2 beign polyps, repeat 10 years  ? COLONOSCOPY WITH PROPOFOL N/A 03/30/2019  ? Procedure: COLONOSCOPY WITH PROPOFOL;  Surgeon: [name], MD;  Location[facility] END[facility]COPY;  Service: Gastroenterology;  Laterality: N/A;  ? ESOPHAGOGASTRODUODENOSCOPY (EGD) WITH PROPOFOL N/A 03/30/2019  ? Procedure: ESOPHAGOGASTRODUODENOSCOPY (EGD) WITH PROPOFOL;  Surgeon: [name], MD;  Location[facility] END[facility]COPY;  Service: Gastroenterology;  Laterality: N/A;  ? POSTERIOR CERVICAL LAMINECTOMY WITH MET- RX Right 05/11/2020  ? Procedure: RIGHT CERVICAL FOUR-FIVE, CERVICAL FIVE-SIX FORAMINOTOMY WITH METRX;  Surgeon: Erline Levine, MD;  Location: [place];  Service: Neurosurgery;  Laterality: Right;  3C/RM 21  ? RADIOACTIVE SEED IMPLANT N/A 12/13/2018  ? Procedure: RADIOACTIVE SEED IMPLANT/BRACHYTHERAPY IMPLANT;  Surgeon: Billey Co, MD;  Location: [facility] ORS;  Service: Urology;  Laterality: N/A;  ? TONSILLECTOMY    ? ? ?Medications:  ?Current Outpatient Medications on File Prior to Visit  ?Medication Sig  ? ASPIRIN LOW DOSE 81 MG EC tablet TAKE 1 TABLET BY MOUTH ONCE DAILY  ? Coenzyme Q10 (COQ10) 200 MG CAPS Take 200 mg by mouth at bedtime.  ? Cyanocobalamin (VITAMIN B-12) 5000 MCG TBDP Take 5,000 mcg by mouth at bedtime.  ? OVER THE COUNTER MEDICATION Take by mouth daily. [place] vitamins  ? ?No current facility-administered medications on file prior to visit.  ? ? ?Allergies:  ?No Known Allergies ? ?Social History:  ?Social History  ? ?Socioeconomic History  ? Marital status: Married  ?  Spouse name: Not on file  ? Number of children: Not on file  ? Years of education: Not on file  ? Highest education level: Not on file  ?Occupational History  ? Not  on file  ?Tobacco Use  ? Smoking status: Former  ?  Years: 18.00  ?  Types: Cigarettes  ?  Quit date: 05/27/1995  ?  Years since quitting: 26.2  ? Smokeless tobacco: Never  ?Vaping Use  ? Vaping Use: Never used  ?Substance and Sexual Activity  ?  Alcohol use: Yes  ?  Alcohol/week: 2.0 standard drinks  ?  Types: 2 Glasses of wine per week  ? Drug use: No  ? Sexual activity: Yes  ?Other Topics Concern  ? Not on file  ?Social History Narrative  ? Not on file  ? ?Social Determinants of Health  ? ?Financial Resource Strain: Not on file  ?Food Insecurity: Not on file  ?Transportation Needs: Not on file  ?Physical Activity: Not on file  ?Stress: Not on file  ?Social Connections: Not on file  ?Intimate Partner Violence: Not on file  ? ?Social History  ? ?Tobacco Use  ?Smoking Status Former  ? Years: 18.00  ? Types: Cigarettes  ? Quit date: 05/27/1995  ? Years since quitting: 26.2  ?Smokeless Tobacco Never  ? ?Social History  ? ?Substance and Sexual Activity  ?Alcohol Use Yes  ? Alcohol/week: 2.0 standard drinks  ? Types: 2 Glasses of wine per week  ? ? ?Family History:  ?Family History  ?Problem Relation Age of Onset  ? Hearing loss Father   ? Hypertension Father   ? Pneumonia Father   ? Asthma Son   ? Cancer Maternal Grandmother   ?     Colon  ? ? ?Past medical history, surgical history, medications, allergies, family history and social history reviewed with patient today and changes made to appropriate areas of the chart.  ? ?Review of Systems  ?Constitutional: Negative.   ?HENT:  Positive for hearing loss. Negative for congestion, ear discharge, ear pain, nosebleeds, sinus pain, sore throat and tinnitus.   ?Eyes: Negative.   ?Respiratory:  Positive for shortness of breath. Negative for cough, hemoptysis, sputum production, wheezing and stridor.   ?Cardiovascular: Negative.   ?Gastrointestinal: Negative.  Negative for abdominal pain, blood in stool, constipation, diarrhea, heartburn, melena, nausea and vomiting.   ?Genitourinary: Negative.   ?Musculoskeletal:  Positive for myalgias. Negative for back pain, falls, joint pain and neck pain.  ?Skin: Negative.   ?Neurological:  Positive for tingling. Negative for dizziness, tremors, sensory

## 2021-09-07 LAB — URINALYSIS, ROUTINE W REFLEX MICROSCOPIC
Bilirubin, UA: NEGATIVE
Glucose, UA: NEGATIVE
Ketones, UA: NEGATIVE
Leukocytes,UA: NEGATIVE
Nitrite, UA: NEGATIVE
Protein,UA: NEGATIVE
RBC, UA: NEGATIVE
Specific Gravity, UA: 1.02 (ref 1.005–1.030)
Urobilinogen, Ur: 0.2 mg/dL (ref 0.2–1.0)
pH, UA: 7.5 (ref 5.0–7.5)

## 2021-09-07 LAB — MICROALBUMIN, URINE WAIVED
Creatinine, Urine Waived: 200 mg/dL (ref 10–300)
Microalb, Ur Waived: 10 mg/L (ref 0–19)
Microalb/Creat Ratio: <30 mg/g (ref <30)

## 2021-09-10 ENCOUNTER — Ambulatory Visit: Payer: BC Managed Care – PPO | Admitting: Urology

## 2021-09-10 ENCOUNTER — Encounter: Payer: Self-pay | Admitting: Urology

## 2021-09-10 VITALS — BP 129/77 | HR 62 | Ht 70.5 in | Wt 280.0 lb

## 2021-09-10 DIAGNOSIS — N529 Male erectile dysfunction, unspecified: Secondary | ICD-10-CM

## 2021-09-10 DIAGNOSIS — R399 Unspecified symptoms and signs involving the genitourinary system: Secondary | ICD-10-CM | POA: Diagnosis not present

## 2021-09-10 DIAGNOSIS — C61 Malignant neoplasm of prostate: Secondary | ICD-10-CM | POA: Diagnosis not present

## 2021-09-10 NOTE — Progress Notes (Signed)
? ?  09/10/2021 ?12:58 PM  ? ?[name] ?February 07, 1961 ?[id number] ? ?Reason for visit: Follow up prostate cancer, urinary symptoms, ED ? ?HPI: ?He is a 61 year old male who was found to have an elevated PSA of 6.7 in March 2020 and was found to have high volume high risk prostate cancer with no evidence of metastatic disease. He underwent his first Lupron injection in May 2020, and underwent brachytherapy on 12/13/2018.    He opted to discontinue ADT after 1 year secondary to severe side effects of worsening depression and apathy.  ? ?PSA was undetectable after treatment, but has had a very slow rise over the last year from 0.1 to 0.6 most recently on 09/05/2021.  We discussed the challenges in interpreting PSA values after brachytherapy and the concept of a PSA bounce.  Biochemical recurrence would be considered PSA greater than 2, as his PSA was 0 after treatment. ?  ?In terms of his urinary symptoms, he did not notice any major change in the urination after running out of Flomax a few weeks ago, and would like to stay off that medication.  He will occasionally have some urgency, but this is mild.  He drinks coffee and water during the day.  He also reports some intermittent mild discomfort after ejaculations of unclear etiology.  No gross hematuria.  He denies any problems with the strength of his urinary stream.  We discussed considering an OAB medication and he would like to hold off at this time, we reviewed behavioral strategies. ?  ?Cialis 10 mg every other day is working well for his ED.  We discussed that he could take this on an as-needed basis to decrease the amount of medications he takes on a daily basis.  Cialis was recently refilled by PCP. ? ?6 months lab visit PSA ?  ? ?Billey Co, MD ? ?[facility] ?[address] ?[place], [place] [address] ?[phone] ? ?

## 2021-10-05 ENCOUNTER — Other Ambulatory Visit: Payer: Self-pay | Admitting: Urology

## 2021-10-10 ENCOUNTER — Encounter: Payer: Self-pay | Admitting: Family Medicine

## 2021-10-10 ENCOUNTER — Telehealth (INDEPENDENT_AMBULATORY_CARE_PROVIDER_SITE_OTHER): Payer: BC Managed Care – PPO | Admitting: Family Medicine

## 2021-10-10 DIAGNOSIS — F321 Major depressive disorder, single episode, moderate: Secondary | ICD-10-CM

## 2021-10-10 DIAGNOSIS — M3500 Sicca syndrome, unspecified: Secondary | ICD-10-CM | POA: Diagnosis not present

## 2021-10-10 MED ORDER — DULOXETINE HCL 20 MG PO CPEP: 20.0000 mg | ORAL_CAPSULE | Freq: Every day | ORAL | 1 refills | Status: DC

## 2021-10-10 NOTE — Progress Notes (Signed)
There were no vitals taken for this visit.   Subjective:    Patient ID: Curtis Bray., male    DOB: Mar 04, 1961, 61 y.o.   MRN: [id number]  HPI: Curtis Bray. is a 61 y.o. male  Chief Complaint  Patient presents with   Depression   DEPRESSION Mood status: stable Satisfied with current treatment?: yes Symptom severity: mild  Duration of current treatment :  1 month Side effects: no Medication compliance: good compliance Psychotherapy/counseling: no  Previous psychiatric medications: cymbalta and wellbutruin Depressed mood: yes Anxious mood: no Anhedonia: no Significant weight loss or gain: no Insomnia: no  Fatigue: yes Feelings of worthlessness or guilt: no Impaired concentration/indecisiveness: yes Suicidal ideations: no Hopelessness: no Crying spells: no    10/10/2021    4:18 PM 09/06/2021    2:43 PM 05/02/2021    4:14 PM 01/25/2021    8:24 AM 07/27/2020    9:07 AM  Depression screen PHQ 2/9  Decreased Interest 1 0 1 1 0  Down, Depressed, Hopeless 0 0 0 0 0  PHQ - 2 Score 1 0 1 1 0  Altered sleeping 0 0 0 0 0  Tired, decreased energy 3 2 3 1 1   Change in appetite 0 2 2 0 0  Feeling bad or failure about yourself  0 0 0 0 0  Trouble concentrating 1 3 2 3 1   Moving slowly or fidgety/restless 1 0 3 3 1   Suicidal thoughts 0 0 0 0 0  PHQ-9 Score 6 7 11 8 3   Difficult doing work/chores    Somewhat difficult Not difficult at all    Relevant past medical, surgical, family and social history reviewed and updated as indicated. Interim medical history since our last visit reviewed. Allergies and medications reviewed and updated.  Review of Systems  Constitutional: Negative.   Respiratory: Negative.    Cardiovascular: Negative.   Musculoskeletal: Negative.   Neurological: Negative.   Psychiatric/Behavioral: Negative.     Per HPI unless specifically indicated above     Objective:    There were no vitals taken for this visit.  Wt Readings from Last 3  Encounters:  09/10/21 280 lb (127 kg)  09/06/21 282 lb (127.9 kg)  05/02/21 275 lb 9.6 oz (125 kg)    Physical Exam Vitals and nursing note reviewed.  Constitutional:      General: He is not in acute distress.    Appearance: Normal appearance. He is obese. He is not ill-appearing, toxic-appearing or diaphoretic.  HENT:     Head: Normocephalic and atraumatic.     Right Ear: External ear normal.     Left Ear: External ear normal.     Nose: Nose normal.     Mouth/Throat:     Mouth: Mucous membranes are moist.     Pharynx: Oropharynx is clear.  Eyes:     General: No scleral icterus.       Right eye: No discharge.        Left eye: No discharge.     Conjunctiva/sclera: Conjunctivae normal.     Pupils: Pupils are equal, round, and reactive to light.  Pulmonary:     Effort: Pulmonary effort is normal. No respiratory distress.     Comments: Speaking in full sentences Musculoskeletal:        General: Normal range of motion.     Cervical back: Normal range of motion.  Skin:    Coloration: Skin is not jaundiced or pale.  Findings: No bruising, erythema, lesion or rash.  Neurological:     Mental Status: He is alert and oriented to person, place, and time. Mental status is at baseline.  Psychiatric:        Mood and Affect: Mood normal.        Behavior: Behavior normal.        Thought Content: Thought content normal.        Judgment: Judgment normal.    Results for orders placed or performed in visit on 09/05/21  TSH  Result Value Ref Range   TSH 2.790 0.450 - 4.500 uIU/mL  PSA  Result Value Ref Range   Prostate Specific Ag, Serum 0.6 0.0 - 4.0 ng/mL  Lipid panel  Result Value Ref Range   Cholesterol, Total 153 100 - 199 mg/dL   Triglycerides 166 (H) 0 - 149 mg/dL   HDL 44 >39 mg/dL   VLDL Cholesterol Cal 28 5 - 40 mg/dL   LDL Chol Calc (NIH) 81 0 - 99 mg/dL   Chol/HDL Ratio 3.5 0.0 - 5.0 ratio  CBC with Differential/Platelet  Result Value Ref Range   WBC 6.9 3.4 - 10.8  x10E3/uL   RBC 5.62 4.14 - 5.80 x10E6/uL   Hemoglobin 16.6 13.0 - 17.7 g/dL   Hematocrit 48.6 37.5 - 51.0 %   MCV 87 79 - 97 fL   MCH 29.5 26.6 - 33.0 pg   MCHC 34.2 31.5 - 35.7 g/dL   RDW 13.0 11.6 - 15.4 %   Platelets 177 150 - 450 x10E3/uL   Neutrophils 49 Not Estab. %   Lymphs 40 Not Estab. %   Monocytes 8 Not Estab. %   Eos 2 Not Estab. %   Basos 1 Not Estab. %   Neutrophils Absolute 3.4 1.4 - 7.0 x10E3/uL   Lymphocytes Absolute 2.8 0.7 - 3.1 x10E3/uL   Monocytes Absolute 0.5 0.1 - 0.9 x10E3/uL   EOS (ABSOLUTE) 0.2 0.0 - 0.4 x10E3/uL   Basophils Absolute 0.0 0.0 - 0.2 x10E3/uL   Immature Granulocytes 0 Not Estab. %   Immature Grans (Abs) 0.0 0.0 - 0.1 x10E3/uL  Comprehensive metabolic panel  Result Value Ref Range   Glucose 85 70 - 99 mg/dL   BUN 14 8 - 27 mg/dL   Creatinine, Ser 1.15 0.76 - 1.27 mg/dL   eGFR 73 >59 mL/min/1.73   BUN/Creatinine Ratio 12 10 - 24   Sodium 140 134 - 144 mmol/L   Potassium 4.1 3.5 - 5.2 mmol/L   Chloride 104 96 - 106 mmol/L   CO2 19 (L) 20 - 29 mmol/L   Calcium 9.6 8.6 - 10.2 mg/dL   Total Protein 6.5 6.0 - 8.5 g/dL   Albumin 4.6 3.8 - 4.9 g/dL   Globulin, Total 1.9 1.5 - 4.5 g/dL   Albumin/Globulin Ratio 2.4 (H) 1.2 - 2.2   Bilirubin Total 0.5 0.0 - 1.2 mg/dL   Alkaline Phosphatase 77 44 - 121 IU/L   AST 17 0 - 40 IU/L   ALT 25 0 - 44 IU/L      Assessment & Plan:   Problem List Items Addressed This Visit       Other   Depression, major, single episode, moderate (HCC)    Stable. Unsure if the cymbalta is helping much. Will give him a few more weeks on it. Will check in by mychart in 4-6 weeks and increase dose if needed.        Relevant Medications   DULoxetine (CYMBALTA) 20  MG capsule   Sjogren's syndrome ([place]) - Primary    Would like to get a 2nd opinion. ENT also recommended it. New referral to rheum placed today.       Relevant Orders   Ambulatory referral to Rheumatology     Follow up plan: Return in about 5  months (around 03/12/2022) for 6 month follow up.   This visit was completed via video visit through MyChart due to the restrictions of the COVID-19 pandemic. All issues as above were discussed and addressed. Physical exam was done as above through visual confirmation on video through MyChart. If it was felt that the patient should be evaluated in the office, they were directed there. The patient verbally consented to this visit. Location of the patient: home Location of the provider: work Those involved with this call:  Provider: Park Liter, DO CMA: Louanna Raw, [place] Desk/Registration: [employer]  Time spent on call:  15 minutes with patient face to face via video conference. More than 50% of this time was spent in counseling and coordination of care. 23 minutes total spent in review of patient's record and preparation of their chart.

## 2021-10-10 NOTE — Assessment & Plan Note (Signed)
Would like to get a 2nd opinion. ENT also recommended it. New referral to rheum placed today.

## 2021-10-10 NOTE — Assessment & Plan Note (Signed)
Stable. Unsure if the cymbalta is helping much. Will give him a few more weeks on it. Will check in by mychart in 4-6 weeks and increase dose if needed.

## 2021-10-11 NOTE — Progress Notes (Signed)
LVM asking patient to call back to schedule an appt 

## 2021-10-14 NOTE — Progress Notes (Signed)
Patient scheduled.

## 2021-10-31 ENCOUNTER — Ambulatory Visit
Admission: RE | Admit: 2021-10-31 | Discharge: 2021-10-31 | Disposition: A | Discharge status: Home / Self Care | Payer: BC Managed Care – PPO | Source: Ambulatory Visit | Attending: Radiation Oncology | Admitting: Radiation Oncology

## 2021-10-31 VITALS — BP 126/89 | HR 68 | Temp 98.2°F | Resp 18 | Ht 70.5 in | Wt 286.6 lb

## 2021-10-31 DIAGNOSIS — C61 Malignant neoplasm of prostate: Secondary | ICD-10-CM

## 2021-10-31 NOTE — Progress Notes (Signed)
Radiation Oncology Follow up Note  Name: Curtis Bray.   Date:   10/31/2021 MRN:  [id number] DOB: [birth year]    This 60 y.o. male presents to the clinic today for 3-year follow-up status post I-125 interstitial implant for Gleason 7 (3+4) adenocarcinoma the prostate presenting with a PSA of 6.7.  REFERRING PROVIDER: Valerie Roys, DO  HPI: Patient is a 61 year old male now out 3 years having completed I-125 interstitial implant for Gleason 7 adenocarcinoma the prostate he is doing well specifically denies any increased lower urinary tract symptoms diarrhea or fatigue.  His most recent PSA is 0.6 it was 0.5 back in December 2022.Marland Kitchen  COMPLICATIONS OF TREATMENT: none  FOLLOW UP COMPLIANCE: keeps appointments   PHYSICAL EXAM:  BP 126/89   Pulse 68   Temp 98.2 F (36.8 C)   Resp 18   Ht 5' 10.5" (1.791 m)   Wt 286 lb 9.6 oz (130 kg)   BMI 40.54 kg/m  Well-developed well-nourished patient in NAD. HEENT reveals PERLA, EOMI, discs not visualized.  Oral cavity is clear. No oral mucosal lesions are identified. Neck is clear without evidence of cervical or supraclavicular adenopathy. Lungs are clear to A&P. Cardiac examination is essentially unremarkable with regular rate and rhythm without murmur rub or thrill. Abdomen is benign with no organomegaly or masses noted. Motor sensory and DTR levels are equal and symmetric in the upper and lower extremities. Cranial nerves II through XII are grossly intact. Proprioception is intact. No peripheral adenopathy or edema is identified. No motor or sensory levels are noted. Crude visual fields are within normal range.  RADIOLOGY RESULTS: No current films for review  PLAN: Present time patient remains under excellent biochemical control of his prostate cancer.  I will see him back in 1 year for follow-up.  Patient is to call with any concerns.  Have a PSA prior to his next visit.  I would like to take this opportunity to thank you for allowing me to  participate in the care of your patient.Noreene Filbert, MD

## 2021-11-13 ENCOUNTER — Other Ambulatory Visit: Payer: Self-pay | Admitting: *Deleted

## 2021-11-13 DIAGNOSIS — C61 Malignant neoplasm of prostate: Secondary | ICD-10-CM

## 2021-11-21 ENCOUNTER — Encounter: Payer: Self-pay | Admitting: Family Medicine

## 2021-12-06 ENCOUNTER — Other Ambulatory Visit: Payer: Self-pay | Admitting: Family Medicine

## 2021-12-06 ENCOUNTER — Other Ambulatory Visit: Payer: Self-pay | Admitting: Neurosurgery

## 2021-12-06 DIAGNOSIS — M47816 Spondylosis without myelopathy or radiculopathy, lumbar region: Secondary | ICD-10-CM

## 2021-12-06 NOTE — Telephone Encounter (Signed)
Requested Prescriptions  Pending Prescriptions Disp Refills  . ASPIRIN LOW DOSE 81 MG tablet [Pharmacy Med Name: ASPIRIN LOW DOSE 81 MG DR TAB] 90 tablet 2    Sig: TAKE 1 TABLET BY MOUTH ONCE DAILY     Analgesics:  NSAIDS - aspirin Passed - 12/06/2021  4:05 PM      Passed - Cr in normal range and within 360 days    Creatinine, Ser  Date Value Ref Range Status  09/05/2021 1.15 0.76 - 1.27 mg/dL Final         Passed - eGFR is 10 or above and within 360 days    GFR calc Af Amer  Date Value Ref Range Status  06/29/2020 84 >59 mL/min/1.73 Final    Comment:    **In accordance with recommendations from the NKF-ASN Task force,**   [facility] is in the process of updating its eGFR calculation to the   2021 CKD-EPI creatinine equation that estimates kidney function   without a race variable.    GFR, Estimated  Date Value Ref Range Status  05/11/2020 >60 >60 mL/min Final    Comment:    (NOTE) Calculated using the CKD-EPI Creatinine Equation (2021)    GFR calc non Af Amer  Date Value Ref Range Status  06/29/2020 72 >59 mL/min/1.73 Final   eGFR  Date Value Ref Range Status  09/05/2021 73 >59 mL/min/1.73 Final         Passed - Patient is not pregnant      Passed - Valid encounter within last 12 months    Recent Outpatient Visits          1 month ago Sjogren's syndrome, with unspecified organ involvement ([place])   [place], Megan P, DO   3 months ago Routine general medical examination at a health care facility   [facility], [place], DO   7 months ago Word finding difficulty   [place], Megan P, DO   10 months ago Mixed hyperlipidemia   [facility], [place], DO   1 year ago Acute recurrent frontal sinusitis   [place], Barbaraann Faster, NP      Future Appointments            In 3 months Johnson, Barb Merino, DO [facility]   In 9 months Wynetta Emery, Barb Merino, DO  [facility]

## 2021-12-14 ENCOUNTER — Ambulatory Visit
Admission: RE | Admit: 2021-12-14 | Discharge: 2021-12-14 | Disposition: A | Discharge status: Home / Self Care | Payer: BC Managed Care – PPO | Source: Ambulatory Visit | Attending: Neurosurgery | Admitting: Neurosurgery

## 2021-12-14 DIAGNOSIS — M47816 Spondylosis without myelopathy or radiculopathy, lumbar region: Secondary | ICD-10-CM

## 2022-01-14 ENCOUNTER — Encounter: Payer: Self-pay | Admitting: Family Medicine

## 2022-01-20 ENCOUNTER — Other Ambulatory Visit: Payer: Self-pay | Admitting: Family Medicine

## 2022-01-21 NOTE — Telephone Encounter (Signed)
Requested medication (s) are due for refill today - no  Requested medication (s) are on the active medication list -no  Future visit scheduled -yes  Last refill: 11/25/19  Notes to clinic: medication not listed on current medication list  Requested Prescriptions  Pending Prescriptions Disp Refills   fluticasone (FLONASE) 50 MCG/ACT nasal spray [Pharmacy Med Name: FLUTICASONE PROPIONATE 50 MCG/ACT N] 48 g 3    Sig: PLACE 2 SPRAYS INTO BOTH NOSTRILS ONCE DAILY     Ear, Nose, and Throat: Nasal Preparations - Corticosteroids Passed - 01/20/2022  4:48 PM      Passed - Valid encounter within last 12 months    Recent Outpatient Visits           3 months ago Sjogren's syndrome, with unspecified organ involvement ([place])   [place], Megan P, DO   4 months ago Routine general medical examination at a health care facility   [facility], [place] P, DO   8 months ago Word finding difficulty   [place], [place], DO   12 months ago Mixed hyperlipidemia   [employer], Megan P, DO   1 year ago Acute recurrent frontal sinusitis   [place], Barbaraann Faster, NP       Future Appointments             In 1 month Johnson, Megan P, DO [facility]   In 7 months Johnson, [place], DO [facility]               Requested Prescriptions  Pending Prescriptions Disp Refills   fluticasone (FLONASE) 50 MCG/ACT nasal spray [Pharmacy Med Name: FLUTICASONE PROPIONATE 50 MCG/ACT N] 48 g 3    Sig: PLACE 2 SPRAYS INTO BOTH NOSTRILS ONCE DAILY     Ear, Nose, and Throat: Nasal Preparations - Corticosteroids Passed - 01/20/2022  4:48 PM      Passed - Valid encounter within last 12 months    Recent Outpatient Visits           3 months ago Sjogren's syndrome, with unspecified organ involvement ([place])   [place], Megan P, DO   4 months ago Routine  general medical examination at a health care facility   [facility], [place], DO   8 months ago Word finding difficulty   [employer], [place], DO   12 months ago Mixed hyperlipidemia   [employer], Megan P, DO   1 year ago Acute recurrent frontal sinusitis   [place], Barbaraann Faster, NP       Future Appointments             In 1 month Johnson, Barb Merino, DO [facility] [place]   In 7 months Johnson, Barb Merino, DO [facility]

## 2022-01-24 ENCOUNTER — Ambulatory Visit: Payer: BC Managed Care – PPO | Attending: Internal Medicine | Admitting: Internal Medicine

## 2022-01-24 ENCOUNTER — Ambulatory Visit (INDEPENDENT_AMBULATORY_CARE_PROVIDER_SITE_OTHER): Payer: BC Managed Care – PPO

## 2022-01-24 ENCOUNTER — Encounter: Payer: Self-pay | Admitting: Internal Medicine

## 2022-01-24 VITALS — BP 120/67 | HR 75 | Resp 18 | Ht 70.5 in | Wt 282.0 lb

## 2022-01-24 DIAGNOSIS — G8929 Other chronic pain: Secondary | ICD-10-CM | POA: Diagnosis not present

## 2022-01-24 DIAGNOSIS — M3500 Sicca syndrome, unspecified: Secondary | ICD-10-CM

## 2022-01-24 DIAGNOSIS — M25551 Pain in right hip: Secondary | ICD-10-CM

## 2022-01-24 DIAGNOSIS — M25511 Pain in right shoulder: Secondary | ICD-10-CM | POA: Diagnosis not present

## 2022-01-24 DIAGNOSIS — M5030 Other cervical disc degeneration, unspecified cervical region: Secondary | ICD-10-CM

## 2022-01-24 DIAGNOSIS — M4726 Other spondylosis with radiculopathy, lumbar region: Secondary | ICD-10-CM

## 2022-01-24 NOTE — Progress Notes (Signed)
Office Visit Note  Patient: Curtis Bray.             Date of Birth: 06-Feb-1961           MRN: [id number]             PCP: Valerie Roys, DO Referring: Valerie Roys, DO Visit Date: 01/24/2022 Occupation: Engineering profressor  Subjective:   History of Present Illness: Rodrigues Urbanek. is a 61 y.o. male here for sjogren's syndrome and osteoarthritis. He previously saw [facility] rheumatology most recently Dr. Posey Pronto in 2022. Lab findings with positive ANA and SSB Abs. He also has chronic back and neck pain with multilevel degenerative changes on MRI. Treatment with NSAIDs and gabapentin for joint pains. He was prescribed pilocarpine for dry eyes and mouth symptoms apparently with limited improvement. He has longstanding history of joint pains he recalls developing right shoulder pain issues decades ago when playing baseball he plays multiple positions including pitching.  He also experienced 1 episode of acute low back pain with inability to tolerate running this was treated with low back steroid injection that was excruciatingly painful but symptoms resolved and stayed better for many years.  His more significant recent problems are with worsening lower and upper back pains in the past 5 to 6 years.  These have somewhat progressively worsened and also develops right leg numbness provoked with prolonged standing or walking and alleviated by sitting and forward bending.  He has had multiple series of steroid injections that are only slightly helpful.  Evaluation of his cervical spine showed pretty advanced disease with some instability and had to fixation with good improvement.  However he has a persistent area of numbness across the top of the right shoulder ever since and has decreased lateral rotation range of motion.  He does get right shoulder pain particularly with certain positions reaching overhead and behind his back also if he rolls onto his right side while sleeping the  pain will wake him within about 30 minutes and unable to move his arm.  Besides the multiple joint problems he was previously diagnosed with Sjogren's syndrome based on severe dry eyes and mouth.  This presented with accelerated dental decay that is attributed to dryness and nighttime acid reflux.  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  His left eye previously developed vision changes from severe dryness do not treat this with using refresh drops every day as often as he remembers to.  He does not have discrete tear gland or facial saliva gland swelling that he can recall.  He denies any history of Raynaud's symptoms photosensitive skin rashes and does not experience any visible peripheral joint swelling.   MRI C Spine: 1. Interval ACDF at C4-C7 with improved spinal stenosis at C5-6 and C6-7. Residual central to right foraminal endplate osseous spurring and/or disc osteophyte at C4-5 with persistent moderate canal and severe right C5 foraminal stenosis.  2. Persistent uncovertebral spurring at C5-6 and C6-7 with residual severe right C6 and left C7 foraminal stenosis.  3. Disc bulge with uncovertebral hypertrophy at C3-4 with mild canal and moderate bilateral C4 foraminal stenosis, stable.   MRI L spine (11/2020): 1. Mild spinal canal stenosis at L4-L5 due to combination of disc bulge and severe facet arthrosis.  2. Mild right L5-S1 neural foraminal stenosis. 3. Moderate facet hypertrophy at L5-S1, which may serve as a source of local low back pain.    Activities of  Daily Living:  Patient reports morning stiffness for 15 minutes.   Patient Reports nocturnal pain.  Difficulty dressing/grooming: Denies Difficulty climbing stairs: Reports Difficulty getting out of chair: Reports Difficulty using hands for taps, buttons, cutlery, and/or writing: Denies  Review of Systems  Constitutional:  Positive for fatigue.  HENT:  Positive for mouth dryness. Negative for mouth sores.    Eyes:  Positive for dryness.  Respiratory:  Positive for shortness of breath.   Cardiovascular:  Negative for chest pain and palpitations.  Gastrointestinal:  Positive for blood in stool. Negative for constipation and diarrhea.  Endocrine: Negative for increased urination.  Genitourinary:  Negative for involuntary urination.  Musculoskeletal:  Positive for joint pain, joint pain, joint swelling and morning stiffness. Negative for gait problem, myalgias, muscle weakness, muscle tenderness and myalgias.  Skin:  Negative for color change, rash, hair loss and sensitivity to sunlight.  Allergic/Immunologic: Negative for susceptible to infections.  Neurological:  Positive for numbness. Negative for dizziness and headaches.  Hematological:  Negative for swollen glands.  Psychiatric/Behavioral:  Negative for depressed mood and sleep disturbance. The patient is nervous/anxious.     PMFS History:  Patient Active Problem List   Diagnosis Date Noted   Sjogren's syndrome ([place]) 10/10/2021   Cervical stenosis of spinal canal 03/30/2020   Depression, major, single episode, moderate ([place]) 11/25/2019   Osteoarthritis of spine with radiculopathy, lumbar region 11/21/2019   Malignant neoplasm of prostate ([place]) 03/21/2019   Lung nodule 03/20/2017   Morbid obesity with BMI of 40.0-44.9, adult ([place]) 10/14/2016   Chronic right shoulder pain 09/17/2016   DDD (degenerative disc disease), cervical 09/17/2016   Right carpal tunnel syndrome 09/12/2016   Insomnia 09/13/2015   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/44/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   Umbilical hernia without obstruction and without gangrene 02/13/2015   Chronic nasal congestion 02/13/2015   Sleep apnea    Hyperlipidemia    GERD (gastroesophageal reflux disease)     Past Medical History:  Diagnosis Date   Allergy    Cancer ([place])    Prostate   Carpal tunnel syndrome    DDD (degenerative disc disease), cervical    DDD (degenerative disc disease), cervical    Deafness in left ear     Dyspnea    with exertion   Edema    GERD (gastroesophageal reflux disease)    History of kidney stones    passed   Hyperlipidemia    Personal history of colonic polyps    Pinched nerve    upper and lower back   Prostate cancer ([place])    Sleep apnea    Bi-pap   Sleep apnea    Stroke ([place])    2013, Blind eye   TIA (transient ischemic attack) 00/8676   Umbilical hernia    Vision impairment    Blindness Right Eye    Family History  Problem Relation Age of Onset   Hearing loss Father    Hypertension Father    Pneumonia Father    Cancer Maternal Grandmother        Colon   Asthma Son    Past Surgical History:  Procedure Laterality Date   ANTERIOR CERVICAL DECOMP/DISCECTOMY FUSION N/A 03/30/2020   Procedure: Cervical Four-Five/Cervical Five-Six / Cervical Six-Seven  Anterior cervical decompression/discectomy/fusion;  Surgeon: Erline Levine, MD;  Location: [place];  Service: Neurosurgery;  Laterality: N/A;  3C/RM 21   APPENDECTOMY     carpel tunnel Bilateral    COLONOSCOPY  2015   Found 2 beign polyps, repeat 10 years  COLONOSCOPY WITH PROPOFOL N/A 03/30/2019   Procedure: COLONOSCOPY WITH PROPOFOL;  Surgeon: [name], MD;  Location: [facility] ENDOSCOPY;  Service: Gastroenterology;  Laterality: N/A;   ESOPHAGOGASTRODUODENOSCOPY (EGD) WITH PROPOFOL N/A 03/30/2019   Procedure: ESOPHAGOGASTRODUODENOSCOPY (EGD) WITH PROPOFOL;  Surgeon: [name], MD;  Location: [facility] ENDOSCOPY;  Service: Gastroenterology;  Laterality: N/A;   POSTERIOR CERVICAL LAMINECTOMY WITH MET- RX Right 05/11/2020   Procedure: RIGHT CERVICAL FOUR-FIVE, CERVICAL FIVE-SIX FORAMINOTOMY WITH METRX;  Surgeon: Erline Levine, MD;  Location: [place];  Service: Neurosurgery;  Laterality: Right;  3C/RM 21   RADIOACTIVE SEED IMPLANT N/A 12/13/2018   Procedure: RADIOACTIVE SEED IMPLANT/BRACHYTHERAPY IMPLANT;  Surgeon: Billey Co, MD;  Location: [facility] ORS;  Service: Urology;  Laterality: N/A;   TONSILLECTOMY     Social History    Social History Narrative   Not on file   Immunization History  Administered Date(s) Administered   Influenza,inj,Quad PF,6+ Mos 05/02/2021   Influenza-Unspecified 03/06/2015, 03/11/2016, 03/17/2017, 03/09/2018, 03/23/2019, 03/14/2020   PFIZER(Purple Top)SARS-COV-2 Vaccination 12/27/2019, 01/24/2020   Pneumococcal Polysaccharide-23 03/20/2016   Tdap 03/18/2016   Zoster Recombinat (Shingrix) 05/02/2021, 09/06/2021   Zoster, Live 03/20/2016     Objective: Vital Signs: BP 120/67 (BP Location: Left Wrist, Patient Position: Sitting, Cuff Size: Normal)   Pulse 75   Resp 18   Ht 5' 10.5" (1.791 m)   Wt 282 lb (127.9 kg)   BMI 39.89 kg/m    Physical Exam Constitutional:      Appearance: He is obese.  HENT:     Mouth/Throat:     Mouth: Mucous membranes are dry.     Pharynx: Oropharynx is clear.  Cardiovascular:     Rate and Rhythm: Normal rate and regular rhythm.  Pulmonary:     Effort: Pulmonary effort is normal.     Breath sounds: Normal breath sounds.  Musculoskeletal:     Right lower leg: No edema.     Left lower leg: No edema.  Lymphadenopathy:     Cervical: No cervical adenopathy.  Skin:    General: Skin is warm and dry.  Neurological:     Mental Status: He is alert.     Motor: No weakness.     Deep Tendon Reflexes: Reflexes normal.  Psychiatric:        Mood and Affect: Mood normal.      Musculoskeletal Exam:  Neck slightly decreased left lower lateral rotation Shoulders full range of motion is intact right shoulder pain provoked with overhead abduction and with far internal and external rotation while held in horizontal position Elbows full ROM no tenderness or swelling Wrists full ROM no tenderness or swelling Fingers full ROM no tenderness or swelling Some midline and paraspinal muscle tenderness to palpation mostly over the mid back area No hip lateral tenderness to pressure, internal rotation in the right hip provokes both lateral pain and anterior pain  going towards the groin no left hip pain with rotation Knees full ROM no tenderness or swelling Ankles full ROM no tenderness or swelling   Investigation: No additional findings.  Imaging: XR HIP UNILAT W OR W/O PELVIS 2-3 VIEWS RIGHT  Result Date: 01/24/2022 Xray pelvis and right hip SI joints with mild sclerosis no visible erosive changes. Moderate appearing right hip joint degenerative arthritis. Left hip with very mild change. Opacities consistent with previous prostate cancer treatment. No visible swelling erosions or abnormal calcifications seen. Impression Moderate appearing right hip osteoarthritis  XR Shoulder Right  Result Date: 01/24/2022 Xray right shoulder 4 views  Acromiohumeral distance appears normal. There is mild appearing degenerative change at glenohumeral joint surface. No visible swelling seen. No visible erosions or abnormal calcifications. Impression Mild glenohumeral joint osteoarthritis   Recent Labs: Lab Results  Component Value Date   WBC 6.9 09/05/2021   HGB 16.6 09/05/2021   PLT 177 09/05/2021   NA 140 09/05/2021   K 4.1 09/05/2021   CL 104 09/05/2021   CO2 19 (L) 09/05/2021   GLUCOSE 85 09/05/2021   BUN 14 09/05/2021   CREATININE 1.15 09/05/2021   BILITOT 0.5 09/05/2021   ALKPHOS 77 09/05/2021   AST 17 09/05/2021   ALT 25 09/05/2021   PROT 6.5 09/05/2021   ALBUMIN 4.6 09/05/2021   CALCIUM 9.6 09/05/2021   GFRAA 84 06/29/2020    Speciality Comments: No specialty comments available.  Procedures:  No procedures performed Allergies: Patient has no known allergies.   Assessment / Plan:     Visit Diagnoses: Sjogren's syndrome, with unspecified organ involvement ([place]) - Plan: Sjogrens syndrome-A extractable nuclear antibody, Sjogrens syndrome-B extractable nuclear antibody, C3 and C4, Rheumatoid factor, Sedimentation rate, C-reactive protein  Diagnosis remains somewhat equivocal with isolated La antibody positivity.  Most strongly associated  symptoms endorsed would be the dry eyes and mouth which are very longstanding.  I am not sure to what extent inflammatory disease is currently contributing to joint pain versus generalized osteoarthritis.  Checking SSA SSB antibodies as well as serum complements rheumatoid factor sedimentation rate and CRP for Sjogren's syndrome disease activity assessment.  Chronic right shoulder pain - Plan: XR Shoulder Right  Right shoulder pain with overhead positions to just do for shoulder impingement no significant loss of mobility.  X-ray does show some probable mild glenohumeral joint arthritis but I do not believe this accounts for symptoms.  Overall seems suspicious for shoulder bursitis or rotator cuff tendinosis.  Osteoarthritis of spine with radiculopathy, lumbar region - Plan: XR HIP UNILAT W OR W/O PELVIS 2-3 VIEWS RIGHT  Chronic low back pain with radiculopathy to the right leg.  He already has known facet arthropathy and seeing orthopedic surgery for this doing observation and repeat series of injections.  X-rays of hip and pelvis checked in clinic today do show a moderate amount of true hip joint osteoarthritis in the right side as well.  DDD (degenerative disc disease), cervical  Orders: Orders Placed This Encounter  Procedures   XR Shoulder Right   XR HIP UNILAT W OR W/O PELVIS 2-3 VIEWS RIGHT   Sjogrens syndrome-A extractable nuclear antibody   Sjogrens syndrome-B extractable nuclear antibody   C3 and C4   Rheumatoid factor   Sedimentation rate   C-reactive protein   No orders of the defined types were placed in this encounter.    Follow-Up Instructions: No follow-ups on file.   Collier Salina, MD  Note - This record has been created using [employer].  Chart creation errors have been sought, but may not always  have been located. Such creation errors do not reflect on  the standard of medical care.

## 2022-01-25 LAB — SJOGRENS SYNDROME-B EXTRACTABLE NUCLEAR ANTIBODY: SSB (La) (ENA) Antibody, IgG: 1.2 AI — AB

## 2022-01-25 LAB — C-REACTIVE PROTEIN: CRP: 1.1 mg/L (ref <8.0)

## 2022-01-25 LAB — C3 AND C4
C3 Complement: 148 mg/dL (ref 82–185)
C4 Complement: 23 mg/dL (ref 15–53)

## 2022-01-25 LAB — RHEUMATOID FACTOR: Rheumatoid fact SerPl-aCnc: <14 IU/mL (ref <14)

## 2022-01-25 LAB — SEDIMENTATION RATE: Sed Rate: 2 mm/h (ref 0–20)

## 2022-01-25 LAB — SJOGRENS SYNDROME-A EXTRACTABLE NUCLEAR ANTIBODY: SSA (Ro) (ENA) Antibody, IgG: <1 AI

## 2022-01-28 ENCOUNTER — Encounter: Payer: Self-pay | Admitting: Internal Medicine

## 2022-03-07 ENCOUNTER — Ambulatory Visit: Payer: BC Managed Care – PPO | Admitting: Family Medicine

## 2022-03-07 ENCOUNTER — Other Ambulatory Visit: Payer: Self-pay | Admitting: Family Medicine

## 2022-03-07 NOTE — Telephone Encounter (Signed)
Requested Prescriptions  Pending Prescriptions Disp Refills  . RABEprazole (ACIPHEX) 20 MG tablet [Pharmacy Med Name: RABEPRAZOLE SODIUM 20 MG DR TAB] 180 tablet 0    Sig: TAKE 2 TABLETS BY MOUTH ONCE DAILY     Gastroenterology: Proton Pump Inhibitors Passed - 03/07/2022  1:36 PM      Passed - Valid encounter within last 12 months    Recent Outpatient Visits          4 months ago Sjogren's syndrome, with unspecified organ involvement ([place])   [place], Megan P, DO   6 months ago Routine general medical examination at a health care facility   [facility], [place] P, DO   10 months ago Word finding difficulty   [place], [place], DO   1 year ago Mixed hyperlipidemia   [facility] [place], Megan P, DO   1 year ago Acute recurrent frontal sinusitis   [facility], Henrine Screws T, NP      Future Appointments            In 3 days Johnson, Megan P, DO [facility]   In 6 months Johnson, [place], DO [facility]           . buPROPion (WELLBUTRIN XL) 300 MG 24 hr tablet [Pharmacy Med Name: BUPROPION HCL ER (XL) 300 MG TAB] 90 tablet 0    Sig: TAKE 1 TABLET BY MOUTH ONCE DAILY     Psychiatry: Antidepressants - bupropion Passed - 03/07/2022  1:36 PM      Passed - Cr in normal range and within 360 days    Creatinine, Ser  Date Value Ref Range Status  09/05/2021 1.15 0.76 - 1.27 mg/dL Final         Passed - AST in normal range and within 360 days    AST  Date Value Ref Range Status  09/05/2021 17 0 - 40 IU/L Final   AST (SGOT) [place], Waived  Date Value Ref Range Status  09/13/2015 26 11 - 38 U/L Final         Passed - ALT in normal range and within 360 days    ALT  Date Value Ref Range Status  09/05/2021 25 0 - 44 IU/L Final   ALT (SGPT) Piccolo, Waived  Date Value Ref Range Status  09/13/2015 38 10 - 47 U/L Final         Passed - Completed  PHQ-2 or PHQ-9 in the last 360 days      Passed - Last BP in normal range    BP Readings from Last 1 Encounters:  01/24/22 120/67         Passed - Valid encounter within last 6 months    Recent Outpatient Visits          4 months ago Sjogren's syndrome, with unspecified organ involvement ([place])   [place], Megan P, DO   6 months ago Routine general medical examination at a health care facility   [facility], [place] P, DO   10 months ago Word finding difficulty   [place], [place], DO   1 year ago Mixed hyperlipidemia   [facility] [place], Megan P, DO   1 year ago Acute recurrent frontal sinusitis   [place] Venita Lick, NP      Future Appointments  In 3 days Valerie Roys, DO [facility]   In 6 months Wynetta Emery, Barb Merino, DO [facility]

## 2022-03-10 ENCOUNTER — Encounter: Payer: Self-pay | Admitting: Family Medicine

## 2022-03-10 ENCOUNTER — Ambulatory Visit: Payer: BC Managed Care – PPO | Admitting: Family Medicine

## 2022-03-10 VITALS — BP 132/75 | HR 66 | Temp 97.5°F | Wt 283.3 lb

## 2022-03-10 DIAGNOSIS — K219 Gastro-esophageal reflux disease without esophagitis: Secondary | ICD-10-CM

## 2022-03-10 DIAGNOSIS — B070 Plantar wart: Secondary | ICD-10-CM

## 2022-03-10 DIAGNOSIS — E782 Mixed hyperlipidemia: Secondary | ICD-10-CM | POA: Diagnosis not present

## 2022-03-10 DIAGNOSIS — Z23 Encounter for immunization: Secondary | ICD-10-CM

## 2022-03-10 DIAGNOSIS — F321 Major depressive disorder, single episode, moderate: Secondary | ICD-10-CM | POA: Diagnosis not present

## 2022-03-10 DIAGNOSIS — R41840 Attention and concentration deficit: Secondary | ICD-10-CM | POA: Diagnosis not present

## 2022-03-10 MED ORDER — TRAZODONE HCL 50 MG PO TABS: 100.0000 mg | ORAL_TABLET | Freq: Every day | ORAL | 3 refills | Status: DC

## 2022-03-10 MED ORDER — BUPROPION HCL ER (XL) 300 MG PO TB24: 300.0000 mg | ORAL_TABLET | Freq: Every day | ORAL | 1 refills | Status: DC

## 2022-03-10 MED ORDER — TADALAFIL 10 MG PO TABS: 10.0000 mg | ORAL_TABLET | Freq: Every day | ORAL | 3 refills | Status: DC | PRN

## 2022-03-10 MED ORDER — SIMVASTATIN 40 MG PO TABS: 40.0000 mg | ORAL_TABLET | Freq: Every day | ORAL | 1 refills | Status: DC

## 2022-03-10 MED ORDER — TAMSULOSIN HCL 0.4 MG PO CAPS: 0.8000 mg | ORAL_CAPSULE | Freq: Every day | ORAL | 3 refills | Status: DC

## 2022-03-10 MED ORDER — RABEPRAZOLE SODIUM 20 MG PO TBEC: 40.0000 mg | DELAYED_RELEASE_TABLET | Freq: Every day | ORAL | 1 refills | Status: DC

## 2022-03-10 MED ORDER — DULOXETINE HCL 20 MG PO CPEP: 20.0000 mg | ORAL_CAPSULE | Freq: Every day | ORAL | 1 refills | Status: DC

## 2022-03-10 MED ORDER — GABAPENTIN 300 MG PO CAPS: ORAL_CAPSULE | ORAL | 4 refills | Status: DC

## 2022-03-10 MED ORDER — COENZYME Q10 400 MG PO CAPS: 400.0000 mg | ORAL_CAPSULE | Freq: Every day | ORAL | 4 refills | Status: DC

## 2022-03-10 MED ORDER — MELOXICAM 15 MG PO TABS: 15.0000 mg | ORAL_TABLET | Freq: Every day | ORAL | 1 refills | Status: DC

## 2022-03-10 NOTE — Progress Notes (Signed)
BP 132/75   Pulse 66   Temp (!) 97.5 F (36.4 C)   Wt 283 lb 4.8 oz (128.5 kg)   SpO2 95%   BMI 40.07 kg/m    Subjective:    Patient ID: Curtis Ran., male    DOB: 1961/02/12, 61 y.o.   MRN: [id number]  HPI: Curtis Bray is a 61 y.o. male  Chief Complaint  Patient presents with   Depression   Hyperlipidemia   Memory concern    Patient states he is concerned about his memory, states he is talking mid sentence and can't complete a sentence sometimes.    Has been finding himself getting distracted. Has been going to work without thinking about it. He feels like he's not as clear and sharp as he should be. He feels like he has too much going on. He feels like he can't get anything right anymore. + word finding trouble. Having issues more trouble finishing a sentence- finding the right word.   DEPRESSION- has not been taking his cymbalta Mood status: stable Satisfied with current treatment?:  unsure Symptom severity: moderate  Duration of current treatment : chronic Side effects: no Medication compliance: good compliance Psychotherapy/counseling: no  Previous psychiatric medications: wellbutrin, cymbalta Depressed mood: yes Anxious mood: yes Anhedonia: no Significant weight loss or gain: no Insomnia: no  Fatigue: yes Feelings of worthlessness or guilt: no Impaired concentration/indecisiveness: no Suicidal ideations: no Hopelessness: no Crying spells: no    03/10/2022    2:09 PM 10/10/2021    4:18 PM 09/06/2021    2:43 PM 05/02/2021    4:14 PM 01/25/2021    8:24 AM  Depression screen PHQ 2/9  Decreased Interest 1 1 0 1 1  Down, Depressed, Hopeless 0 0 0 0 0  PHQ - 2 Score 1 1 0 1 1  Altered sleeping 0 0 0 0 0  Tired, decreased energy '2 3 2 3 1  '$ Change in appetite 1 0 2 2 0  Feeling bad or failure about yourself  0 0 0 0 0  Trouble concentrating '1 1 3 2 3  '$ Moving slowly or fidgety/restless 2 1 0 3 3  Suicidal thoughts 0 0 0 0 0  PHQ-9 Score '7 6 7 11 8   '$ Difficult doing work/chores Somewhat difficult    Somewhat difficult   HYPERLIPIDEMIA Hyperlipidemia status: excellent compliance Satisfied with current treatment?  yes Side effects:  no Medication compliance: excellent compliance Past cholesterol meds: simvastatin Supplements: none Aspirin:  no The 10-year ASCVD risk score (Arnett DK, et al., 2019) is: 8.5%   Values used to calculate the score:     Age: 62 years     Sex: Male     Is Non-Hispanic African American: No     Diabetic: No     Tobacco smoker: No     Systolic Blood Pressure: 009 mmHg     Is BP treated: No     HDL Cholesterol: 44 mg/dL     Total Cholesterol: 153 mg/dL Chest pain:  no Coronary artery disease:  no  GERD GERD control status: controlled Heartburn frequency: occasionally Medication side effects: no  Medication compliance: stable Dysphagia: no Odynophagia:  no Hematemesis: no Blood in stool: no EGD: no  Relevant past medical, surgical, family and social history reviewed and updated as indicated. Interim medical history since our last visit reviewed. Allergies and medications reviewed and updated.  Review of Systems  Constitutional: Negative.   Respiratory: Negative.  Cardiovascular: Negative.   Gastrointestinal: Negative.   Musculoskeletal:  Positive for arthralgias. Negative for back pain, gait problem, joint swelling, myalgias, neck pain and neck stiffness.  Psychiatric/Behavioral:  Positive for dysphoric mood. Negative for agitation, behavioral problems, confusion, decreased concentration, hallucinations, self-injury, sleep disturbance and suicidal ideas. The patient is nervous/anxious. The patient is not hyperactive.     Per HPI unless specifically indicated above     Objective:    BP 132/75   Pulse 66   Temp (!) 97.5 F (36.4 C)   Wt 283 lb 4.8 oz (128.5 kg)   SpO2 95%   BMI 40.07 kg/m   Wt Readings from Last 3 Encounters:  03/10/22 283 lb 4.8 oz (128.5 kg)  01/24/22 282 lb  (127.9 kg)  10/31/21 286 lb 9.6 oz (130 kg)    Physical Exam Vitals and nursing note reviewed.  Constitutional:      General: He is not in acute distress.    Appearance: Normal appearance. He is not ill-appearing, toxic-appearing or diaphoretic.  HENT:     Head: Normocephalic and atraumatic.     Right Ear: External ear normal.     Left Ear: External ear normal.     Nose: Nose normal.     Mouth/Throat:     Mouth: Mucous membranes are moist.     Pharynx: Oropharynx is clear.  Eyes:     General: No scleral icterus.       Right eye: No discharge.        Left eye: No discharge.     Extraocular Movements: Extraocular movements intact.     Conjunctiva/sclera: Conjunctivae normal.     Pupils: Pupils are equal, round, and reactive to light.  Cardiovascular:     Rate and Rhythm: Normal rate and regular rhythm.     Pulses: Normal pulses.     Heart sounds: Normal heart sounds. No murmur heard.    No friction rub. No gallop.  Pulmonary:     Effort: Pulmonary effort is normal. No respiratory distress.     Breath sounds: Normal breath sounds. No stridor. No wheezing, rhonchi or rales.  Chest:     Chest wall: No tenderness.  Musculoskeletal:        General: Normal range of motion.     Cervical back: Normal range of motion and neck supple.  Skin:    General: Skin is warm and dry.     Capillary Refill: Capillary refill takes less than 2 seconds.     Coloration: Skin is not jaundiced or pale.     Findings: No bruising, erythema, lesion or rash.  Neurological:     General: No focal deficit present.     Mental Status: He is alert and oriented to person, place, and time. Mental status is at baseline.  Psychiatric:        Mood and Affect: Mood normal.        Behavior: Behavior normal.        Thought Content: Thought content normal.        Judgment: Judgment normal.     Results for orders placed or performed in visit on 01/24/22  Sjogrens syndrome-A extractable nuclear antibody  Result  Value Ref Range   SSA (Ro) (ENA) Antibody, IgG <1.0 NEG <1.0 NEG AI  Sjogrens syndrome-B extractable nuclear antibody  Result Value Ref Range   SSB (La) (ENA) Antibody, IgG 1.2 POS (A) <1.0 NEG AI  C3 and C4  Result Value Ref Range   C3 Complement  148 82 - 185 mg/dL   C4 Complement 23 15 - 53 mg/dL  Rheumatoid factor  Result Value Ref Range   Rhuematoid fact SerPl-aCnc <14 <14 IU/mL  Sedimentation rate  Result Value Ref Range   Sed Rate 2 0 - 20 mm/h  C-reactive protein  Result Value Ref Range   CRP 1.1 <8.0 mg/L      Assessment & Plan:   Problem List Items Addressed This Visit       Digestive   GERD (gastroesophageal reflux disease)    Under good control on current regimen. Continue current regimen. Continue to monitor. Call with any concerns. Refills given. Labs drawn today.       Relevant Medications   RABEprazole (ACIPHEX) 20 MG tablet     Other   Hyperlipidemia    Under good control on current regimen. Continue current regimen. Continue to monitor. Call with any concerns. Refills given. Labs drawn today.       Relevant Medications   simvastatin (ZOCOR) 40 MG tablet   tadalafil (CIALIS) 10 MG tablet   Depression, major, single episode, moderate (HCC) - Primary    Has not been taking his cymbalta. Will restart and recheck in about 2 months. Call with any concerns. Will also get him in for neuropsych testing to look for cause. Call with any concerns.       Relevant Medications   DULoxetine (CYMBALTA) 20 MG capsule   buPROPion (WELLBUTRIN XL) 300 MG 24 hr tablet   traZODone (DESYREL) 50 MG tablet   Other Visit Diagnoses     Concentration deficit       Has not been taking his cymbalta. Will restart and recheck in about 2 months. Call with any concerns. Will also get him in for neuropsych testing.   Relevant Orders   Ambulatory referral to Neuropsychology   Plantar wart       Will refer to podiatry. Call with any concerns.    Relevant Orders   Ambulatory  referral to Podiatry   Need for influenza vaccination       Flu shot given today.   Relevant Orders   Flu Vaccine QUAD 6+ mos PF IM (Fluarix Quad PF)        Follow up plan: Return in about 2 months (around 05/10/2022).

## 2022-03-10 NOTE — Patient Instructions (Signed)
Neuropsych testing with a psychologist- ?ADD

## 2022-03-10 NOTE — Assessment & Plan Note (Signed)
Under good control on current regimen. Continue current regimen. Continue to monitor. Call with any concerns. Refills given. Labs drawn today.   

## 2022-03-10 NOTE — Assessment & Plan Note (Signed)
Has not been taking his cymbalta. Will restart and recheck in about 2 months. Call with any concerns. Will also get him in for neuropsych testing to look for cause. Call with any concerns.

## 2022-03-14 ENCOUNTER — Ambulatory Visit: Payer: BC Managed Care – PPO | Admitting: Podiatry

## 2022-03-14 ENCOUNTER — Ambulatory Visit (INDEPENDENT_AMBULATORY_CARE_PROVIDER_SITE_OTHER): Payer: BC Managed Care – PPO

## 2022-03-14 DIAGNOSIS — M722 Plantar fascial fibromatosis: Secondary | ICD-10-CM

## 2022-03-14 MED ORDER — METHYLPREDNISOLONE 4 MG PO TBPK: ORAL_TABLET | ORAL | 0 refills | Status: DC

## 2022-03-14 NOTE — Progress Notes (Signed)
Chief Complaint  Patient presents with   Foot Pain    Patient states that he has had left foot pain several months that feel like it is a sharp pain.    Subjective: 61 y.o. male presenting today for evaluation of left heel pain has been going on for about 3 months now.  Denies a history of injury.  Most severe in the mornings when getting out of bed.  He has not done anything for treatment.  He does take meloxicam chronically for degenerative disc disease.    Past Medical History:  Diagnosis Date   Allergy    Cancer [facility])    Prostate   Carpal tunnel syndrome    DDD (degenerative disc disease), cervical    DDD (degenerative disc disease), cervical    Deafness in left ear    Dyspnea    with exertion   Edema    GERD (gastroesophageal reflux disease)    History of kidney stones    passed   Hyperlipidemia    Personal history of colonic polyps    Pinched nerve    upper and lower back   Prostate cancer ([place])    Sleep apnea    Bi-pap   Sleep apnea    Stroke ([place])    2013, Blind eye   TIA (transient ischemic attack) 23/5573   Umbilical hernia    Vision impairment    Blindness Right Eye   Past Surgical History:  Procedure Laterality Date   ANTERIOR CERVICAL DECOMP/DISCECTOMY FUSION N/A 03/30/2020   Procedure: Cervical Four-Five/Cervical Five-Six / Cervical Six-Seven  Anterior cervical decompression/discectomy/fusion;  Surgeon: Erline Levine, MD;  Location: [place];  Service: Neurosurgery;  Laterality: N/A;  3C/RM 21   APPENDECTOMY     carpel tunnel Bilateral    COLONOSCOPY  2015   Found 2 beign polyps, repeat 10 years   COLONOSCOPY WITH PROPOFOL N/A 03/30/2019   Procedure: COLONOSCOPY WITH PROPOFOL;  Surgeon: [name], MD;  Location: [facility] ENDOSCOPY;  Service: Gastroenterology;  Laterality: N/A;   ESOPHAGOGASTRODUODENOSCOPY (EGD) WITH PROPOFOL N/A 03/30/2019   Procedure: ESOPHAGOGASTRODUODENOSCOPY (EGD) WITH PROPOFOL;  Surgeon: [name], MD;  Location: [facility] ENDOSCOPY;   Service: Gastroenterology;  Laterality: N/A;   POSTERIOR CERVICAL LAMINECTOMY WITH MET- RX Right 05/11/2020   Procedure: RIGHT CERVICAL FOUR-FIVE, CERVICAL FIVE-SIX FORAMINOTOMY WITH METRX;  Surgeon: Erline Levine, MD;  Location: [place];  Service: Neurosurgery;  Laterality: Right;  3C/RM 21   RADIOACTIVE SEED IMPLANT N/A 12/13/2018   Procedure: RADIOACTIVE SEED IMPLANT/BRACHYTHERAPY IMPLANT;  Surgeon: Billey Co, MD;  Location: [facility] ORS;  Service: Urology;  Laterality: N/A;   TONSILLECTOMY     No Known Allergies   Objective: Physical Exam General: The patient is alert and oriented x3 in no acute distress.  Dermatology: Skin is warm, dry and supple bilateral lower extremities. Negative for open lesions or macerations bilateral.   Vascular: Dorsalis Pedis and Posterior Tibial pulses palpable bilateral.  Capillary fill time is immediate to all digits.  Neurological: Epicritic and protective threshold intact bilateral.   Musculoskeletal: Tenderness to palpation to the plantar aspect of the left heel along the plantar fascia. All other joints range of motion within normal limits bilateral. Strength 5/5 in all groups bilateral.   Radiographic exam: Normal osseous mineralization. Joint spaces preserved. No fracture/dislocation/boney destruction. No other soft tissue abnormalities or radiopaque foreign bodies.   Assessment: 1. Plantar fasciitis left foot  Plan of Care:  1. Patient evaluated. Xrays reviewed.   2.  Patient declined injection  today 3. Rx for Medrol Dose Pak placed 4.  Patient has meloxicam already.  Continue after completion of the Dosepak as prescribed 5. Plantar fascial band(s) dispensed  6. Instructed patient regarding therapies and modalities at home to alleviate symptoms.  7.  Appointment with orthotics department for custom molded orthotics.  He has some current custom molded orthotics that are about 49 years old  46.  Return to clinic in 4 weeks.     *Teacher/professor for architecture, drafting, and design  Edrick Kins, DPM [facility]  Dr. Edrick Kins, DPM    [address], [place] [address]                Office [phone]  Fax [phone]

## 2022-03-18 ENCOUNTER — Telehealth: Payer: Self-pay | Admitting: *Deleted

## 2022-03-18 ENCOUNTER — Ambulatory Visit (INDEPENDENT_AMBULATORY_CARE_PROVIDER_SITE_OTHER): Payer: BC Managed Care – PPO

## 2022-03-18 DIAGNOSIS — M722 Plantar fascial fibromatosis: Secondary | ICD-10-CM

## 2022-03-18 NOTE — Telephone Encounter (Signed)
Patient is calling because he thought his prescription had not been sent to pharmacy in [place] with pharmacy, said that patient did pick up prescription (medrol dosepack),and that the patient may not have understood that this is a form of prednisone. Called patient to inform,no answer,left voice message and to call back if any further questions.

## 2022-03-18 NOTE — Telephone Encounter (Signed)
Patient called back and gave him information per pharmacy that he had picked up the prescription by Dr Amalia Hailey.verbalized understanding.

## 2022-03-18 NOTE — Progress Notes (Signed)
Patient presents today to be casted for custom molded orthotics. Dr. Amalia Hailey has been treating patient for plantar fasciitis.   Impression foam cast was taken. ABN signed.  Patient info-  Shoe size: 10.5 wide men's   Shoe style: Causal   Height: 5'10.5"  Weight: 275 lbs   Insurance: [employer]   Patient will be notified once orthotics arrive in office and reappoint for fitting at that time.

## 2022-04-11 ENCOUNTER — Ambulatory Visit: Payer: BC Managed Care – PPO | Admitting: Podiatry

## 2022-04-11 DIAGNOSIS — M722 Plantar fascial fibromatosis: Secondary | ICD-10-CM

## 2022-04-11 MED ORDER — BETAMETHASONE SOD PHOS & ACET 6 (3-3) MG/ML IJ SUSP
3.0000 mg | Freq: Once | INTRAMUSCULAR | Status: AC
  Administered 2022-04-11: 3 mg via INTRA_ARTICULAR

## 2022-04-11 NOTE — Progress Notes (Signed)
Chief Complaint  Patient presents with   Follow-up    Patient is here for follow-up for left foot plantar fasciitis.    Subjective: 61 y.o. male presenting today for  follow-up evaluation of left heel pain has been going on for about 4 months now.  Patient states that the Medrol dose pack helps temporarily for about a week but then the pain returned.  He continues to take the meloxicam chronically for degenerative disc disease.  Past Medical History:  Diagnosis Date   Allergy    Cancer [facility])    Prostate   Carpal tunnel syndrome    DDD (degenerative disc disease), cervical    DDD (degenerative disc disease), cervical    Deafness in left ear    Dyspnea    with exertion   Edema    GERD (gastroesophageal reflux disease)    History of kidney stones    passed   Hyperlipidemia    Personal history of colonic polyps    Pinched nerve    upper and lower back   Prostate cancer ([place])    Sleep apnea    Bi-pap   Sleep apnea    Stroke ([place])    2013, Blind eye   TIA (transient ischemic attack) 38/1771   Umbilical hernia    Vision impairment    Blindness Right Eye   Past Surgical History:  Procedure Laterality Date   ANTERIOR CERVICAL DECOMP/DISCECTOMY FUSION N/A 03/30/2020   Procedure: Cervical Four-Five/Cervical Five-Six / Cervical Six-Seven  Anterior cervical decompression/discectomy/fusion;  Surgeon: Erline Levine, MD;  Location: [place];  Service: Neurosurgery;  Laterality: N/A;  3C/RM 21   APPENDECTOMY     carpel tunnel Bilateral    COLONOSCOPY  2015   Found 2 beign polyps, repeat 10 years   COLONOSCOPY WITH PROPOFOL N/A 03/30/2019   Procedure: COLONOSCOPY WITH PROPOFOL;  Surgeon: [name], MD;  Location: [facility] ENDOSCOPY;  Service: Gastroenterology;  Laterality: N/A;   ESOPHAGOGASTRODUODENOSCOPY (EGD) WITH PROPOFOL N/A 03/30/2019   Procedure: ESOPHAGOGASTRODUODENOSCOPY (EGD) WITH PROPOFOL;  Surgeon: [name], MD;  Location: [facility] ENDOSCOPY;  Service: Gastroenterology;   Laterality: N/A;   POSTERIOR CERVICAL LAMINECTOMY WITH MET- RX Right 05/11/2020   Procedure: RIGHT CERVICAL FOUR-FIVE, CERVICAL FIVE-SIX FORAMINOTOMY WITH METRX;  Surgeon: Erline Levine, MD;  Location: [place];  Service: Neurosurgery;  Laterality: Right;  3C/RM 21   RADIOACTIVE SEED IMPLANT N/A 12/13/2018   Procedure: RADIOACTIVE SEED IMPLANT/BRACHYTHERAPY IMPLANT;  Surgeon: Billey Co, MD;  Location: [facility] ORS;  Service: Urology;  Laterality: N/A;   TONSILLECTOMY     No Known Allergies   Objective: Physical Exam General: The patient is alert and oriented x3 in no acute distress.  Dermatology: Skin is warm, dry and supple bilateral lower extremities. Negative for open lesions or macerations bilateral.   Vascular: Dorsalis Pedis and Posterior Tibial pulses palpable bilateral.  Capillary fill time is immediate to all digits.  Neurological: Epicritic and protective threshold intact bilateral.   Musculoskeletal: There continues to be tenderness to palpation to the plantar aspect of the left heel along the plantar fascia. All other joints range of motion within normal limits bilateral. Strength 5/5 in all groups bilateral.   Radiographic exam LT foot 03/14/2022: Normal osseous mineralization. Joint spaces preserved. No fracture/dislocation/boney destruction. No other soft tissue abnormalities or radiopaque foreign bodies.   Assessment: 1. Plantar fasciitis left foot  Plan of Care:  1. Patient evaluated. Xrays reviewed.   2.  Injection of 0.5 cc Celestone Soluspan injected into the plantar  fascia left 3.  Continue meloxicam and gabapentin as prescribed from different provider 5.  Continue plantar fascial brace 6.  Custom orthotics pending.  He has already been fitted for them.  In the meantime continue old orthotics 7.  Return to clinic 6 weeks after wearing the orthotics  *Teacher/professor for architecture, drafting, and design  Edrick Kins, DPM [facility]  Dr.  Edrick Kins, DPM    [address], [place] [address]                Office [phone]  Fax [phone]

## 2022-05-09 ENCOUNTER — Ambulatory Visit: Payer: BC Managed Care – PPO | Admitting: Podiatry

## 2022-05-12 ENCOUNTER — Encounter: Payer: Self-pay | Admitting: Family Medicine

## 2022-05-12 ENCOUNTER — Ambulatory Visit: Payer: BC Managed Care – PPO | Admitting: Family Medicine

## 2022-05-12 VITALS — BP 134/82 | HR 75 | Temp 98.7°F | Ht 70.5 in | Wt 255.9 lb

## 2022-05-12 DIAGNOSIS — F321 Major depressive disorder, single episode, moderate: Secondary | ICD-10-CM | POA: Diagnosis not present

## 2022-05-12 MED ORDER — DULOXETINE HCL 20 MG PO CPEP: 40.0000 mg | ORAL_CAPSULE | Freq: Every day | ORAL | 1 refills | Status: DC

## 2022-05-12 NOTE — Assessment & Plan Note (Signed)
Not doing significantly better. Will increase his cymbalta to '40mg'$  and recheck 1 month. Call with any concerns.

## 2022-05-12 NOTE — Progress Notes (Signed)
BP 134/82   Pulse 75   Temp 98.7 F (37.1 C) (Oral)   Ht 5' 10.5" (1.791 m)   Wt 255 lb 14.4 oz (116.1 kg)   SpO2 96%   BMI 36.20 kg/m    Subjective:    Patient ID: Curtis Ran., male    DOB: Sep 07, 1960, 61 y.o.   MRN: [id number]  HPI: Curtis Baldree. is a 61 y.o. male  Chief Complaint  Patient presents with   Depression   DEPRESSION Mood status: stable Satisfied with current treatment?: no Symptom severity: moderate  Duration of current treatment : chronic Side effects: no Medication compliance: excellent compliance Psychotherapy/counseling: no  Previous psychiatric medications: wellbutrrin, cymbalta Depressed mood: yes Anxious mood: yes Anhedonia: no Significant weight loss or gain: no Insomnia: no  Fatigue: yes Feelings of worthlessness or guilt: no Impaired concentration/indecisiveness: no Suicidal ideations: no Hopelessness: no Crying spells: no    05/12/2022    4:04 PM 03/10/2022    2:09 PM 10/10/2021    4:18 PM 09/06/2021    2:43 PM 05/02/2021    4:14 PM  Depression screen PHQ 2/9  Decreased Interest '1 1 1 '$ 0 1  Down, Depressed, Hopeless 0 0 0 0 0  PHQ - 2 Score '1 1 1 '$ 0 1  Altered sleeping 0 0 0 0 0  Tired, decreased energy '2 2 3 2 3  '$ Change in appetite 1 1 0 2 2  Feeling bad or failure about yourself  0 0 0 0 0  Trouble concentrating '2 1 1 3 2  '$ Moving slowly or fidgety/restless '1 2 1 '$ 0 3  Suicidal thoughts 0 0 0 0 0  PHQ-9 Score '7 7 6 7 11  '$ Difficult doing work/chores Somewhat difficult Somewhat difficult       Relevant past medical, surgical, family and social history reviewed and updated as indicated. Interim medical history since our last visit reviewed. Allergies and medications reviewed and updated.  Review of Systems  Constitutional: Negative.   Respiratory: Negative.    Cardiovascular: Negative.   Musculoskeletal: Negative.   Psychiatric/Behavioral:  Positive for decreased concentration and dysphoric mood. Negative for  agitation, behavioral problems, confusion, hallucinations, self-injury, sleep disturbance and suicidal ideas. The patient is not nervous/anxious and is not hyperactive.     Per HPI unless specifically indicated above     Objective:    BP 134/82   Pulse 75   Temp 98.7 F (37.1 C) (Oral)   Ht 5' 10.5" (1.791 m)   Wt 255 lb 14.4 oz (116.1 kg)   SpO2 96%   BMI 36.20 kg/m   Wt Readings from Last 3 Encounters:  05/12/22 255 lb 14.4 oz (116.1 kg)  03/10/22 283 lb 4.8 oz (128.5 kg)  01/24/22 282 lb (127.9 kg)    Physical Exam Vitals and nursing note reviewed.  Constitutional:      General: He is not in acute distress.    Appearance: Normal appearance. He is obese. He is not ill-appearing, toxic-appearing or diaphoretic.  HENT:     Head: Normocephalic and atraumatic.     Right Ear: External ear normal.     Left Ear: External ear normal.     Nose: Nose normal.     Mouth/Throat:     Mouth: Mucous membranes are moist.     Pharynx: Oropharynx is clear.  Eyes:     General: No scleral icterus.       Right eye: No discharge.  Left eye: No discharge.     Extraocular Movements: Extraocular movements intact.     Conjunctiva/sclera: Conjunctivae normal.     Pupils: Pupils are equal, round, and reactive to light.  Cardiovascular:     Rate and Rhythm: Normal rate and regular rhythm.     Pulses: Normal pulses.     Heart sounds: Normal heart sounds. No murmur heard.    No friction rub. No gallop.  Pulmonary:     Effort: Pulmonary effort is normal. No respiratory distress.     Breath sounds: Normal breath sounds. No stridor. No wheezing, rhonchi or rales.  Chest:     Chest wall: No tenderness.  Musculoskeletal:        General: Normal range of motion.     Cervical back: Normal range of motion and neck supple.  Skin:    General: Skin is warm and dry.     Capillary Refill: Capillary refill takes less than 2 seconds.     Coloration: Skin is not jaundiced or pale.     Findings: No  bruising, erythema, lesion or rash.  Neurological:     General: No focal deficit present.     Mental Status: He is alert and oriented to person, place, and time. Mental status is at baseline.  Psychiatric:        Mood and Affect: Mood normal.        Behavior: Behavior normal.        Thought Content: Thought content normal.        Judgment: Judgment normal.     Results for orders placed or performed in visit on 01/24/22  Sjogrens syndrome-A extractable nuclear antibody  Result Value Ref Range   SSA (Ro) (ENA) Antibody, IgG <1.0 NEG <1.0 NEG AI  Sjogrens syndrome-B extractable nuclear antibody  Result Value Ref Range   SSB (La) (ENA) Antibody, IgG 1.2 POS (A) <1.0 NEG AI  C3 and C4  Result Value Ref Range   C3 Complement 148 82 - 185 mg/dL   C4 Complement 23 15 - 53 mg/dL  Rheumatoid factor  Result Value Ref Range   Rhuematoid fact SerPl-aCnc <14 <14 IU/mL  Sedimentation rate  Result Value Ref Range   Sed Rate 2 0 - 20 mm/h  C-reactive protein  Result Value Ref Range   CRP 1.1 <8.0 mg/L      Assessment & Plan:   Problem List Items Addressed This Visit       Other   Depression, major, single episode, moderate ([place]) - Primary    Not doing significantly better. Will increase his cymbalta to '40mg'$  and recheck 1 month. Call with any concerns.       Relevant Medications   DULoxetine (CYMBALTA) 20 MG capsule     Follow up plan: Return in about 4 weeks (around 06/09/2022).

## 2022-05-13 ENCOUNTER — Telehealth: Payer: Self-pay | Admitting: Podiatry

## 2022-05-13 NOTE — Telephone Encounter (Signed)
Left message on vm for pt to call back to pick up orthotics- being sent from [facility] office to [facility] 12/12  No Balance

## 2022-05-21 ENCOUNTER — Ambulatory Visit (INDEPENDENT_AMBULATORY_CARE_PROVIDER_SITE_OTHER): Payer: BC Managed Care – PPO

## 2022-05-21 NOTE — Progress Notes (Signed)
Patient presents today to pick up custom molded foot orthotics, diagnosed with plantar fasciitis by Dr. Amalia Hailey.   Orthotics were dispensed and fit was satisfactory. Reviewed instructions for break-in and wear. Written instructions given to patient.  Patient will follow up as needed.   [facility] - order # [id number]

## 2022-05-25 IMAGING — DX DG THORACIC SPINE 3V
3 series · 3 of 3 positions shown · non-contrast
Comparison: None.

CLINICAL DATA: Thoracic pain for 10 years

EXAM:
THORACIC SPINE - 3 VIEWS

[t-spine ap]
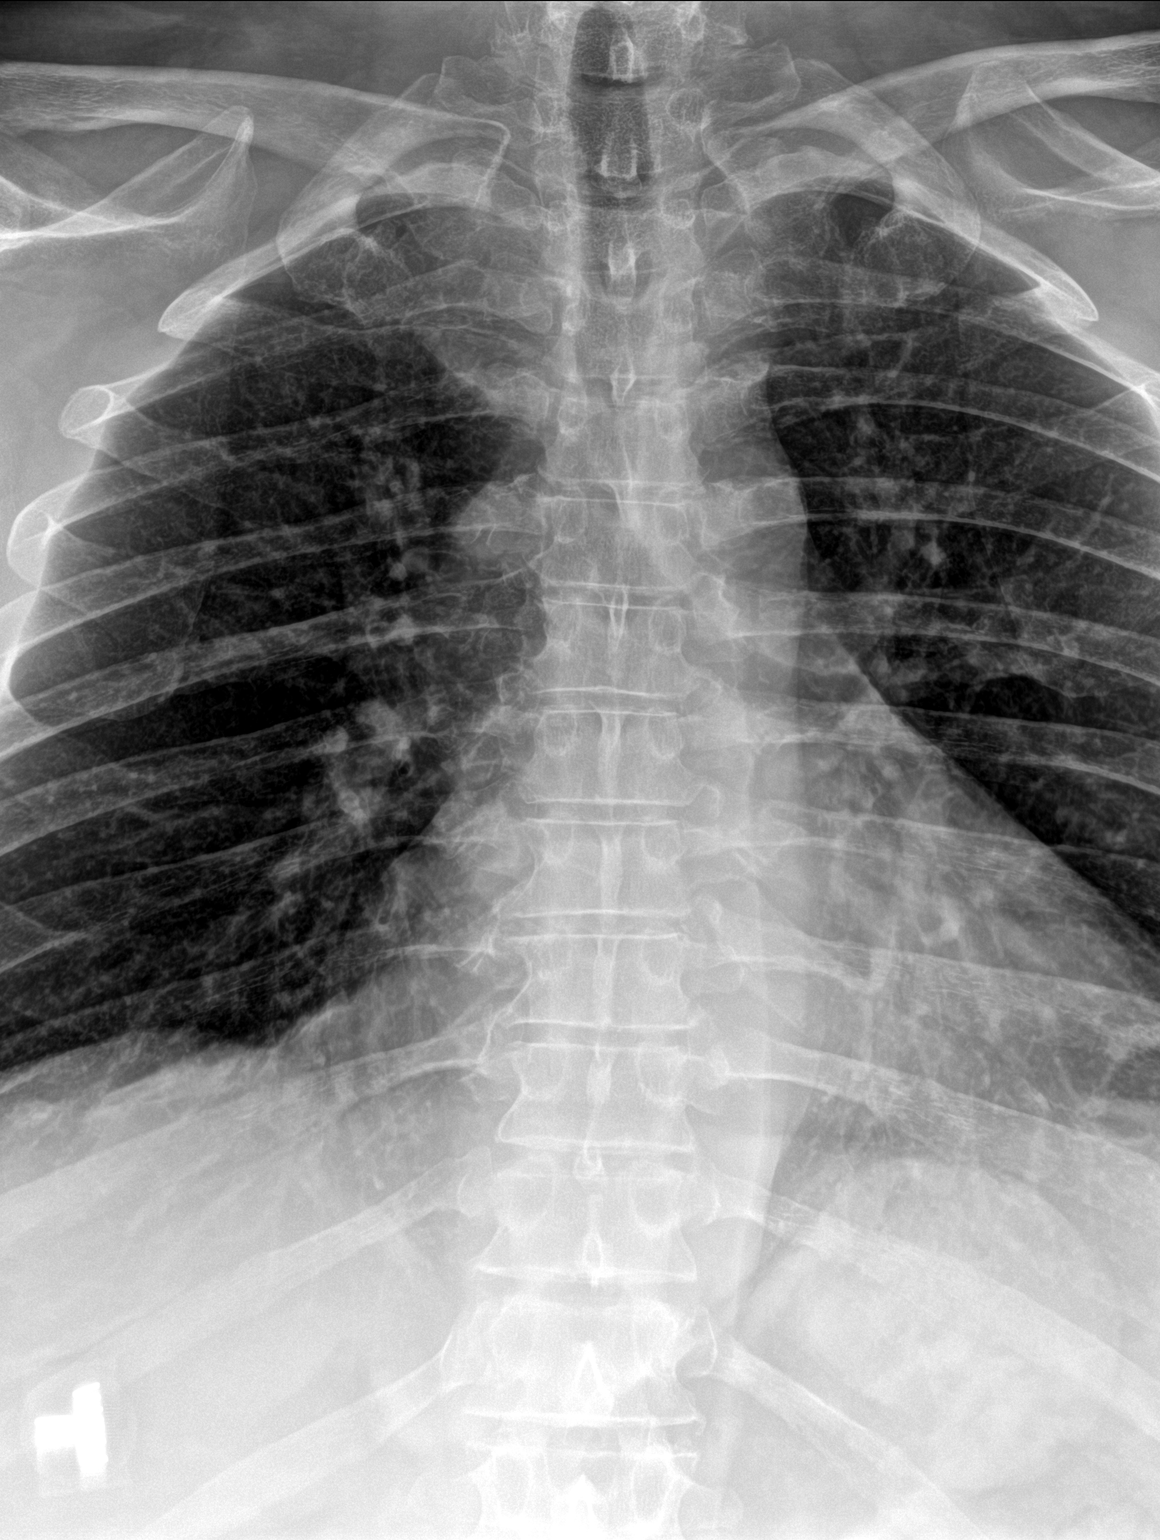

[t-spine lat]
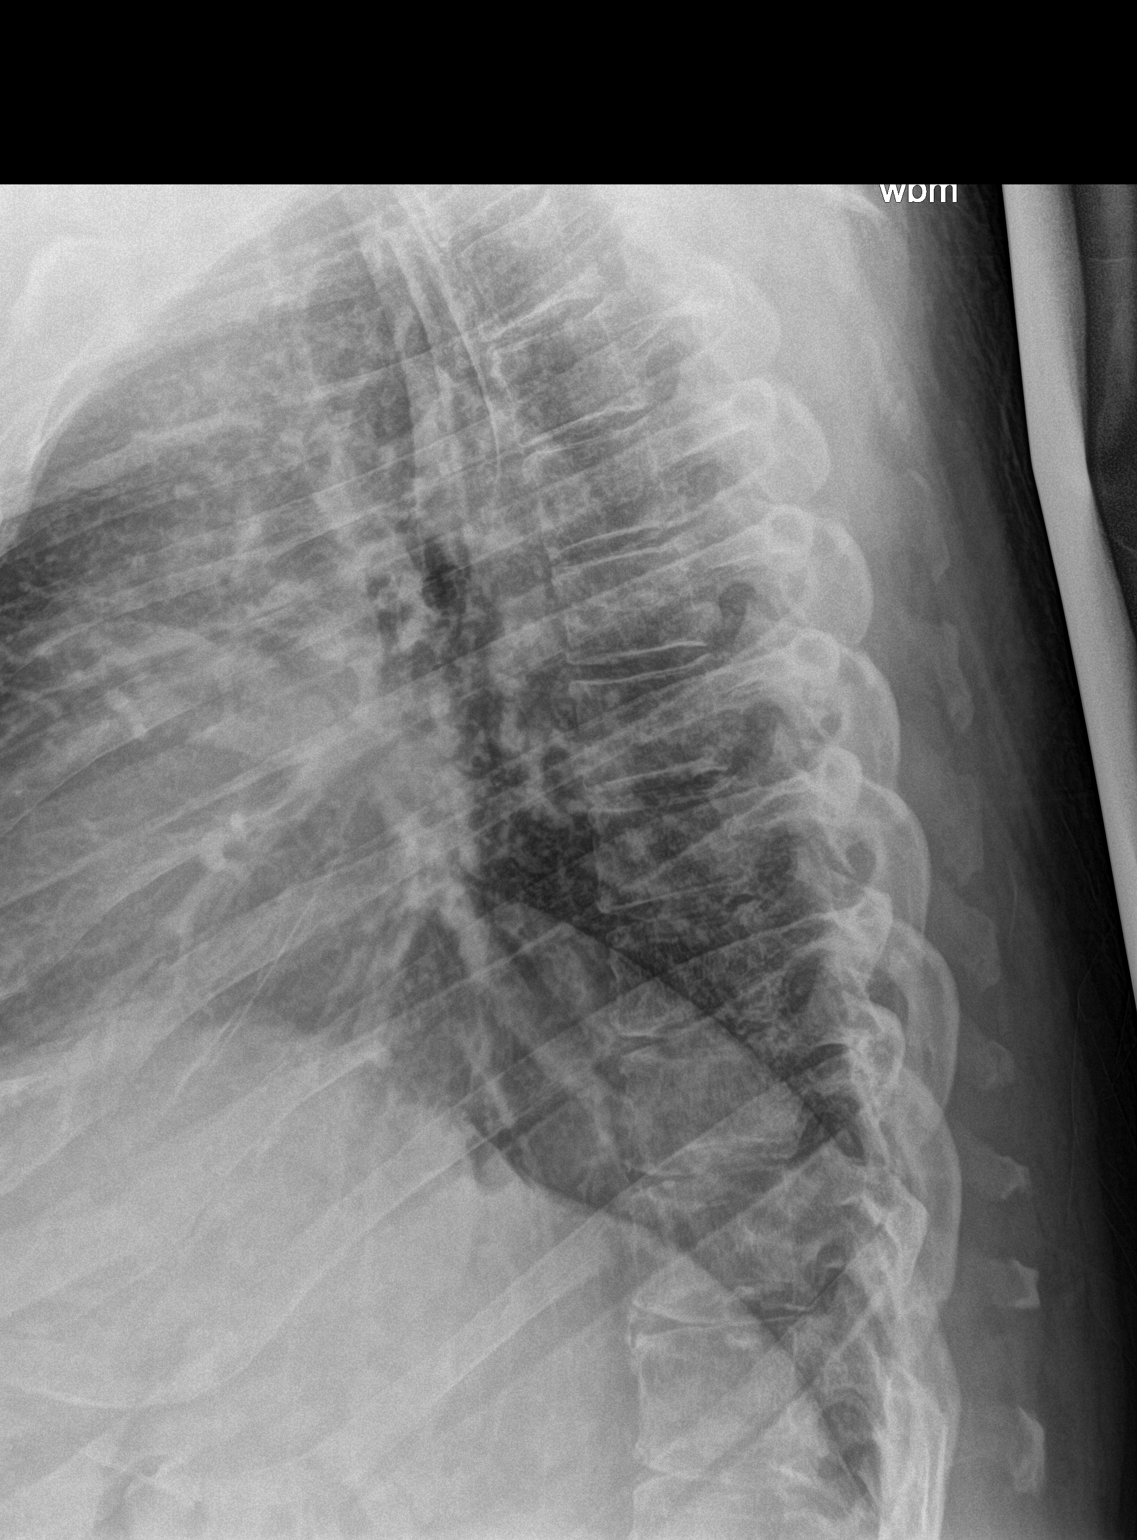

[t-spine swimmers]
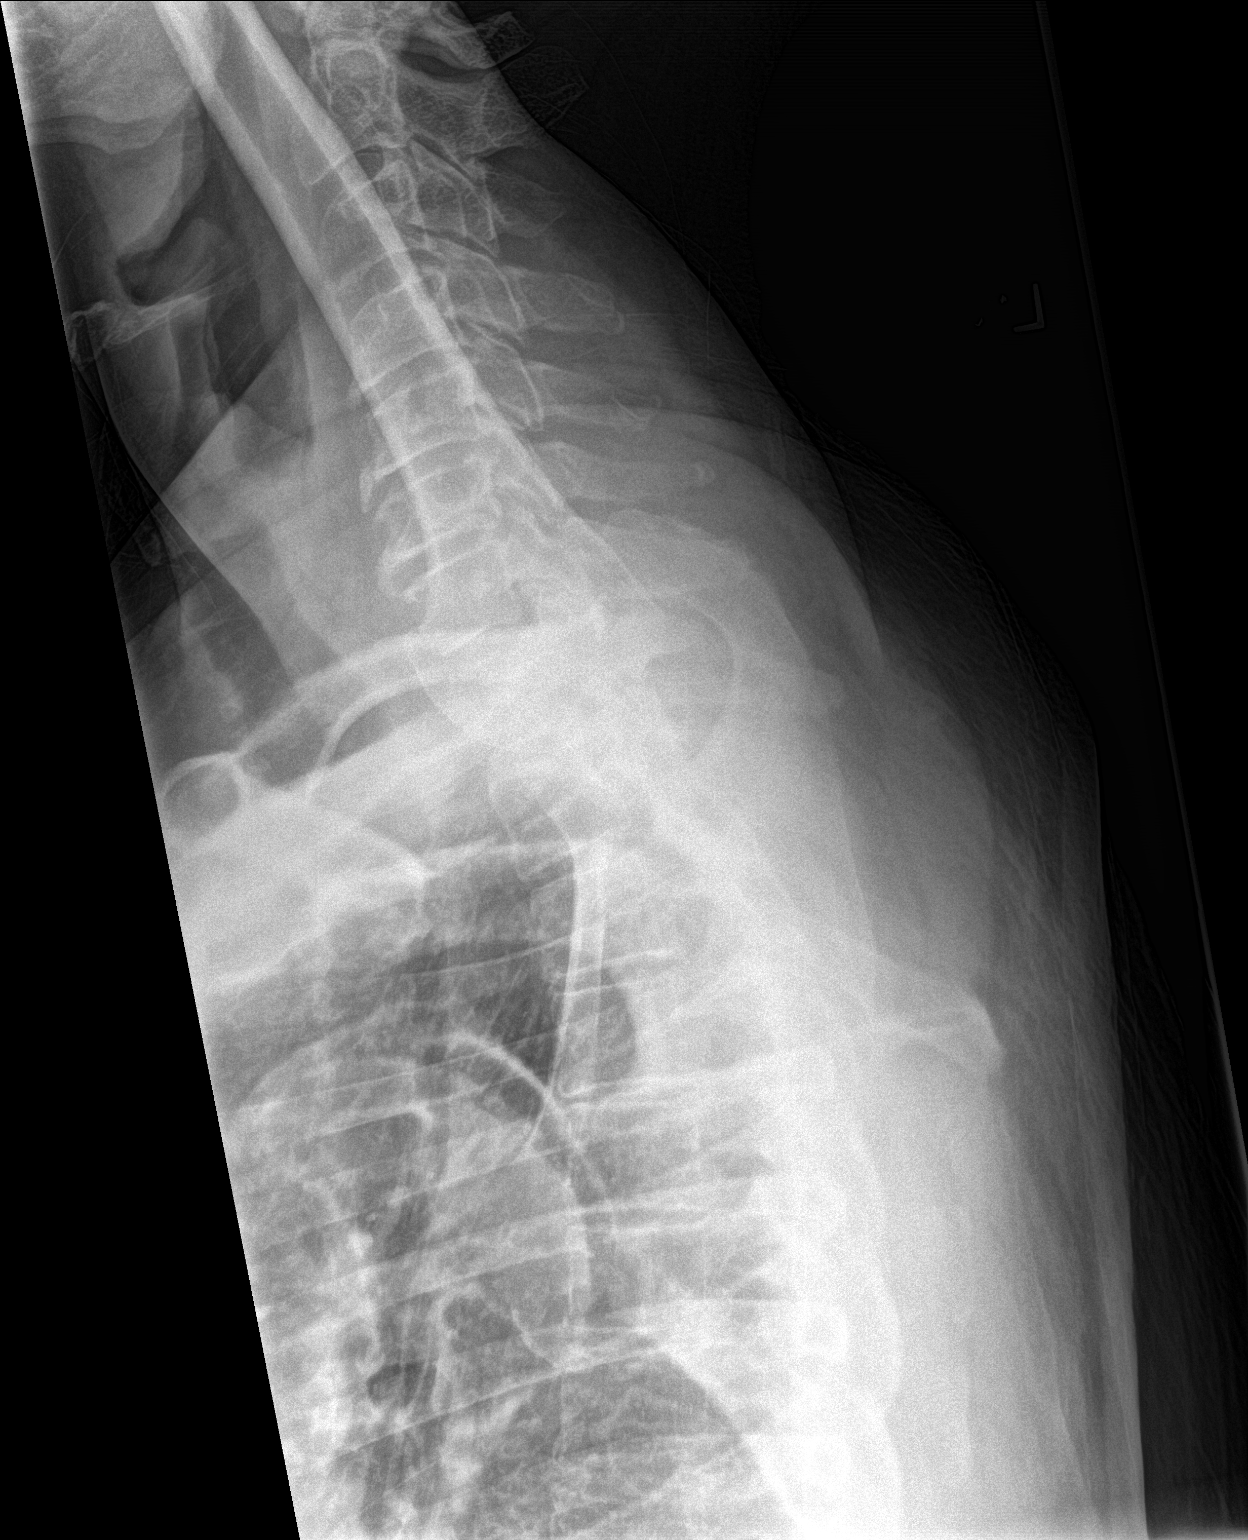

[3 of 3 positions shown; findings below may reference images not displayed]

FINDINGS: There is no evidence of thoracic spine fracture. Alignment is
normal. No other significant bone abnormalities are identified.
IMPRESSION: Negative.

## 2022-05-25 IMAGING — DX DG CERVICAL SPINE COMPLETE 4+V
7 series · 7 of 7 positions shown · non-contrast
Comparison: None.

CLINICAL DATA: Neck pain for many years

EXAM:
CERVICAL SPINE - COMPLETE 4+ VIEW

[c-spine lat]
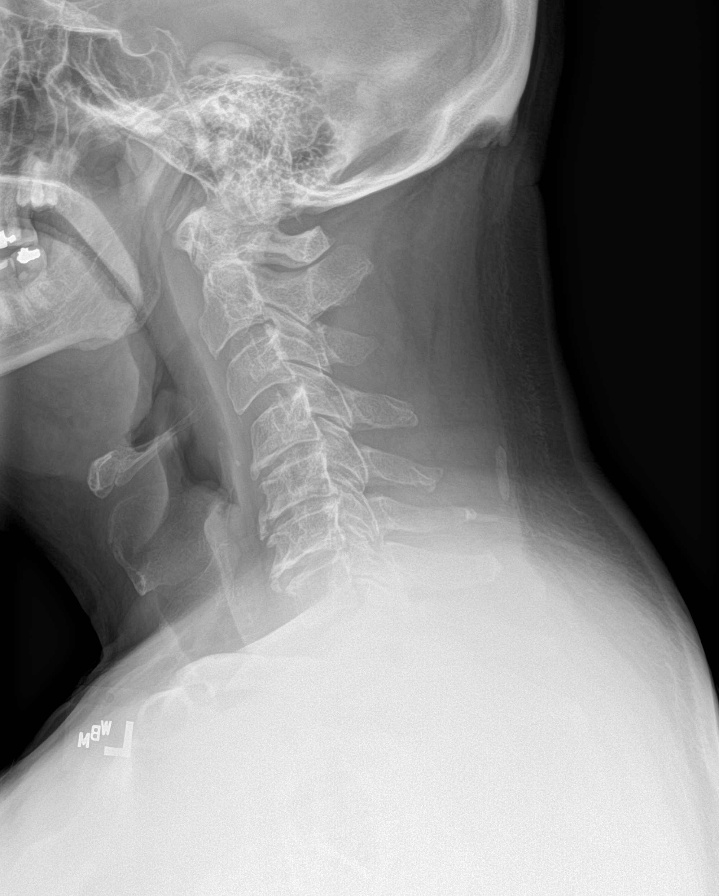

[c-spine obl (1 of 2)]
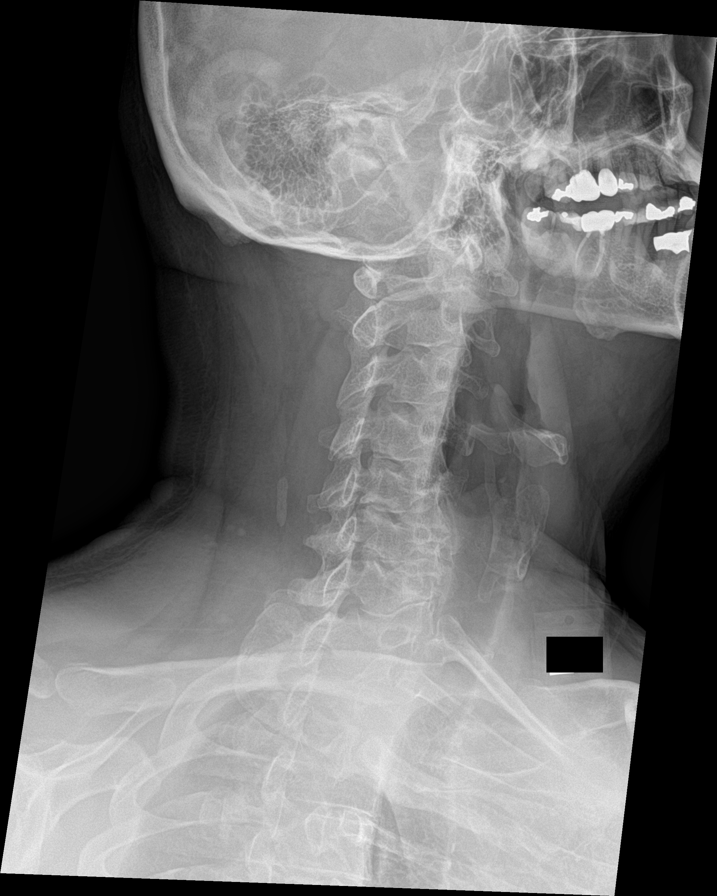

[c-spine obl (2 of 2)]
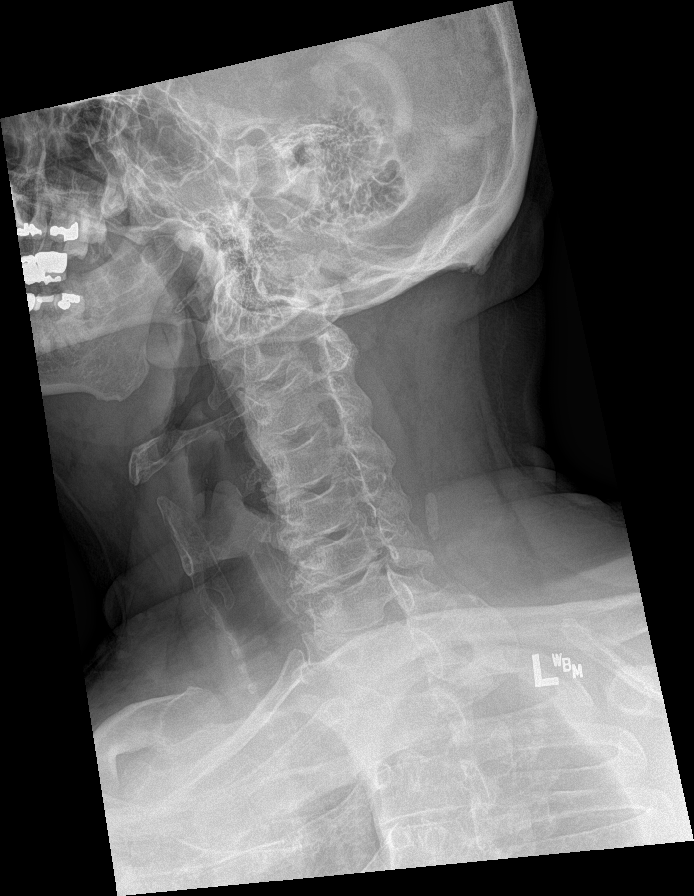

[c-spine ap]
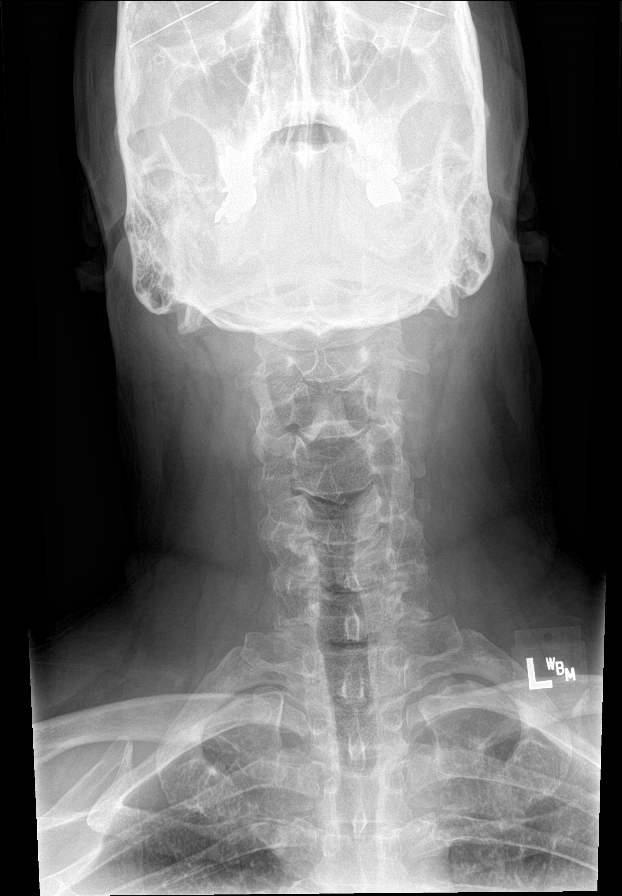

[c-spine open mouth]
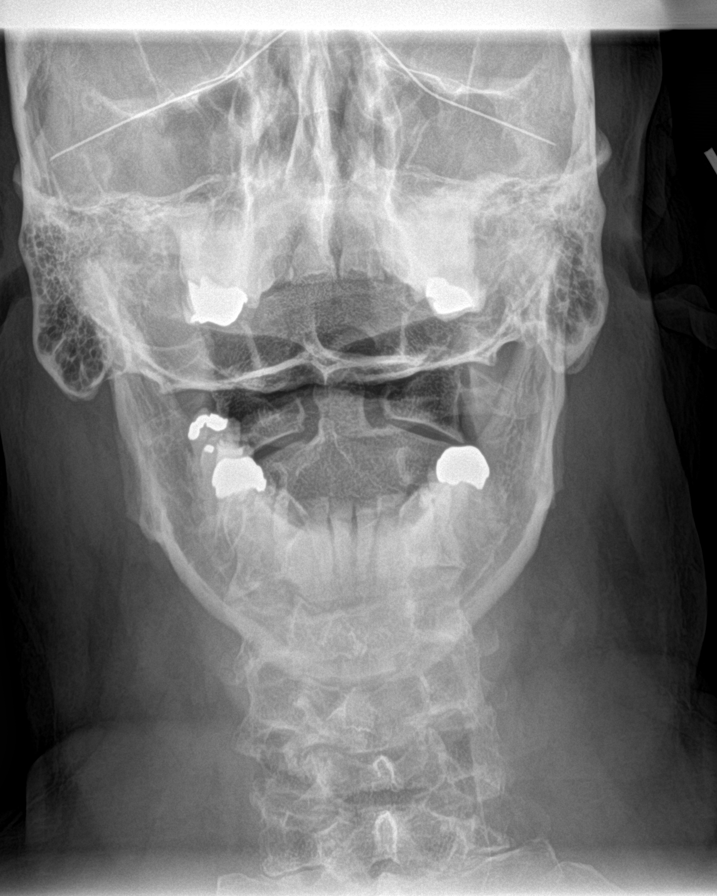

[[person_name]]
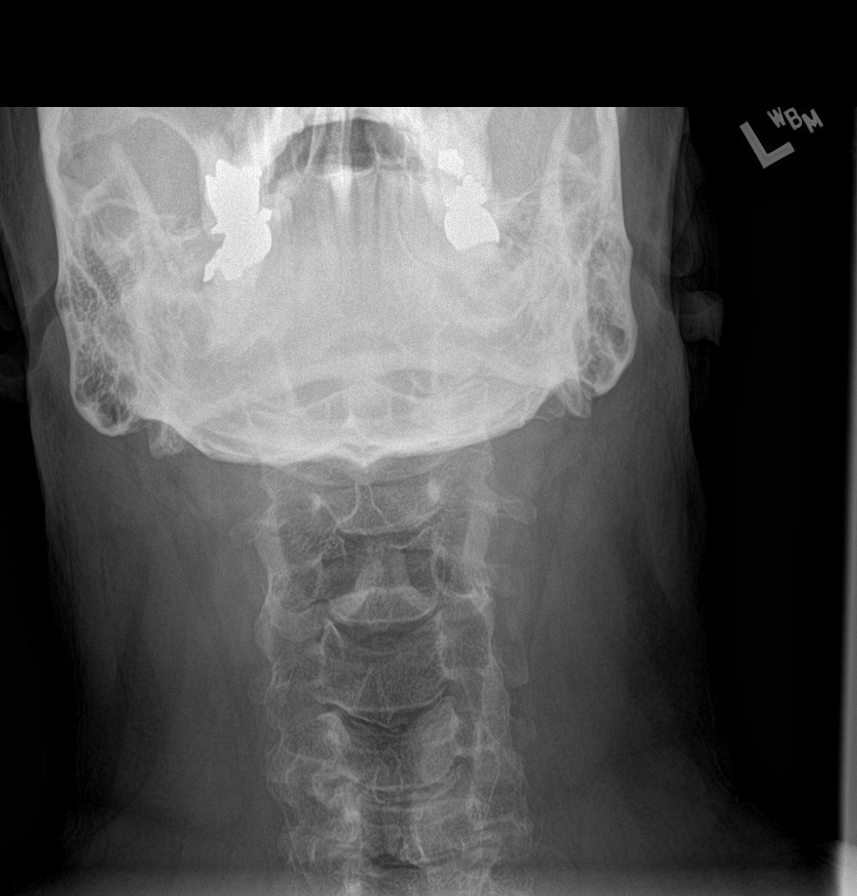

[c-spine swimmers]
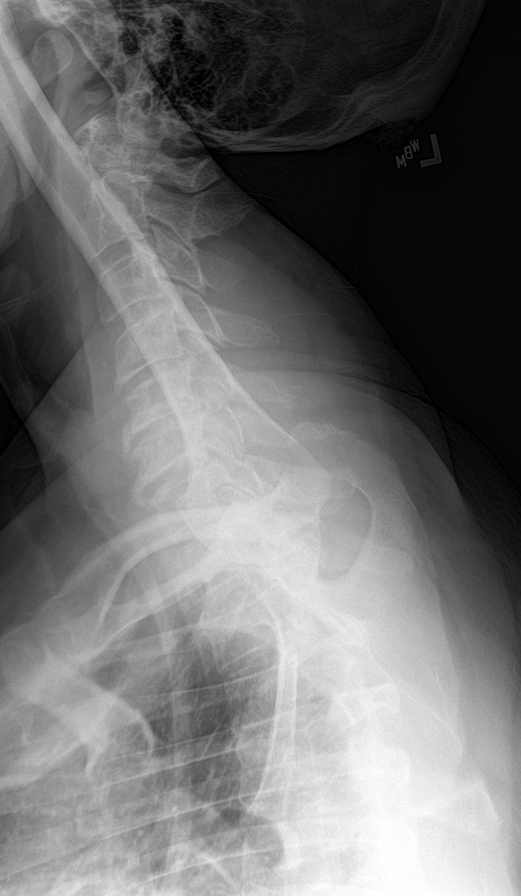

[7 of 7 positions shown; findings below may reference images not displayed]

FINDINGS: Mild reversal of cervical lordosis. Vertebral body heights are
maintained. Dens and lateral masses are within normal limits.
Moderate degenerative changes C4-C5, C5-C6 and C6-C7. Moderate
diffuse bilateral foraminal narrowing of the mid to lower cervical
spine.
IMPRESSION: Multilevel degenerative changes with moderate disease C4 through C7.
Reversal of cervical lordosis.

## 2022-06-09 ENCOUNTER — Encounter: Payer: Self-pay | Admitting: Family Medicine

## 2022-06-09 ENCOUNTER — Telehealth (INDEPENDENT_AMBULATORY_CARE_PROVIDER_SITE_OTHER): Payer: BC Managed Care – PPO | Admitting: Family Medicine

## 2022-06-09 DIAGNOSIS — M25512 Pain in left shoulder: Secondary | ICD-10-CM

## 2022-06-09 DIAGNOSIS — G8929 Other chronic pain: Secondary | ICD-10-CM

## 2022-06-09 DIAGNOSIS — F321 Major depressive disorder, single episode, moderate: Secondary | ICD-10-CM

## 2022-06-09 NOTE — Progress Notes (Signed)
There were no vitals taken for this visit.   Subjective:    Patient ID: Curtis Ran., male    DOB: 1960-07-11, 62 y.o.   MRN: [id number]  HPI: Curtis Duggar. is a 62 y.o. male  Chief Complaint  Patient presents with   Depression   DEPRESSION Mood status: better Satisfied with current treatment?: yes Symptom severity: mild  Duration of current treatment : chronic Side effects: no Medication compliance: excellent compliance Psychotherapy/counseling: no  Previous psychiatric medications: wellbutrin, cymbalta Depressed mood: no Anxious mood: no Anhedonia: no Significant weight loss or gain: no Insomnia: no  Fatigue: yes Feelings of worthlessness or guilt: no Impaired concentration/indecisiveness: no Suicidal ideations: no Hopelessness: no Crying spells: no    06/09/2022    3:54 PM 05/12/2022    4:04 PM 03/10/2022    2:09 PM 10/10/2021    4:18 PM 09/06/2021    2:43 PM  Depression screen PHQ 2/9  Decreased Interest 0 '1 1 1 '$ 0  Down, Depressed, Hopeless 0 0 0 0 0  PHQ - 2 Score 0 '1 1 1 '$ 0  Altered sleeping 1 0 0 0 0  Tired, decreased energy '1 2 2 3 2  '$ Change in appetite 0 1 1 0 2  Feeling bad or failure about yourself  0 0 0 0 0  Trouble concentrating '1 2 1 1 3  '$ Moving slowly or fidgety/restless '1 1 2 1 '$ 0  Suicidal thoughts 0 0 0 0 0  PHQ-9 Score '4 7 7 6 7  '$ Difficult doing work/chores Not difficult at all Somewhat difficult Somewhat difficult     SHOULDER PAIN Duration: a couple of months Involved shoulder: left Mechanism of injury: unknown Location:  deep into his shoulder Onset:gradual Severity: severe  Quality:  aching and sore Frequency:  constant but waxes and wanes Radiation: into his elbow Aggravating factors: lifting, movement, and sleep  Alleviating factors: nothing  Status: worse Treatments attempted: putting a pillow under it  Relief with NSAIDs?:  mild Weakness: no Numbness: no Decreased grip strength: no Redness: no Swelling:  no Bruising: no Fevers: no   Relevant past medical, surgical, family and social history reviewed and updated as indicated. Interim medical history since our last visit reviewed. Allergies and medications reviewed and updated.  Review of Systems  Constitutional: Negative.   Respiratory: Negative.    Cardiovascular: Negative.   Gastrointestinal: Negative.   Musculoskeletal:  Positive for arthralgias. Negative for back pain, gait problem, joint swelling, myalgias, neck pain and neck stiffness.  Skin: Negative.   Neurological: Negative.   Psychiatric/Behavioral: Negative.      Per HPI unless specifically indicated above     Objective:    There were no vitals taken for this visit.  Wt Readings from Last 3 Encounters:  05/12/22 255 lb 14.4 oz (116.1 kg)  03/10/22 283 lb 4.8 oz (128.5 kg)  01/24/22 282 lb (127.9 kg)    Physical Exam Vitals and nursing note reviewed.  Constitutional:      General: He is not in acute distress.    Appearance: Normal appearance. He is obese. He is not ill-appearing, toxic-appearing or diaphoretic.  HENT:     Head: Normocephalic and atraumatic.     Right Ear: External ear normal.     Left Ear: External ear normal.     Nose: Nose normal.     Mouth/Throat:     Mouth: Mucous membranes are moist.     Pharynx: Oropharynx is clear.  Eyes:  General: No scleral icterus.       Right eye: No discharge.        Left eye: No discharge.     Conjunctiva/sclera: Conjunctivae normal.     Pupils: Pupils are equal, round, and reactive to light.  Pulmonary:     Effort: Pulmonary effort is normal. No respiratory distress.     Comments: Speaking in full sentences Musculoskeletal:        General: Normal range of motion.     Cervical back: Normal range of motion.  Skin:    Coloration: Skin is not jaundiced or pale.     Findings: No bruising, erythema, lesion or rash.  Neurological:     Mental Status: He is alert and oriented to person, place, and time.  Mental status is at baseline.  Psychiatric:        Mood and Affect: Mood normal.        Behavior: Behavior normal.        Thought Content: Thought content normal.        Judgment: Judgment normal.     Results for orders placed or performed in visit on 01/24/22  Sjogrens syndrome-A extractable nuclear antibody  Result Value Ref Range   SSA (Ro) (ENA) Antibody, IgG <1.0 NEG <1.0 NEG AI  Sjogrens syndrome-B extractable nuclear antibody  Result Value Ref Range   SSB (La) (ENA) Antibody, IgG 1.2 POS (A) <1.0 NEG AI  C3 and C4  Result Value Ref Range   C3 Complement 148 82 - 185 mg/dL   C4 Complement 23 15 - 53 mg/dL  Rheumatoid factor  Result Value Ref Range   Rhuematoid fact SerPl-aCnc <14 <14 IU/mL  Sedimentation rate  Result Value Ref Range   Sed Rate 2 0 - 20 mm/h  C-reactive protein  Result Value Ref Range   CRP 1.1 <8.0 mg/L      Assessment & Plan:   Problem List Items Addressed This Visit       Other   Depression, major, single episode, moderate (HCC)    Under good control on current regimen. Continue current regimen. Continue to monitor. Call with any concerns. Refills up to date.        Other Visit Diagnoses     Chronic left shoulder pain    -  Primary   Will check x-rays, continue meloxicam and start stretches. If not getting better or getting worse will get him into PT. Continue to monitor.   Relevant Orders   DG Shoulder Left        Follow up plan: Return April physical.   This visit was completed via video visit through [facility] due to the restrictions of the COVID-19 pandemic. All issues as above were discussed and addressed. Physical exam was done as above through visual confirmation on video through MyChart. If it was felt that the patient should be evaluated in the office, they were directed there. The patient verbally consented to this visit. Location of the patient: home Location of the provider: work Those involved with this call:  Provider:  Park Liter, DO CMA: Irena Reichmann, [place] Desk/Registration: [employer]  Time spent on call:  25 minutes with patient face to face via video conference. More than 50% of this time was spent in counseling and coordination of care. 40 minutes total spent in review of patient's record and preparation of their chart.

## 2022-06-09 NOTE — Assessment & Plan Note (Signed)
Under good control on current regimen. Continue current regimen. Continue to monitor. Call with any concerns. Refills up to date.   

## 2022-06-30 ENCOUNTER — Encounter: Payer: Self-pay | Admitting: Family Medicine

## 2022-07-21 ENCOUNTER — Ambulatory Visit: Payer: BC Managed Care – PPO | Admitting: Podiatry

## 2022-07-21 ENCOUNTER — Ambulatory Visit (INDEPENDENT_AMBULATORY_CARE_PROVIDER_SITE_OTHER): Payer: BC Managed Care – PPO

## 2022-07-21 ENCOUNTER — Encounter: Payer: Self-pay | Admitting: Podiatry

## 2022-07-21 ENCOUNTER — Other Ambulatory Visit: Payer: Self-pay | Admitting: Podiatry

## 2022-07-21 DIAGNOSIS — S92354A Nondisplaced fracture of fifth metatarsal bone, right foot, initial encounter for closed fracture: Secondary | ICD-10-CM

## 2022-07-21 DIAGNOSIS — S9001XA Contusion of right ankle, initial encounter: Secondary | ICD-10-CM

## 2022-07-27 NOTE — Progress Notes (Signed)
Chief Complaint  Patient presents with   Foot Injury    Lateral foot right - rolled foot yesterday, swollen and bruised, can't bear full weight    HPI: 62 y.o. male presenting today for new complaint of pain and tenderness associated to the lateral aspect of the right foot.  Patient states that he was walking and his foot rolled off of the edge of the concrete sidewalk.  He noticed significant pain and tenderness.  DOI: 07/20/2022.  Unable to bear weight without pain.  Presenting for further treatment and evaluation  Past Medical History:  Diagnosis Date   Allergy    Cancer ([place])    Prostate   Carpal tunnel syndrome    DDD (degenerative disc disease), cervical    DDD (degenerative disc disease), cervical    Deafness in left ear    Dyspnea    with exertion   Edema    GERD (gastroesophageal reflux disease)    History of kidney stones    passed   Hyperlipidemia    Personal history of colonic polyps    Pinched nerve    upper and lower back   Prostate cancer ([place])    Sleep apnea    Bi-pap   Sleep apnea    Stroke ([place])    2013, Blind eye   TIA (transient ischemic attack) [id number]   Umbilical hernia    Vision impairment    Blindness Right Eye    Past Surgical History:  Procedure Laterality Date   ANTERIOR CERVICAL DECOMP/DISCECTOMY FUSION N/A 03/30/2020   Procedure: Cervical Four-Five/Cervical Five-Six / Cervical Six-Seven  Anterior cervical decompression/discectomy/fusion;  Surgeon: Erline Levine, MD;  Location: [place];  Service: Neurosurgery;  Laterality: N/A;  3C/RM 21   APPENDECTOMY     carpel tunnel Bilateral    COLONOSCOPY  2015   Found 2 beign polyps, repeat 10 years   COLONOSCOPY WITH PROPOFOL N/A 03/30/2019   Procedure: COLONOSCOPY WITH PROPOFOL;  Surgeon: [name], MD;  Location: [facility];  Service: Gastroenterology;  Laterality: N/A;   ESOPHAGOGASTRODUODENOSCOPY (EGD) WITH PROPOFOL N/A 03/30/2019   Procedure: ESOPHAGOGASTRODUODENOSCOPY (EGD) WITH PROPOFOL;   Surgeon: [name], MD;  Location: [facility] END[facility]COPY;  Service: Gastroenterology;  Laterality: N/A;   POSTERIOR CERVICAL LAMINECTOMY WITH MET- RX Right 05/11/2020   Procedure: RIGHT CERVICAL FOUR-FIVE, CERVICAL FIVE-SIX FORAMINOTOMY WITH METRX;  Surgeon: Erline Levine, MD;  Location: [place];  Service: Neurosurgery;  Laterality: Right;  3C/RM 21   RADIOACTIVE SEED IMPLANT N/A 12/13/2018   Procedure: RADIOACTIVE SEED IMPLANT/BRACHYTHERAPY IMPLANT;  Surgeon: Billey Co, MD;  Location: [facility];  Service: Urology;  Laterality: N/A;   TONSILLECTOMY      No Known Allergies   Physical Exam: General: The patient is alert and oriented x3 in no acute distress.  Dermatology: Skin is warm, dry and supple bilateral lower extremities. Negative for open lesions or macerations.  Vascular: Palpable pedal pulses bilaterally. Capillary refill within normal limits.  Negative for any significant edema or erythema  Neurological: Light touch and protective threshold grossly intact  Musculoskeletal Exam: No pedal deformities noted  Radiographic Exam RT foot 07/21/2022:  Normal osseous mineralization. Joint spaces preserved.  Acute extra-articular Jones fracture, minimally displaced, noted along the metaphyseal diaphyseal junction of the fifth metatarsal right foot.  Assessment: 1.  Jones fracture fifth metatarsal right foot   Plan of Care:  1. Patient evaluated. X-Rays reviewed.  2.  Today we discussed treatment options both conservative and surgical.  After discussing with the patient both  options we will plan to pursue conservative treatment and management for now. 3.  Cam boot dispensed.  Nonweightbearing right lower extremity 4.  Recommend RICE over the next few weeks 5.  Ace wrap applied.  Recommend compression daily  6.  Return to clinic 4 weeks follow-up x-ray  *Teacher/professor for architecture, drafting, and design       Edrick Kins, DPM [facility]  Dr. Edrick Kins, DPM    [address], [place] [address]                Office [phone]  Fax [phone]

## 2022-08-18 ENCOUNTER — Ambulatory Visit (INDEPENDENT_AMBULATORY_CARE_PROVIDER_SITE_OTHER): Payer: BC Managed Care – PPO

## 2022-08-18 ENCOUNTER — Ambulatory Visit: Payer: BC Managed Care – PPO | Admitting: Podiatry

## 2022-08-18 DIAGNOSIS — S92354D Nondisplaced fracture of fifth metatarsal bone, right foot, subsequent encounter for fracture with routine healing: Secondary | ICD-10-CM

## 2022-08-18 NOTE — Progress Notes (Signed)
Chief Complaint  Patient presents with   Fracture    Patient came in today for a right foot fracture follow-up, patient rates his pain 1 out of 68, TX: cam boot, X0-rays done today     HPI: 62 y.o. male presenting today for follow-up evaluation of a fracture to the fifth metatarsal of the right foot.  Patient doing well.  He has mostly been NWB in the cam boot as instructed with the assistance of crutches and a knee scooter.  No new complaints at this time  Brief history: Patient states that he was walking and his foot rolled off of the edge of the concrete sidewalk.  He noticed significant pain and tenderness.  DOI: 07/20/2022.    Past Medical History:  Diagnosis Date   Allergy    Cancer [facility])    Prostate   Carpal tunnel syndrome    DDD (degenerative disc disease), cervical    DDD (degenerative disc disease), cervical    Deafness in left ear    Dyspnea    with exertion   Edema    GERD (gastroesophageal reflux disease)    History of kidney stones    passed   Hyperlipidemia    Personal history of colonic polyps    Pinched nerve    upper and lower back   Prostate cancer ([place])    Sleep apnea    Bi-pap   Sleep apnea    Stroke ([place])    2013, Blind eye   TIA (transient ischemic attack) [id number]   Umbilical hernia    Vision impairment    Blindness Right Eye    Past Surgical History:  Procedure Laterality Date   ANTERIOR CERVICAL DECOMP/DISCECTOMY FUSION N/A 03/30/2020   Procedure: Cervical Four-Five/Cervical Five-Six / Cervical Six-Seven  Anterior cervical decompression/discectomy/fusion;  Surgeon: Erline Levine, MD;  Location: [place];  Service: Neurosurgery;  Laterality: N/A;  3C/RM 21   APPENDECTOMY     carpel tunnel Bilateral    COLONOSCOPY  2015   Found 2 beign polyps, repeat 10 years   COLONOSCOPY WITH PROPOFOL N/A 03/30/2019   Procedure: COLONOSCOPY WITH PROPOFOL;  Surgeon: [name], MD;  Location: [facility]COPY;  Service: Gastroenterology;  Laterality: N/A;    ESOPHAGOGASTRODUODENOSCOPY (EGD) WITH PROPOFOL N/A 03/30/2019   Procedure: ESOPHAGOGASTRODUODENOSCOPY (EGD) WITH PROPOFOL;  Surgeon: [name], MD;  Location: [facility] ENDOSCOPY;  Service: Gastroenterology;  Laterality: N/A;   POSTERIOR CERVICAL LAMINECTOMY WITH MET- RX Right 05/11/2020   Procedure: RIGHT CERVICAL FOUR-FIVE, CERVICAL FIVE-SIX FORAMINOTOMY WITH METRX;  Surgeon: Erline Levine, MD;  Location: [place];  Service: Neurosurgery;  Laterality: Right;  3C/RM 21   RADIOACTIVE SEED IMPLANT N/A 12/13/2018   Procedure: RADIOACTIVE SEED IMPLANT/BRACHYTHERAPY IMPLANT;  Surgeon: Billey Co, MD;  Location: [facility] ORS;  Service: Urology;  Laterality: N/A;   TONSILLECTOMY      No Known Allergies   Physical Exam: General: The patient is alert and oriented x3 in no acute distress.  Dermatology: Skin is warm, dry and supple bilateral lower extremities. Negative for open lesions or macerations.  Vascular: Palpable pedal pulses bilaterally. Capillary refill within normal limits.  Negative for any significant edema or erythema  Neurological: Light touch and protective threshold grossly intact  Musculoskeletal Exam: No pedal deformities noted  Radiographic Exam RT foot 07/21/2022:  Normal osseous mineralization. Joint spaces preserved.  Acute extra-articular Jones fracture, minimally displaced, noted along the metaphyseal diaphyseal junction of the fifth metatarsal right foot.  Radiographic exam RT foot 08/18/2022: Slight bone resorption noted  along the fracture site expected with initial osseous healing.  Continues to be nondisplaced in decent alignment.  Assessment: 1.  Jones fracture fifth metatarsal right foot   Plan of Care:  1. Patient evaluated. X-Rays reviewed.  2.  Continue strict NWB cam boot. 3.  Compression sleeve dispensed.  Wear daily with a cam boot 4.  Return to clinic 6 weeks follow-up x-ray  *Teacher/professor for architecture, drafting, and design       Edrick Kins,  DPM [facility]  Dr. Edrick Kins, DPM    [address], [place] [address]                Office [phone]  Fax [phone]

## 2022-09-08 ENCOUNTER — Ambulatory Visit (INDEPENDENT_AMBULATORY_CARE_PROVIDER_SITE_OTHER): Payer: BC Managed Care – PPO | Admitting: Family Medicine

## 2022-09-08 ENCOUNTER — Encounter: Payer: Self-pay | Admitting: Family Medicine

## 2022-09-08 VITALS — BP 132/72 | HR 81 | Temp 98.7°F | Ht 71.0 in | Wt 288.6 lb

## 2022-09-08 DIAGNOSIS — Z0000 Encounter for general adult medical examination without abnormal findings: Secondary | ICD-10-CM | POA: Diagnosis not present

## 2022-09-08 DIAGNOSIS — F321 Major depressive disorder, single episode, moderate: Secondary | ICD-10-CM

## 2022-09-08 DIAGNOSIS — E782 Mixed hyperlipidemia: Secondary | ICD-10-CM

## 2022-09-08 DIAGNOSIS — L247 Irritant contact dermatitis due to plants, except food: Secondary | ICD-10-CM

## 2022-09-08 DIAGNOSIS — C61 Malignant neoplasm of prostate: Secondary | ICD-10-CM | POA: Diagnosis not present

## 2022-09-08 DIAGNOSIS — K219 Gastro-esophageal reflux disease without esophagitis: Secondary | ICD-10-CM

## 2022-09-08 DIAGNOSIS — R413 Other amnesia: Secondary | ICD-10-CM

## 2022-09-08 LAB — URINALYSIS, ROUTINE W REFLEX MICROSCOPIC
Bilirubin, UA: NEGATIVE
Glucose, UA: NEGATIVE
Ketones, UA: NEGATIVE
Leukocytes,UA: NEGATIVE
Nitrite, UA: NEGATIVE
Protein,UA: NEGATIVE
RBC, UA: NEGATIVE
Specific Gravity, UA: 1.025 (ref 1.005–1.030)
Urobilinogen, Ur: 0.2 mg/dL (ref 0.2–1.0)
pH, UA: 6 (ref 5.0–7.5)

## 2022-09-08 MED ORDER — GABAPENTIN 300 MG PO CAPS: ORAL_CAPSULE | ORAL | 4 refills | Status: DC

## 2022-09-08 MED ORDER — BUPROPION HCL ER (XL) 300 MG PO TB24: 300.0000 mg | ORAL_TABLET | Freq: Every day | ORAL | 1 refills | Status: DC

## 2022-09-08 MED ORDER — SIMVASTATIN 40 MG PO TABS: 40.0000 mg | ORAL_TABLET | Freq: Every day | ORAL | 1 refills | Status: DC

## 2022-09-08 MED ORDER — DULOXETINE HCL 20 MG PO CPEP: 40.0000 mg | ORAL_CAPSULE | Freq: Every day | ORAL | 1 refills | Status: DC

## 2022-09-08 MED ORDER — RABEPRAZOLE SODIUM 20 MG PO TBEC: 40.0000 mg | DELAYED_RELEASE_TABLET | Freq: Every day | ORAL | 1 refills | Status: DC

## 2022-09-08 MED ORDER — TRIAMCINOLONE ACETONIDE 0.5 % EX OINT: 1.0000 | TOPICAL_OINTMENT | Freq: Two times a day (BID) | CUTANEOUS | 0 refills | Status: DC

## 2022-09-08 MED ORDER — MELOXICAM 15 MG PO TABS: 15.0000 mg | ORAL_TABLET | Freq: Every day | ORAL | 1 refills | Status: DC

## 2022-09-08 MED ORDER — ASPIRIN 81 MG PO TBEC: 81.0000 mg | DELAYED_RELEASE_TABLET | Freq: Every day | ORAL | 3 refills | Status: DC

## 2022-09-08 NOTE — Assessment & Plan Note (Signed)
Under good control on current regimen. Continue current regimen. Continue to monitor. Call with any concerns. Refills given. Labs drawn today.   

## 2022-09-08 NOTE — Progress Notes (Signed)
BP 132/72   Pulse 81   Temp 98.7 F (37.1 C) (Oral)   Ht  (1.803 m)   Wt 288 lb 9.6 oz (130.9 kg)   SpO2 96%   BMI 40.25 kg/m    Subjective:    Patient ID: Curtis Sheng., male    DOB: 03-Sep-1960, 62 y.o.   MRN: [id number]  HPI: Curtis Bonsall. is a 62 y.o. male presenting on 09/08/2022 for comprehensive medical examination. Current medical complaints include:  HYPERLIPIDEMIA Hyperlipidemia status: controlled Satisfied with current treatment?  yes Side effects:  no Medication compliance: excellent compliance Past cholesterol meds: simvastatin Supplements: none Aspirin:  yes The ASCVD Risk score (Arnett DK, et al., 2019) failed to calculate for the following reasons:   The patient has a prior MI or stroke diagnosis Chest pain:  no  DEPRESSION Mood status: stable Satisfied with current treatment?: yes Symptom severity: moderate  Duration of current treatment : chronic Side effects: no Medication compliance: excellent compliance Psychotherapy/counseling: no  Previous psychiatric medications: cymbalta, wellbutrin Depressed mood: yes Anxious mood: yes Anhedonia: no Significant weight loss or gain: no Insomnia: yes  Fatigue: yes Feelings of worthlessness or guilt: no Impaired concentration/indecisiveness: no Suicidal ideations: no Hopelessness: no Crying spells: no    09/08/2022    3:16 PM 06/09/2022    3:54 PM 05/12/2022    4:04 PM 03/10/2022    2:09 PM 10/10/2021    4:18 PM  Depression screen PHQ 2/9  Decreased Interest 1 0 Down, Depressed, Hopeless 1 0 0 0 0  PHQ - 2 Score 2 0 Altered sleeping 0 1 0 0 0  Tired, decreased energy Change in appetite 2 0 1 1 0  Feeling bad or failure about yourself  0 0 0 0 0  Trouble concentrating Moving slowly or fidgety/restless Suicidal thoughts 0 0 0 0 0  PHQ-9 Score Difficult doing work/chores  Not difficult at all Somewhat difficult Somewhat  difficult    He currently lives with: wife Interim Problems from his last visit: broke his R foot  Depression Screen done today and results listed below:     09/08/2022    3:16 PM 06/09/2022    3:54 PM 05/12/2022    4:04 PM 03/10/2022    2:09 PM 10/10/2021    4:18 PM  Depression screen PHQ 2/9  Decreased Interest 1 0 Down, Depressed, Hopeless 1 0 0 0 0  PHQ - 2 Score 2 0 Altered sleeping 0 1 0 0 0  Tired, decreased energy Change in appetite 2 0 1 1 0  Feeling bad or failure about yourself  0 0 0 0 0  Trouble concentrating Moving slowly or fidgety/restless Suicidal thoughts 0 0 0 0 0  PHQ-9 Score Difficult doing work/chores  Not difficult at all Somewhat difficult Somewhat difficult     Past Medical History:  Past Medical History:  Diagnosis Date   Allergy    Cancer    Prostate   Carpal tunnel syndrome    DDD (degenerative disc disease), cervical    DDD (degenerative disc disease), cervical    Deafness in left  ear    Dyspnea    with exertion   Edema    GERD (gastroesophageal reflux disease)    History of kidney stones    passed   Hyperlipidemia    Personal history of colonic polyps    Pinched nerve    upper and lower back   Prostate cancer    Sleep apnea    Bi-pap   Sleep apnea    Stroke    2013, Blind eye   TIA (transient ischemic attack) 07/2011   Umbilical hernia    Vision impairment    Blindness Right Eye    Surgical History:  Past Surgical History:  Procedure Laterality Date   ANTERIOR CERVICAL DECOMP/DISCECTOMY FUSION N/A 03/30/2020   Procedure: Cervical Four-Five/Cervical Five-Six / Cervical Six-Seven  Anterior cervical decompression/discectomy/fusion;  Surgeon: Maeola Harman, MD;  Location: [facility] OR;  Service: Neurosurgery;  Laterality: N/A;  3C/RM 21   APPENDECTOMY     carpel tunnel Bilateral    COLONOSCOPY  2015   Found 2 beign polyps, repeat 10 years   COLONOSCOPY WITH PROPOFOL N/A  03/30/2019   Procedure: COLONOSCOPY WITH PROPOFOL;  Surgeon: Wyline Mood, MD;  Location: [facility] ENDOSCOPY;  Service: Gastroenterology;  Laterality: N/A;   ESOPHAGOGASTRODUODENOSCOPY (EGD) WITH PROPOFOL N/A 03/30/2019   Procedure: ESOPHAGOGASTRODUODENOSCOPY (EGD) WITH PROPOFOL;  Surgeon: Wyline Mood, MD;  Location: [facility] ENDOSCOPY;  Service: Gastroenterology;  Laterality: N/A;   POSTERIOR CERVICAL LAMINECTOMY WITH MET- RX Right 05/11/2020   Procedure: RIGHT CERVICAL FOUR-FIVE, CERVICAL FIVE-SIX FORAMINOTOMY WITH METRX;  Surgeon: Maeola Harman, MD;  Location: [facility] OR;  Service: Neurosurgery;  Laterality: Right;  3C/RM 21   RADIOACTIVE SEED IMPLANT N/A 12/13/2018   Procedure: RADIOACTIVE SEED IMPLANT/BRACHYTHERAPY IMPLANT;  Surgeon: Sondra Come, MD;  Location: [facility] ORS;  Service: Urology;  Laterality: N/A;   TONSILLECTOMY      Medications:  Current Outpatient Medications on File Prior to Visit  Medication Sig   Coenzyme Q10 400 MG CAPS Take 1 capsule (400 mg total) by mouth daily.   Fluocinolone Acetonide 0.01 % OIL Place in ear(s).   fluticasone (FLONASE) 50 MCG/ACT nasal spray PLACE 2 SPRAYS INTO BOTH NOSTRILS ONCE DAILY   Multiple Vitamins-Minerals (ZINC PO) Take by mouth daily.   tadalafil (CIALIS) 10 MG tablet Take 1 tablet (10 mg total) by mouth daily as needed for erectile dysfunction.   tamsulosin (FLOMAX) 0.4 MG CAPS capsule Take 2 capsules (0.8 mg total) by mouth daily. For prostate/urine   traZODone (DESYREL) 50 MG tablet Take 2 tablets (100 mg total) by mouth at bedtime. For sleep   No current facility-administered medications on file prior to visit.    Allergies:  No Known Allergies  Social History:  Social History   Socioeconomic History   Marital status: Married    Spouse name: Not on file   Number of children: Not on file   Years of education: Not on file   Highest education level: Not on file  Occupational History   Not on file  Tobacco Use   Smoking status: Former     Years: 18    Types: Cigarettes    Quit date: 05/27/1995    Years since quitting: 27.3    Passive exposure: Past   Smokeless tobacco: Never  Vaping Use   Vaping Use: Never used  Substance and Sexual Activity   Alcohol use: Yes    Alcohol/week: 2.0 standard drinks of alcohol    Types: 2 Glasses of wine per week    Comment: occ  Drug use: No   Sexual activity: Yes  Other Topics Concern   Not on file  Social History Narrative   Not on file   Social Determinants of Health   Financial Resource Strain: Not on file  Food Insecurity: Not on file  Transportation Needs: Not on file  Physical Activity: Not on file  Stress: Not on file  Social Connections: Not on file  Intimate Partner Violence: Not on file   Social History   Tobacco Use  Smoking Status Former   Years: 18   Types: Cigarettes   Quit date: 05/27/1995   Years since quitting: 27.3   Passive exposure: Past  Smokeless Tobacco Never   Social History   Substance and Sexual Activity  Alcohol Use Yes   Alcohol/week: 2.0 standard drinks of alcohol   Types: 2 Glasses of wine per week   Comment: occ    Family History:  Family History  Problem Relation Age of Onset   Hearing loss Father    Hypertension Father    Pneumonia Father    Cancer Maternal Grandmother        Colon   Asthma Son     Past medical history, surgical history, medications, allergies, family history and social history reviewed with patient today and changes made to appropriate areas of the chart.   Review of Systems  Constitutional: Negative.   HENT: Negative.    Eyes:  Positive for blurred vision. Negative for double vision, photophobia, pain, discharge and redness.  Respiratory:  Positive for shortness of breath. Negative for cough, hemoptysis, sputum production and wheezing.   Cardiovascular:  Positive for leg swelling. Negative for chest pain, palpitations, orthopnea, claudication and PND.  Gastrointestinal: Negative.   Genitourinary:  Negative.   Musculoskeletal: Negative.   Skin:  Positive for rash. Negative for itching.  Neurological:  Positive for tingling. Negative for dizziness, tremors, sensory change, speech change, focal weakness, seizures, loss of consciousness, weakness and headaches.  Endo/Heme/Allergies:  Positive for environmental allergies. Negative for polydipsia. Does not bruise/bleed easily.  Psychiatric/Behavioral:  Positive for depression and memory loss. Negative for hallucinations, substance abuse and suicidal ideas. The patient is not nervous/anxious and does not have insomnia.    All other ROS negative except what is listed above and in the HPI.      Objective:    BP 132/72   Pulse 81   Temp 98.7 F (37.1 C) (Oral)   Ht 5\' 11"  (1.803 m)   Wt 288 lb 9.6 oz (130.9 kg)   SpO2 96%   BMI 40.25 kg/m   Wt Readings from Last 3 Encounters:  09/08/22 288 lb 9.6 oz (130.9 kg)  05/12/22 255 lb 14.4 oz (116.1 kg)  03/10/22 283 lb 4.8 oz (128.5 kg)    Physical Exam Vitals and nursing note reviewed.  Constitutional:      General: He is not in acute distress.    Appearance: Normal appearance. He is obese. He is not ill-appearing, toxic-appearing or diaphoretic.  HENT:     Head: Normocephalic and atraumatic.     Right Ear: Tympanic membrane, ear canal and external ear normal. There is no impacted cerumen.     Left Ear: Tympanic membrane, ear canal and external ear normal. There is no impacted cerumen.     Nose: Nose normal. No congestion or rhinorrhea.     Mouth/Throat:     Mouth: Mucous membranes are moist.     Pharynx: Oropharynx is clear. No oropharyngeal exudate or posterior oropharyngeal erythema.  Eyes:     General: No scleral icterus.       Right eye: No discharge.        Left eye: No discharge.     Extraocular Movements: Extraocular movements intact.     Conjunctiva/sclera: Conjunctivae normal.     Pupils: Pupils are equal, round, and reactive to light.  Neck:     Vascular: No carotid  bruit.  Cardiovascular:     Rate and Rhythm: Normal rate and regular rhythm.     Pulses: Normal pulses.     Heart sounds: No murmur heard.    No friction rub. No gallop.  Pulmonary:     Effort: Pulmonary effort is normal. No respiratory distress.     Breath sounds: Normal breath sounds. No stridor. No wheezing, rhonchi or rales.  Chest:     Chest wall: No tenderness.  Abdominal:     General: Abdomen is flat. Bowel sounds are normal. There is no distension.     Palpations: Abdomen is soft. There is no mass.     Tenderness: There is no abdominal tenderness. There is no right CVA tenderness, left CVA tenderness, guarding or rebound.     Hernia: No hernia is present.  Genitourinary:    Comments: Genital exam deferred with shared decision making Musculoskeletal:        General: No swelling, tenderness, deformity or signs of injury.     Cervical back: Normal range of motion and neck supple. No rigidity. No muscular tenderness.     Right lower leg: No edema.     Left lower leg: No edema.  Lymphadenopathy:     Cervical: No cervical adenopathy.  Skin:    General: Skin is warm and dry.     Capillary Refill: Capillary refill takes less than 2 seconds.     Coloration: Skin is not jaundiced or pale.     Findings: No bruising, erythema, lesion or rash.  Neurological:     General: No focal deficit present.     Mental Status: He is alert and oriented to person, place, and time.     Cranial Nerves: No cranial nerve deficit.     Sensory: No sensory deficit.     Motor: No weakness.     Coordination: Coordination normal.     Gait: Gait normal.     Deep Tendon Reflexes: Reflexes normal.  Psychiatric:        Mood and Affect: Mood normal.        Behavior: Behavior normal.        Thought Content: Thought content normal.        Judgment: Judgment normal.     Results for orders placed or performed in visit on 01/24/22  Sjogrens syndrome-A extractable nuclear antibody  Result Value Ref Range    SSA (Ro) (ENA) Antibody, IgG <1.0 NEG <1.0 NEG AI  Sjogrens syndrome-B extractable nuclear antibody  Result Value Ref Range   SSB (La) (ENA) Antibody, IgG 1.2 POS (A) <1.0 NEG AI  C3 and C4  Result Value Ref Range   C3 Complement 148 82 - 185 mg/dL   C4 Complement 23 15 - 53 mg/dL  Rheumatoid factor  Result Value Ref Range   Rheumatoid fact SerPl-aCnc <14 <14 IU/mL  Sedimentation rate  Result Value Ref Range   Sed Rate 2 0 - 20 mm/h  C-reactive protein  Result Value Ref Range   CRP 1.1 <8.0 mg/L      Assessment & Plan:   Problem List  Items Addressed This Visit       Digestive   GERD (gastroesophageal reflux disease)    Under good control on current regimen. Continue current regimen. Continue to monitor. Call with any concerns. Refills given. Labs drawn today.       Relevant Medications   RABEprazole (ACIPHEX) 20 MG tablet     Genitourinary   Malignant neoplasm of prostate    Continue to follow with urology. PSA checked today. Continue to monitor.       Relevant Medications   aspirin EC (ASPIRIN LOW DOSE) 81 MG tablet   Other Relevant Orders   Comprehensive metabolic panel   PSA     Other   Hyperlipidemia    Under good control on current regimen. Continue current regimen. Continue to monitor. Call with any concerns. Refills given. Labs drawn today.      Relevant Medications   aspirin EC (ASPIRIN LOW DOSE) 81 MG tablet   simvastatin (ZOCOR) 40 MG tablet   Other Relevant Orders   Comprehensive metabolic panel   Lipid Panel w/o Chol/HDL Ratio   Depression, major, single episode, moderate    Under good control on current regimen. Continue current regimen. Continue to monitor. Call with any concerns. Refills given. Labs drawn today.      Relevant Medications   buPROPion (WELLBUTRIN XL) 300 MG 24 hr tablet   DULoxetine (CYMBALTA) 20 MG capsule   Other Relevant Orders   Comprehensive metabolic panel   Other Visit Diagnoses     Routine general medical  examination at a health care facility    -  Primary   Vaccines up to date. Screening labs checked today. Colonoscopy up to date. Continue diet and exercise. Call with any concerns. Continue to monitor.   Relevant Orders   Comprehensive metabolic panel   CBC with Differential/Platelet   Lipid Panel w/o Chol/HDL Ratio   PSA   TSH   Urinalysis, Routine w reflex microscopic   HIV Antibody (routine testing w rflx)   Irritant contact dermatitis due to plants, except food       Will treat with triamcinalone. Call with any concerns or if not getting better.   Memory loss       Offered referral to neurology- has been going on for abotu 2 years. Declines right now. Will let us know if he changes his mind.        LABORATORY TESTING:  Health maintenance labs ordered today as discussed above.   The natural history of prostate cancer and ongoing controversy regarding screening and potential treatment outcomes of prostate cancer has been discussed with the patient. The meaning of a false positive PSA and a false negative PSA has been discussed. He indicates understanding of the limitations of this screening test and wishes to proceed with screening PSA testing.   IMMUNIZATIONS:   - Tdap: Tetanus vaccination status reviewed: last tetanus booster within 10 years. - Influenza: Up to date - Pneumovax: Not applicable - Prevnar: Not applicable - COVID: Up to date - HPV: Not applicable - Shingrix vaccine: Up to date  SCREENING: - Colonoscopy: Up to date  Discussed with patient purpose of the colonoscopy is to detect colon cancer at curable precancerous or early stages    PATIENT COUNSELING:    Sexuality: Discussed sexually transmitted diseases, partner selection, use of condoms, avoidance of unintended pregnancy  and contraceptive alternatives.   Advised to avoid cigarette smoking.  I discussed with the patient that most people either abstain from alcohol or  drink within safe limits (<=14/week  and <=4 drinks/occasion for males, <=7/weeks and <= 3 drinks/occasion for females) and that the risk for alcohol disorders and other health effects rises proportionally with the number of drinks per week and how often a drinker exceeds daily limits.  Discussed cessation/primary prevention of drug use and availability of treatment for abuse.   Diet: Encouraged to adjust caloric intake to maintain  or achieve ideal body weight, to reduce intake of dietary saturated fat and total fat, to limit sodium intake by avoiding high sodium foods and not adding table salt, and to maintain adequate dietary potassium and calcium preferably from fresh fruits, vegetables, and low-fat dairy products.    stressed the importance of regular exercise  Injury prevention: Discussed safety belts, safety helmets, smoke detector, smoking near bedding or upholstery.   Dental health: Discussed importance of regular tooth brushing, flossing, and dental visits.   Follow up plan: NEXT PREVENTATIVE PHYSICAL DUE IN 1 YEAR. Return in about 6 months (around 03/10/2023).

## 2022-09-08 NOTE — Assessment & Plan Note (Signed)
Continue to follow with urology. PSA checked today. Continue to monitor.  

## 2022-09-09 ENCOUNTER — Telehealth: Payer: Self-pay

## 2022-09-09 NOTE — Telephone Encounter (Signed)
Called pt no answer, LM per DPR informing pt of results and appt scheduled for 10/21/22.

## 2022-09-09 NOTE — Telephone Encounter (Signed)
-----   Message from Sondra Come, MD sent at 09/09/2022  8:36 AM EDT ----- Good news, PSA stable and remains low at 0.6.  Needs follow-up within the next 1 to 2 months for yearly prostate cancer/urinary symptoms follow-up  Legrand Rams, MD 09/09/2022  \

## 2022-09-16 LAB — COMPREHENSIVE METABOLIC PANEL
ALT: 28 IU/L (ref 0–44)
AST: 21 IU/L (ref 0–40)
Albumin/Globulin Ratio: 2.3 — ABNORMAL HIGH (ref 1.2–2.2)
Albumin: 4.2 g/dL (ref 3.9–4.9)
Alkaline Phosphatase: 74 IU/L (ref 44–121)
BUN/Creatinine Ratio: 9 — ABNORMAL LOW (ref 10–24)
BUN: 11 mg/dL (ref 8–27)
Bilirubin Total: 0.3 mg/dL (ref 0.0–1.2)
CO2: 21 mmol/L (ref 20–29)
Calcium: 8.9 mg/dL (ref 8.6–10.2)
Chloride: 103 mmol/L (ref 96–106)
Creatinine, Ser: 1.16 mg/dL (ref 0.76–1.27)
Globulin, Total: 1.8 g/dL (ref 1.5–4.5)
Glucose: 155 mg/dL — ABNORMAL HIGH (ref 70–99)
Potassium: 4.1 mmol/L (ref 3.5–5.2)
Sodium: 137 mmol/L (ref 134–144)
Total Protein: 6 g/dL (ref 6.0–8.5)
eGFR: 72 mL/min/{1.73_m2} (ref >59)

## 2022-09-16 LAB — CBC WITH DIFFERENTIAL/PLATELET
Basophils Absolute: 0 10*3/uL (ref 0.0–0.2)
Basos: 0 %
EOS (ABSOLUTE): 0.2 10*3/uL (ref 0.0–0.4)
Eos: 4 %
Hematocrit: 44.7 % (ref 37.5–51.0)
Hemoglobin: 14.9 g/dL (ref 13.0–17.7)
Immature Grans (Abs): 0 10*3/uL (ref 0.0–0.1)
Immature Granulocytes: 0 %
Lymphocytes Absolute: 2.3 10*3/uL (ref 0.7–3.1)
Lymphs: 42 %
MCH: 29.6 pg (ref 26.6–33.0)
MCHC: 33.3 g/dL (ref 31.5–35.7)
MCV: 89 fL (ref 79–97)
Monocytes Absolute: 0.4 10*3/uL (ref 0.1–0.9)
Monocytes: 7 %
Neutrophils Absolute: 2.5 10*3/uL (ref 1.4–7.0)
Neutrophils: 47 %
Platelets: 162 10*3/uL (ref 150–450)
RBC: 5.03 x10E6/uL (ref 4.14–5.80)
RDW: 13.3 % (ref 11.6–15.4)
WBC: 5.4 10*3/uL (ref 3.4–10.8)

## 2022-09-16 LAB — LIPID PANEL W/O CHOL/HDL RATIO
Cholesterol, Total: 115 mg/dL (ref 100–199)
HDL: 37 mg/dL — ABNORMAL LOW (ref >39)
LDL Chol Calc (NIH): 55 mg/dL (ref 0–99)
Triglycerides: 128 mg/dL (ref 0–149)
VLDL Cholesterol Cal: 23 mg/dL (ref 5–40)

## 2022-09-16 LAB — HIV ANTIBODY (ROUTINE TESTING W REFLEX): HIV Screen 4th Generation wRfx: NONREACTIVE

## 2022-09-16 LAB — TSH: TSH: 1.86 u[IU]/mL (ref 0.450–4.500)

## 2022-09-16 LAB — PSA: Prostate Specific Ag, Serum: 0.6 ng/mL (ref 0.0–4.0)

## 2022-09-30 ENCOUNTER — Ambulatory Visit: Payer: BC Managed Care – PPO | Admitting: Podiatry

## 2022-10-02 IMAGING — CR DG CERVICAL SPINE 2 OR 3 VIEWS
2 series · 2 of 2 positions shown · non-contrast
Comparison: MRI 12/29/2019

CLINICAL DATA: Intraoperative imaging.  Anterior cervical fusion

EXAM:
CERVICAL SPINE - 2-3 VIEW

[AP (1 of 2)]
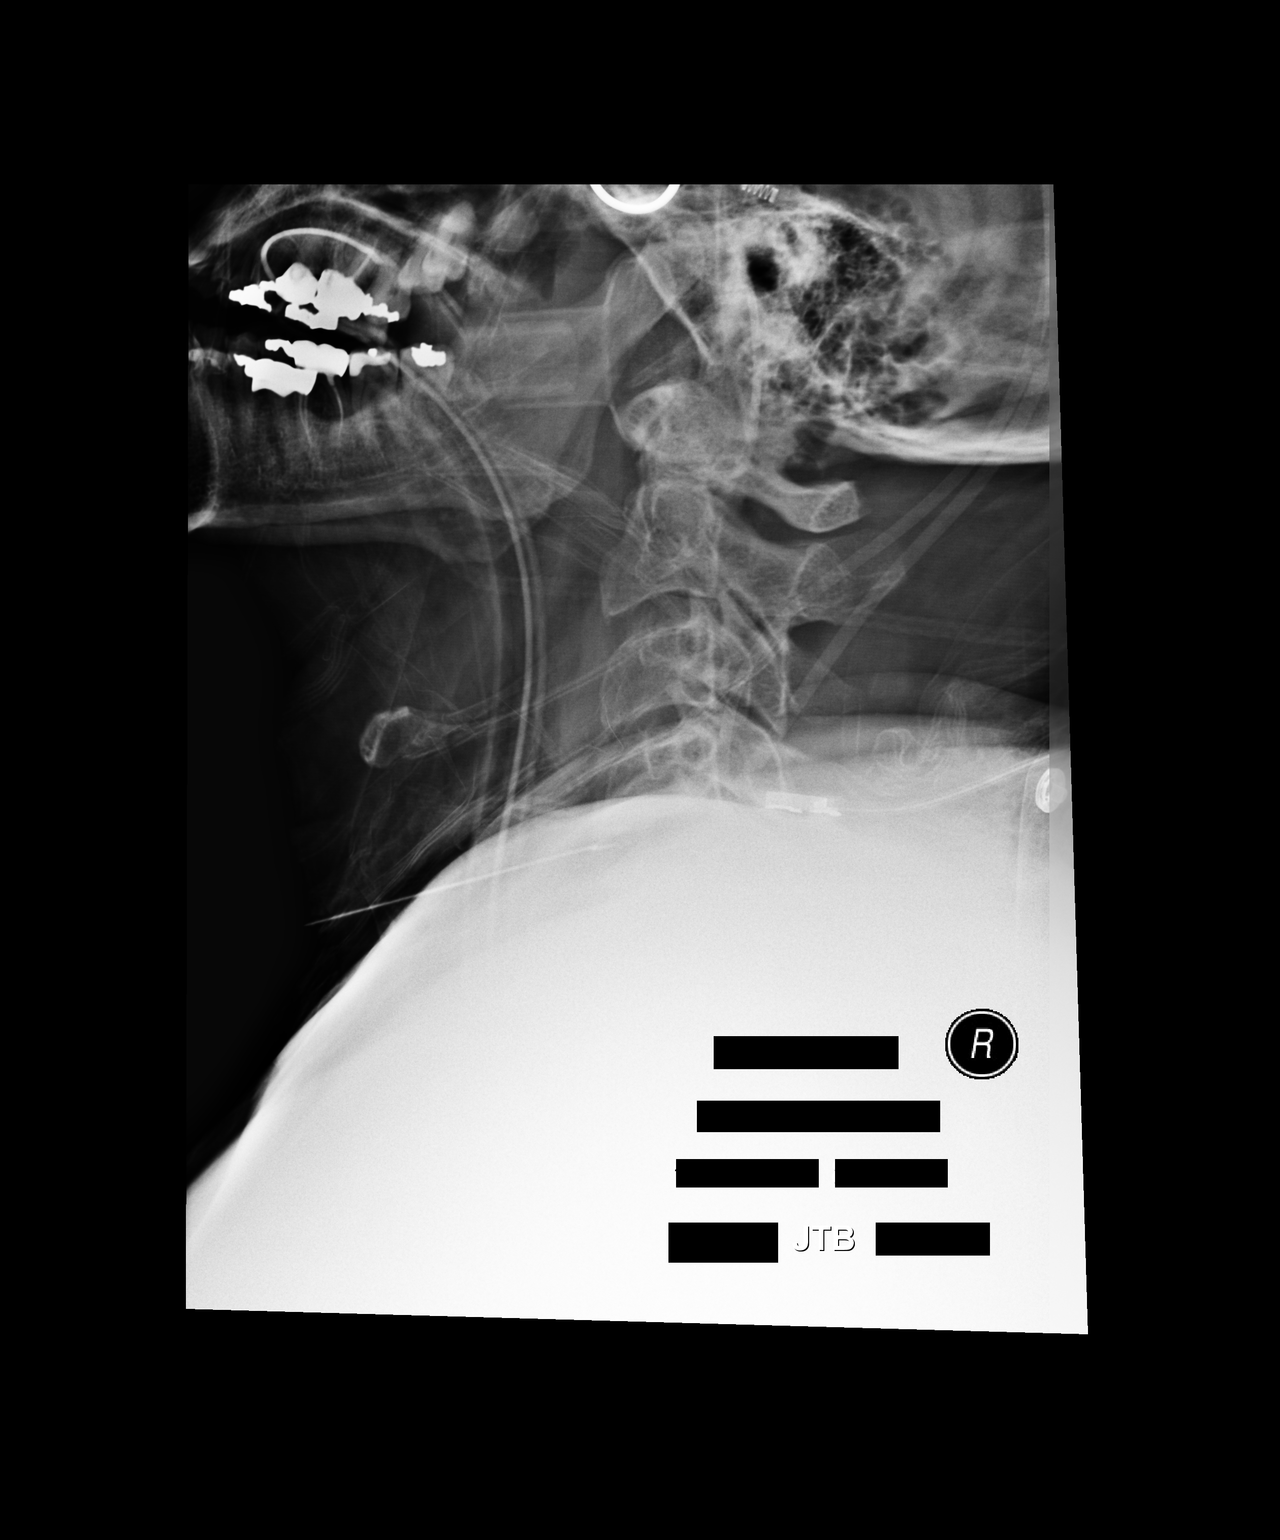

[AP (2 of 2)]
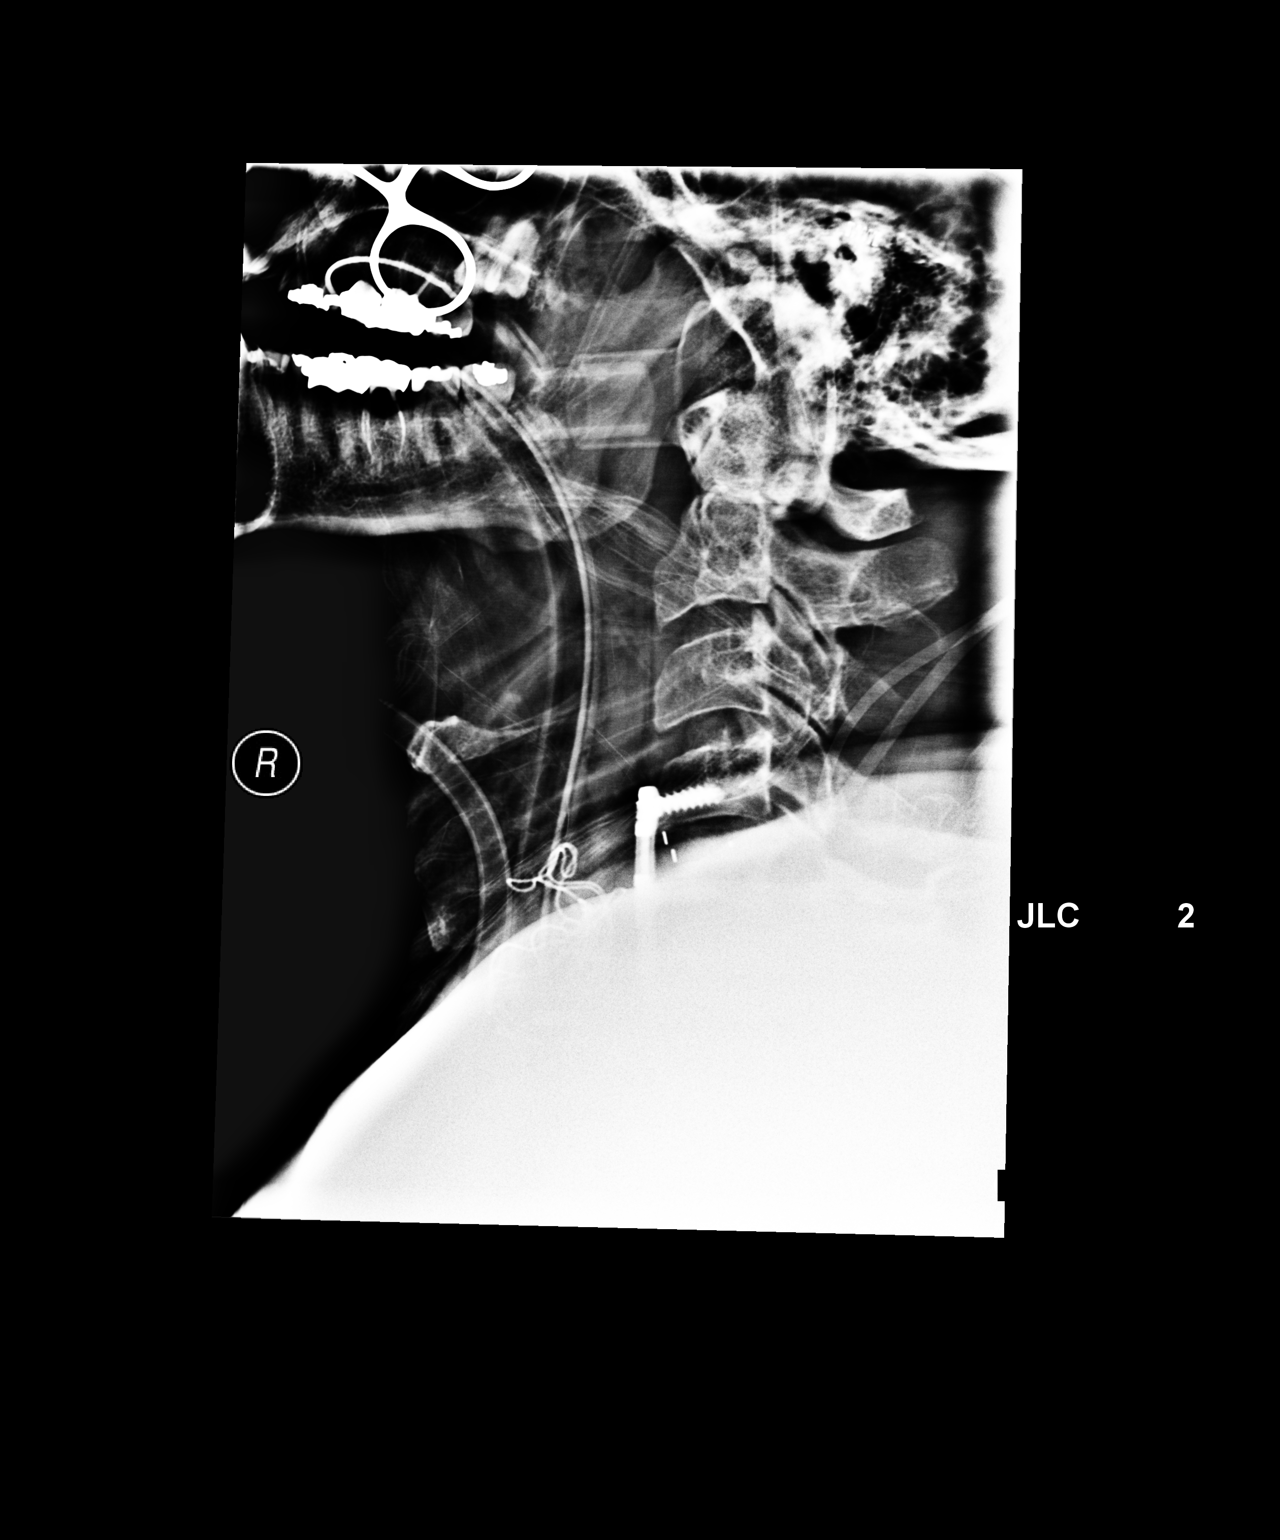

[2 of 2 positions shown; findings below may reference images not displayed]

FINDINGS: Intubated patient.  Sharp tip probe at the C4-C5 disc space.
IMPRESSION: Intraoperative radiograph as above.

## 2022-10-14 ENCOUNTER — Encounter: Payer: Self-pay | Admitting: Podiatry

## 2022-10-14 ENCOUNTER — Ambulatory Visit: Payer: BC Managed Care – PPO | Admitting: Podiatry

## 2022-10-14 ENCOUNTER — Ambulatory Visit (INDEPENDENT_AMBULATORY_CARE_PROVIDER_SITE_OTHER): Payer: BC Managed Care – PPO

## 2022-10-14 VITALS — BP 143/89 | HR 68

## 2022-10-14 DIAGNOSIS — S92354D Nondisplaced fracture of fifth metatarsal bone, right foot, subsequent encounter for fracture with routine healing: Secondary | ICD-10-CM

## 2022-10-14 NOTE — Progress Notes (Signed)
Chief Complaint  Patient presents with   Fracture    "It's doing.  I haven't worn the boot in about 3-4 days.  The bladder loses the air."      HPI: 62 y.o. male presenting today for follow-up evaluation of a fracture to the fifth metatarsal of the right foot.  Patient continues to do well.  He is no longer wearing the cam boot.  He says that he was tired of wearing the cam boot.  He is currently wearing tennis shoes with orthotics.  He says he has no pain with ambulation.  Brief history: Patient states that he was walking and his foot rolled off of the edge of the concrete sidewalk.  He noticed significant pain and tenderness.  DOI: 07/20/2022.    Past Medical History:  Diagnosis Date   Allergy    Cancer [facility])    Prostate   Carpal tunnel syndrome    DDD (degenerative disc disease), cervical    DDD (degenerative disc disease), cervical    Deafness in left ear    Dyspnea    with exertion   Edema    GERD (gastroesophageal reflux disease)    History of kidney stones    passed   Hyperlipidemia    Personal history of colonic polyps    Pinched nerve    upper and lower back   Prostate cancer (HCC)    Sleep apnea    Bi-pap   Sleep apnea    Stroke (HCC)    2013, Blind eye   TIA (transient ischemic attack) 07/2011   Umbilical hernia    Vision impairment    Blindness Right Eye    Past Surgical History:  Procedure Laterality Date   ANTERIOR CERVICAL DECOMP/DISCECTOMY FUSION N/A 03/30/2020   Procedure: Cervical Four-Five/Cervical Five-Six / Cervical Six-Seven  Anterior cervical decompression/discectomy/fusion;  Surgeon: Maeola Harman, MD;  Location: [facility];  Service: Neurosurgery;  Laterality: N/A;  3C/RM 21   APPENDECTOMY     carpel tunnel Bilateral    COLONOSCOPY  2015   Found 2 beign polyps, repeat 10 years   COLONOSCOPY WITH PROPOFOL N/A 03/30/2019   Procedure: COLONOSCOPY WITH PROPOFOL;  Surgeon: Wyline Mood, MD;  Location: [facility] ENDOSCOPY;  Service: Gastroenterology;   Laterality: N/A;   ESOPHAGOGASTRODUODENOSCOPY (EGD) WITH PROPOFOL N/A 03/30/2019   Procedure: ESOPHAGOGASTRODUODENOSCOPY (EGD) WITH PROPOFOL;  Surgeon: Wyline Mood, MD;  Location: [facility] ENDOSCOPY;  Service: Gastroenterology;  Laterality: N/A;   POSTERIOR CERVICAL LAMINECTOMY WITH MET- RX Right 05/11/2020   Procedure: RIGHT CERVICAL FOUR-FIVE, CERVICAL FIVE-SIX FORAMINOTOMY WITH METRX;  Surgeon: Maeola Harman, MD;  Location: [facility] OR;  Service: Neurosurgery;  Laterality: Right;  3C/RM 21   RADIOACTIVE SEED IMPLANT N/A 12/13/2018   Procedure: RADIOACTIVE SEED IMPLANT/BRACHYTHERAPY IMPLANT;  Surgeon: Sondra Come, MD;  Location: [facility] ORS;  Service: Urology;  Laterality: N/A;   TONSILLECTOMY      No Known Allergies   Physical Exam: General: The patient is alert and oriented x3 in no acute distress.  Dermatology: Skin is warm, dry and supple bilateral lower extremities. Negative for open lesions or macerations.  Vascular: Palpable pedal pulses bilaterally. Capillary refill within normal limits.  Negative for any significant edema or erythema  Neurological: Light touch and protective threshold grossly intact  Musculoskeletal Exam: No pedal deformities noted  Radiographic Exam RT foot 10/14/2022: Overall evidence of healing along the fracture site fifth metatarsal.  Assessment: 1.  Jones fracture fifth metatarsal right foot; stable with routine healing  -Patient evaluated. X-Rays  reviewed.  -Patient is currently weightbearing in tennis shoes.  He may continue as long as he has no pain -Slowly increase activity over the following 2-3 months -Return to clinic 3 months follow-up x-rays  *Teacher/professor for architecture, drafting, and design       Felecia Shelling, DPM [facility]  Dr. Felecia Shelling, DPM    [address] [place], [place] [address]                Office [phone]  Fax [phone]

## 2022-10-21 ENCOUNTER — Encounter: Payer: Self-pay | Admitting: Urology

## 2022-10-21 ENCOUNTER — Ambulatory Visit: Payer: BC Managed Care – PPO | Admitting: Urology

## 2022-10-21 VITALS — BP 136/83 | HR 69 | Ht 70.0 in | Wt 290.0 lb

## 2022-10-21 DIAGNOSIS — C61 Malignant neoplasm of prostate: Secondary | ICD-10-CM

## 2022-10-21 DIAGNOSIS — R102 Pelvic and perineal pain: Secondary | ICD-10-CM

## 2022-10-21 DIAGNOSIS — N529 Male erectile dysfunction, unspecified: Secondary | ICD-10-CM

## 2022-10-21 MED ORDER — CELECOXIB 200 MG PO CAPS: 200.0000 mg | ORAL_CAPSULE | Freq: Two times a day (BID) | ORAL | 0 refills | Status: DC

## 2022-10-21 MED ORDER — TADALAFIL 10 MG PO TABS: 10.0000 mg | ORAL_TABLET | Freq: Every day | ORAL | 6 refills | Status: DC

## 2022-10-21 NOTE — Progress Notes (Signed)
   10/21/2022 3:49 PM   Curtis Bray. 1960/06/07 [id number]  Reason for visit: Follow up prostate cancer, painful ejaculation, ED  HPI: He is a 62 year old male who was found to have an elevated PSA of 6.7 in March 2020 and was found to have high volume high risk prostate cancer with no evidence of metastatic disease. He underwent his first Lupron injection in May 2020, and underwent brachytherapy on 12/13/2018.    He opted to discontinue ADT after 1 year secondary to severe side effects of worsening depression and apathy.   PSA was undetectable after treatment, and has stabilized at 0.6 in April 2024, which is unchanged from April 2023.  We discussed the challenges in interpreting PSA values after brachytherapy and the concept of a PSA bounce.  Biochemical recurrence would be considered PSA greater than 2.   He has been off the Flomax for 6 to 9 months, and did not notice any significant worsening of the urinary symptoms.  He does report some worsening discomfort and burning with ejaculations of unclear etiology, he had mentioned that previously at our visit last year.  Difficult to ascertain if this correlates with stopping the Flomax.  Urinalysis was benign last month.  I recommended a trial of Celebrex.  Could also consider resuming the Flomax if no improvement on Celebrex.  Continues to use Cialis 10 mg every other day with good results.  Cialis refilled, sent to [email or web address] Trial of Celebrex 200 mg twice daily x 10 days for painful ejaculation, if no improvement would recommend resuming Flomax RTC 1 year PSA prior    Sondra Come, MD  [facility] [address], [address] [place], [place] [address] [phone]

## 2022-10-21 NOTE — Patient Instructions (Signed)
Your prescription was sent to [email or web address], you can go online and create a free account. Their prices are typically even better than [facility] or any local pharmacy

## 2022-10-24 ENCOUNTER — Other Ambulatory Visit: Payer: Self-pay

## 2022-10-24 DIAGNOSIS — C61 Malignant neoplasm of prostate: Secondary | ICD-10-CM

## 2022-10-27 ENCOUNTER — Inpatient Hospital Stay: Payer: BC Managed Care – PPO

## 2022-11-03 ENCOUNTER — Ambulatory Visit: Payer: BC Managed Care – PPO | Admitting: Radiation Oncology

## 2022-11-07 ENCOUNTER — Other Ambulatory Visit: Payer: Self-pay | Admitting: Family Medicine

## 2022-11-10 NOTE — Telephone Encounter (Signed)
Unable to refill per protocol, Rx request is too soon. Last refill 10/21/22 for 90 and 6 refills.  Requested Prescriptions  Pending Prescriptions Disp Refills   tadalafil (CIALIS) 10 MG tablet [Pharmacy Med Name: TADALAFIL 10 MG TABLET] 90 tablet     Sig: TAKE ONE TABLET BY MOUTH DAILY AS NEEDED     Urology: Erectile Dysfunction Agents Passed - 11/07/2022  5:38 PM      Passed - AST in normal range and within 360 days    AST  Date Value Ref Range Status  09/08/2022 21 0 - 40 IU/L Final   AST (SGOT) Piccolo, Waived  Date Value Ref Range Status  09/13/2015 26 11 - 38 U/L Final         Passed - ALT in normal range and within 360 days    ALT  Date Value Ref Range Status  09/08/2022 28 0 - 44 IU/L Final   ALT (SGPT) Piccolo, Waived  Date Value Ref Range Status  09/13/2015 38 10 - 47 U/L Final         Passed - Last BP in normal range    BP Readings from Last 1 Encounters:  10/21/22 136/83         Passed - Valid encounter within last 12 months    Recent Outpatient Visits           2 months ago Routine general medical examination at a health care facility   [facility], Megan P, DO   5 months ago Chronic left shoulder pain   [place] [facility] [place], Megan P, DO   6 months ago Depression, major, single episode, moderate (HCC)   [place] [facility] [place], Megan P, DO   8 months ago Depression, major, single episode, moderate (HCC)   [place] [facility] [place], Megan P, DO   1 year ago Sjogren's syndrome, with unspecified organ involvement [facility])   [place] [facility] [place], Oralia Rud, DO       Future Appointments             In 4 months Johnson, Oralia Rud, DO  [facility]   In 11 months Richardo Hanks, Laurette Schimke, MD [facility] [place]

## 2022-11-18 ENCOUNTER — Encounter: Payer: Self-pay | Admitting: Family Medicine

## 2022-12-16 ENCOUNTER — Telehealth: Payer: Self-pay | Admitting: Family Medicine

## 2022-12-16 NOTE — Telephone Encounter (Signed)
Copied from CRM [id number]. Topic: Referral - Status >> Dec 16, 2022 12:42 PM Macon Large wrote: Reason for CRM: Pt stated that he needs another referral for neurology because he was told that it would be another year before he could be seen. Pt requests that a referral for neurology be sent to another location. Pt is willing to go to [place], [place], or [employer] [facility]

## 2022-12-16 NOTE — Telephone Encounter (Deleted)
Copied from CRM [id number]. Topic: Complaint - Billing/Coding >> Dec 16, 2022 12:39 PM Curtis Bray wrote: DOS: 09/08/22 Details of complaint: Pt stated that an urinalysis was ordered during his physical but insurance will not cover the cost because of how it was coded.  How would the patient like to see this issue resolved? Pt requests that the urinalysis order be re-coded and resubmitted     Route to Research officer, political party.

## 2022-12-17 NOTE — Telephone Encounter (Signed)
Copied from CRM [id number]. Topic: General - Other >> Dec 17, 2022  9:25 AM Franchot Heidelberg wrote: Reason for CRM: Eber Jones from [facility] called to report that a referral request has been submitted without insurance and office notes. They are unable to reach the patient, he does not answer his phone  Fax: [phone] Best contact: [phone] option [phone]

## 2022-12-18 NOTE — Telephone Encounter (Signed)
E-BLUE CROSS BLUE SHIELD/[employer] [place] [[id number]] Member [id number] Subscriber [id number]

## 2022-12-24 ENCOUNTER — Telehealth: Payer: Self-pay

## 2022-12-24 NOTE — Telephone Encounter (Signed)
error 

## 2023-01-20 ENCOUNTER — Other Ambulatory Visit: Payer: Self-pay | Admitting: Family Medicine

## 2023-01-22 NOTE — Telephone Encounter (Signed)
Requested Prescriptions  Refused Prescriptions Disp Refills   meloxicam (MOBIC) 15 MG tablet [Pharmacy Med Name: MELOXICAM 15 MG TAB] 90 tablet 1    Sig: TAKE 1 TABLET BY MOUTH ONCE DAILY FOR JOINT PAIN     Analgesics:  COX2 Inhibitors Failed - 01/20/2023  5:25 PM      Failed - Manual Review: Labs are only required if the patient has taken medication for more than 8 weeks.      Passed - HGB in normal range and within 360 days    Hemoglobin  Date Value Ref Range Status  09/08/2022 14.9 13.0 - 17.7 g/dL Final         Passed - Cr in normal range and within 360 days    Creatinine, Ser  Date Value Ref Range Status  09/08/2022 1.16 0.76 - 1.27 mg/dL Final         Passed - HCT in normal range and within 360 days    Hematocrit  Date Value Ref Range Status  09/08/2022 44.7 37.5 - 51.0 % Final         Passed - AST in normal range and within 360 days    AST  Date Value Ref Range Status  09/08/2022 21 0 - 40 IU/L Final   AST (SGOT) Piccolo, Waived  Date Value Ref Range Status  09/13/2015 26 11 - 38 U/L Final         Passed - ALT in normal range and within 360 days    ALT  Date Value Ref Range Status  09/08/2022 28 0 - 44 IU/L Final   ALT (SGPT) Piccolo, Waived  Date Value Ref Range Status  09/13/2015 38 10 - 47 U/L Final         Passed - eGFR is 30 or above and within 360 days    GFR calc Af Amer  Date Value Ref Range Status  06/29/2020 84 >59 mL/min/1.73 Final    Comment:    **In accordance with recommendations from the NKF-ASN Task force,**   [facility] is in the process of updating its eGFR calculation to the   2021 CKD-EPI creatinine equation that estimates kidney function   without a race variable.    GFR, Estimated  Date Value Ref Range Status  05/11/2020 >60 >60 mL/min Final    Comment:    (NOTE) Calculated using the CKD-EPI Creatinine Equation (2021)    GFR calc non Af Amer  Date Value Ref Range Status  06/29/2020 72 >59 mL/min/1.73 Final   eGFR  Date  Value Ref Range Status  09/08/2022 72 >59 mL/min/1.73 Final         Passed - Patient is not pregnant      Passed - Valid encounter within last 12 months    Recent Outpatient Visits           4 months ago Routine general medical examination at a health care facility   [facility], [place] P, DO   7 months ago Chronic left shoulder pain   [place] [facility] [place], Megan P, DO   8 months ago Depression, major, single episode, moderate (HCC)   [facility], Megan P, DO   10 months ago Depression, major, single episode, moderate (HCC)   [place] [facility] [place], Megan P, DO   1 year ago Sjogren's syndrome, with unspecified organ involvement [facility])   [facility] [place], Megan P, DO  Future Appointments             In 1 month Johnson, Oralia Rud, DO [place] [facility]   In 9 months Richardo Hanks, Laurette Schimke, MD [facility] [place]

## 2023-01-28 ENCOUNTER — Ambulatory Visit: Payer: Self-pay

## 2023-01-28 NOTE — Telephone Encounter (Signed)
    Chief Complaint: Upper right abdominal pain. Started Saturday after eating a salad with expired dressing. 2 loose stools daily. No availability today, asking to be worked in today. Appointment made for tomorrow. Symptoms: Above Frequency: Saturday Pertinent Negatives: Patient denies vomiting Disposition: [] ED /[] Urgent Care (no appt availability in office) / [x] Appointment(In office/virtual)/ []  [place] [facility]/ [] Home Care/ [] Refused Recommended Disposition /[] [facility]/ []  Follow-up with PCP Additional Notes: Please advise pt.  Reason for Disposition  [1] MILD-MODERATE pain AND [2] constant AND [3] present > 2 hours  Answer Assessment - Initial Assessment Questions 1. LOCATION: "Where does it hurt?"      Below ribs, right side 2. RADIATION: "Does the pain shoot anywhere else?" (e.g., chest, back)     No 3. ONSET: "When did the pain begin?" (Minutes, hours or days ago)      Saturday 4. SUDDEN: "Gradual or sudden onset?"     Sudden 5. PATTERN "Does the pain come and go, or is it constant?"    - If it comes and goes: "How long does it last?" "Do you have pain now?"     (Note: Comes and goes means the pain is intermittent. It goes away completely between bouts.)    - If constant: "Is it getting better, staying the same, or getting worse?"      (Note: Constant means the pain never goes away completely; most serious pain is constant and gets worse.)      Constant 6. SEVERITY: "How bad is the pain?"  (e.g., Scale 1-10; mild, moderate, or severe)    - MILD (1-3): Doesn't interfere with normal activities, abdomen soft and not tender to touch.     - MODERATE (4-7): Interferes with normal activities or awakens from sleep, abdomen tender to touch.     - SEVERE (8-10): Excruciating pain, doubled over, unable to do any normal activities.       Now - 2 7. RECURRENT SYMPTOM: "Have you ever had this type of stomach pain before?" If Yes, ask: "When was the last time?" and  "What happened that time?"      No 8. CAUSE: "What do you think is causing the stomach pain?"     Unsure 9. RELIEVING/AGGRAVATING FACTORS: "What makes it better or worse?" (e.g., antacids, bending or twisting motion, bowel movement)     No 10. OTHER SYMPTOMS: "Do you have any other symptoms?" (e.g., back pain, diarrhea, fever, urination pain, vomiting)       Nausea Saturday, 2 loose stools a day  Protocols used: Abdominal Pain - Male-A-AH

## 2023-01-29 ENCOUNTER — Ambulatory Visit: Payer: BC Managed Care – PPO | Admitting: Pediatrics

## 2023-01-29 ENCOUNTER — Encounter: Payer: Self-pay | Admitting: Pediatrics

## 2023-01-29 VITALS — BP 136/87 | HR 78 | Temp 97.7°F | Wt 284.0 lb

## 2023-01-29 DIAGNOSIS — R109 Unspecified abdominal pain: Secondary | ICD-10-CM

## 2023-01-29 DIAGNOSIS — R110 Nausea: Secondary | ICD-10-CM

## 2023-01-29 MED ORDER — ONDANSETRON HCL 4 MG PO TABS: 4.0000 mg | ORAL_TABLET | Freq: Three times a day (TID) | ORAL | 0 refills | Status: DC | PRN

## 2023-01-29 NOTE — Progress Notes (Signed)
BP 136/87   Pulse 78   Temp 97.7 F (36.5 C) (Oral)   Wt 284 lb (128.8 kg)   SpO2 95%   BMI 40.75 kg/m    Subjective:    Patient ID: Curtis Bray., male    DOB: 22-Jul-1960, 62 y.o.   MRN: [id number]  HPI: Curtis Bray is a 62 y.o. male  Chief Complaint  Patient presents with   Abdominal Pain    Pt states that it started this past Saturday felt nauseous as well. Tender to the touch. Pt states also year old salad dressing that's when everything started     Abdominal Pain: Patient complains of abdominal pain after eating a strange salad dressing. The pain is described as aching, Pain is located in the RUQ, LUQ, epigastric without radiation, feels like a band of pain radiating to the back. Onset was Saturday. Unrelated to food. Symptoms have been gradually worsening since. Aggravating factors: none.  Alleviating factors: none. Associated symptoms: belching, diarrhea, and nausea. The patient denies anorexia, fever, hematochezia, and vomiting. He did wake up from sleep for the pain earlier this week Does not use NSAIDs, on chart review appears he takes ASA, celebrex and meloxicam Notes distant history of dark stools, unrelated to today's presentation Chronic GERD, managed with PPI Does not smoke Diarrhea  Relevant past medical, surgical, family and social history reviewed and updated as indicated. Interim medical history since our last visit reviewed. Allergies and medications reviewed and updated.  ROS per HPI unless specifically indicated above     Objective:    BP 136/87   Pulse 78   Temp 97.7 F (36.5 C) (Oral)   Wt 284 lb (128.8 kg)   SpO2 95%   BMI 40.75 kg/m   Wt Readings from Last 3 Encounters:  01/30/23 283 lb 15.2 oz (128.8 kg)  01/29/23 284 lb (128.8 kg)  10/21/22 290 lb (131.5 kg)     Physical Exam: GEN: alert, cooperative, and in NAD HENT: atraumatic, normocephalic EYES: anicteric sclera  CV: hemodynamically stable, RRR, no murmurs, rubs,  gallops  RESP: breathing comfortably on room air, no wheezing, crackles, rales GI/ABD:  soft, distended, tympanic, not peritonitic. +Murphy's sign during exam, though on repeat pain appears to distribute through LUQ and epigastric region  , no rebound tenderness EXT: warm and well perfused, no lower extremity edema, neurovascularly intact PSYCH: normal behavior and appropriate affect  Assessment & Plan:  Assessment & Plan    Myrtle was seen today for abdominal pain.  Diagnoses and all orders for this visit:  Acute abdominal pain Nausea without vomiting Hemodynamically stable in now acute distress but worsening symptoms. Consider biliary, pancreatic and liver etiologies given laterality of the pain he is describing. May also be viral gastroenteritis given preceding diarrhea. 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 Plan to get labs and imaging as below. Lower suspicion for ulcer but possible given some of his ASA, celebrex and meloxicam w/ known GERD, Reviewed atypical presentations of ACS symptoms possible as well. Pt given strict return precautions and anticipatory guidance for ED.  -     Comp Met (CMET) -     Lipase -     US Abdomen Limited RUQ (LIVER/GB); Future -     CBC w/Diff -     ondansetron (ZOFRAN) 4 MG tablet; Take 1 tablet (4 mg total) by mouth every 8 (eight) hours as needed for nausea or vomiting.  Follow up plan: Return if symptoms worsen or fail to improve.  Angle Dirusso Howell Pringle, MD Family Medicine

## 2023-01-29 NOTE — Patient Instructions (Addendum)
Will message you with results. If all labs and imaging does not show anything, please come back to clinic next week.  If symptoms worsening or not improving, please go to the emergency room or urgent chare.  For imaging: Please do not eat anything before imaging. Present to [facility] in [place] at 930am tomorrow 01/29/23.  For nausea: - take zofran 4mg  every 6 to 8 hours, if not helping after 1 hour, you can take a second dose. Don't take more than 4 tabs per 24 hours

## 2023-01-30 ENCOUNTER — Ambulatory Visit
Admission: RE | Admit: 2023-01-30 | Discharge: 2023-01-30 | Disposition: A | Discharge status: Home / Self Care | Payer: BC Managed Care – PPO | Source: Ambulatory Visit | Attending: Pediatrics | Admitting: Pediatrics

## 2023-01-30 ENCOUNTER — Emergency Department
Admission: EM | Admit: 2023-01-30 | Discharge: 2023-01-30 | Disposition: A | Discharge status: Home / Self Care | Payer: BC Managed Care – PPO | Attending: Emergency Medicine | Admitting: Emergency Medicine

## 2023-01-30 ENCOUNTER — Other Ambulatory Visit: Payer: Self-pay

## 2023-01-30 ENCOUNTER — Encounter: Payer: Self-pay | Admitting: Pediatrics

## 2023-01-30 ENCOUNTER — Ambulatory Visit: Payer: Self-pay

## 2023-01-30 ENCOUNTER — Ambulatory Visit: Payer: BC Managed Care – PPO

## 2023-01-30 DIAGNOSIS — R1010 Upper abdominal pain, unspecified: Secondary | ICD-10-CM | POA: Insufficient documentation

## 2023-01-30 DIAGNOSIS — R112 Nausea with vomiting, unspecified: Secondary | ICD-10-CM | POA: Diagnosis not present

## 2023-01-30 DIAGNOSIS — R197 Diarrhea, unspecified: Secondary | ICD-10-CM | POA: Diagnosis not present

## 2023-01-30 DIAGNOSIS — R109 Unspecified abdominal pain: Secondary | ICD-10-CM | POA: Insufficient documentation

## 2023-01-30 LAB — URINALYSIS, ROUTINE W REFLEX MICROSCOPIC
Bilirubin Urine: NEGATIVE
Glucose, UA: NEGATIVE mg/dL
Hgb urine dipstick: NEGATIVE
Ketones, ur: NEGATIVE mg/dL
Leukocytes,Ua: NEGATIVE
Nitrite: NEGATIVE
Protein, ur: NEGATIVE mg/dL
Specific Gravity, Urine: 1.009 (ref 1.005–1.030)
pH: 7 (ref 5.0–8.0)

## 2023-01-30 LAB — LIPASE, BLOOD: Lipase: 22 U/L (ref 11–51)

## 2023-01-30 LAB — COMPREHENSIVE METABOLIC PANEL
ALT: 33 IU/L (ref 0–44)
ALT: 39 U/L (ref 0–44)
AST: 26 IU/L (ref 0–40)
AST: 30 U/L (ref 15–41)
Albumin: 4.6 g/dL (ref 3.9–4.9)
Albumin: 4.1 g/dL (ref 3.5–5.0)
Alkaline Phosphatase: 76 IU/L (ref 44–121)
Alkaline Phosphatase: 65 U/L (ref 38–126)
Anion gap: 10 (ref 5–15)
BUN/Creatinine Ratio: 9 — ABNORMAL LOW (ref 10–24)
BUN: 10 mg/dL (ref 8–27)
BUN: 11 mg/dL (ref 8–23)
Bilirubin Total: 0.5 mg/dL (ref 0.0–1.2)
CO2: 21 mmol/L (ref 20–29)
CO2: 25 mmol/L (ref 22–32)
Calcium: 9.6 mg/dL (ref 8.6–10.2)
Calcium: 9.1 mg/dL (ref 8.9–10.3)
Chloride: 103 mmol/L (ref 96–106)
Chloride: 102 mmol/L (ref 98–111)
Creatinine, Ser: 1.15 mg/dL (ref 0.76–1.27)
Creatinine, Ser: 1.13 mg/dL (ref 0.61–1.24)
GFR, Estimated: >60 mL/min (ref >60)
Globulin, Total: 2 g/dL (ref 1.5–4.5)
Glucose, Bld: 116 mg/dL — ABNORMAL HIGH (ref 70–99)
Glucose: 104 mg/dL — ABNORMAL HIGH (ref 70–99)
Potassium: 4.5 mmol/L (ref 3.5–5.2)
Potassium: 4 mmol/L (ref 3.5–5.1)
Sodium: 139 mmol/L (ref 134–144)
Sodium: 137 mmol/L (ref 135–145)
Total Bilirubin: 1 mg/dL (ref 0.3–1.2)
Total Protein: 6.6 g/dL (ref 6.0–8.5)
Total Protein: 6.7 g/dL (ref 6.5–8.1)
eGFR: 72 mL/min/{1.73_m2} (ref >59)

## 2023-01-30 LAB — CBC
HCT: 45.7 % (ref 39.0–52.0)
Hemoglobin: 15.2 g/dL (ref 13.0–17.0)
MCH: 30.3 pg (ref 26.0–34.0)
MCHC: 33.3 g/dL (ref 30.0–36.0)
MCV: 91 fL (ref 80.0–100.0)
Platelets: 175 10*3/uL (ref 150–400)
RBC: 5.02 MIL/uL (ref 4.22–5.81)
RDW: 13.1 % (ref 11.5–15.5)
WBC: 5.5 10*3/uL (ref 4.0–10.5)
nRBC: 0 % (ref 0.0–0.2)

## 2023-01-30 LAB — CBC WITH DIFFERENTIAL/PLATELET
Basophils Absolute: 0 10*3/uL (ref 0.0–0.2)
Basos: 0 %
EOS (ABSOLUTE): 0.1 10*3/uL (ref 0.0–0.4)
Eos: 1 %
Hematocrit: 47.1 % (ref 37.5–51.0)
Hemoglobin: 15.9 g/dL (ref 13.0–17.7)
Immature Grans (Abs): 0 10*3/uL (ref 0.0–0.1)
Immature Granulocytes: 0 %
Lymphocytes Absolute: 1.5 10*3/uL (ref 0.7–3.1)
Lymphs: 17 %
MCH: 29.7 pg (ref 26.6–33.0)
MCHC: 33.8 g/dL (ref 31.5–35.7)
MCV: 88 fL (ref 79–97)
Monocytes Absolute: 0.7 10*3/uL (ref 0.1–0.9)
Monocytes: 8 %
Neutrophils Absolute: 6.2 10*3/uL (ref 1.4–7.0)
Neutrophils: 74 %
Platelets: 189 10*3/uL (ref 150–450)
RBC: 5.35 x10E6/uL (ref 4.14–5.80)
RDW: 12.7 % (ref 11.6–15.4)
WBC: 8.5 10*3/uL (ref 3.4–10.8)

## 2023-01-30 LAB — LIPASE: Lipase: 23 U/L (ref 13–78)

## 2023-01-30 MED ORDER — ONDANSETRON 8 MG PO TBDP: 8.0000 mg | ORAL_TABLET | Freq: Three times a day (TID) | ORAL | 0 refills | Status: DC | PRN

## 2023-01-30 NOTE — Telephone Encounter (Signed)
  Chief Complaint: moderate constant upper abdominal pressure  Symptoms: nausea, navel area pink tender and warm   Pertinent Negatives: Patient denies diarrhea, fever, urination pain or vomiting Disposition: [x] ED /[] Urgent Care (no appt availability in office) / [] Appointment(In office/virtual)/ []  [place] [facility]/ [] Home Care/ [] Refused Recommended Disposition /[] [place] [facility]/ []  Follow-up with PCP Additional Notes: per AVS if worsens go to ED Reason for Disposition  [1] MILD-MODERATE pain AND [2] constant AND [3] present > 2 hours  Answer Assessment - Initial Assessment Questions 1. LOCATION: "Where does it hurt?"      Under ribs right >left  5. PATTERN "Does the pain come and go, or is it constant?"    - If it comes and goes: "How long does it last?" "Do you have pain now?"     (Note: Comes and goes means the pain is intermittent. It goes away completely between bouts.)    - If constant: "Is it getting better, staying the same, or getting worse?"      (Note: Constant means the pain never goes away completely; most serious pain is constant and gets worse.)      constant 6. SEVERITY: "How bad is the pain?"  (e.g., Scale 1-10; mild, moderate, or severe)    - MILD (1-3): Doesn't interfere with normal activities, abdomen soft and not tender to touch.     - MODERATE (4-7): Interferes with normal activities or awakens from sleep, abdomen tender to touch.     - SEVERE (8-10): Excruciating pain, doubled over, unable to do any normal activities.       Constant  pressure 4/10  10. OTHER SYMPTOMS: "Do you have any other symptoms?" (e.g., back pain, diarrhea, fever, urination pain, vomiting)       Nausea, diarrhea, navel is pink tender and warm no  Protocols used: Abdominal Pain - Male-A-AH

## 2023-01-30 NOTE — ED Triage Notes (Addendum)
Pt here with abd pain since last Sat. Pt states he ate expired salad dressing and started feeling bad 2 hours later. Pt has been nauseous and threw up today. Pt also states he has a hernia in his navel, warmer than normal today. Pt denies fevers. Pt had [place] abd done a few days ago.

## 2023-01-30 NOTE — ED Provider Notes (Signed)
[facility] Provider Note   Event Date/Time   First MD Initiated Contact with Patient 01/30/23 1456     (approximate) History  Abdominal Pain  HPI Curtis Rambin. is a 62 y.o. male with a stated past medical history of TIA, degenerative disc disease, hyperlipidemia, GERD, and kidney stones who presents complaining of bilateral upper quadrant abdominal pain over the last 4 days.  Patient states that the symptoms began after eating an expired salad dressing.  Patient states that he initially began having significant nausea without vomiting and then developed diarrhea over the next day.  Patient states that his first episode of emesis was here prior to being evaluated.  Patient states that he was seen at urgent care earlier in the week and sent home with Zofran medication however patient states that he has not taken any.  Patient is also complaining of increasing pain and redness around his known umbilical hernia site.  Patient states that the symptoms have been worsened by vomiting ROS: Patient currently denies any vision changes, tinnitus, difficulty speaking, facial droop, sore throat, chest pain, shortness of breath, dysuria, or weakness/numbness/paresthesias in any extremity   Physical Exam  Triage Vital Signs: ED Triage Vitals  Encounter Vitals Group     BP 01/30/23 1408 139/87     Systolic BP Percentile --      Diastolic BP Percentile --      Pulse Rate 01/30/23 1408 80     Resp 01/30/23 1408 18     Temp 01/30/23 1411 97.7 F (36.5 C)     Temp Source 01/30/23 1411 Oral     SpO2 01/30/23 1408 94 %     Weight 01/30/23 1409 283 lb 15.2 oz (128.8 kg)     Height 01/30/23 1409 5\' 10"  (1.778 m)     Head Circumference --      Peak Flow --      Pain Score 01/30/23 1409 5     Pain Loc --      Pain Education --      Exclude from Growth Chart --    Most recent vital signs: Vitals:   01/30/23 1408 01/30/23 1411  BP: 139/87   Pulse: 80   Resp: 18   Temp:   97.7 F (36.5 C)  SpO2: 94%    General: Awake, oriented x4. CV:  Good peripheral perfusion.  Resp:  Normal effort.  Abd:  No distention.  Mild tenderness palpation bilateral upper quadrants.  Small 1.5 cm abdominal wall defect superior to the umbilicus that is easily reducible Other:  Middle-aged obese Caucasian male resting comfortably in no acute distress ED Results / Procedures / Treatments  Labs (all labs ordered are listed, but only abnormal results are displayed) Labs Reviewed  COMPREHENSIVE METABOLIC PANEL - Abnormal; Notable for the following components:      Result Value   Glucose, Bld 116 (*)    All other components within normal limits  URINALYSIS, ROUTINE W REFLEX MICROSCOPIC - Abnormal; Notable for the following components:   Color, Urine YELLOW (*)    APPearance CLEAR (*)    All other components within normal limits  LIPASE, BLOOD  CBC  RADIOLOGY ED MD interpretation: Ultrasound of the right upper quadrant interpreted independently by me shows no significant sonographic abnormality of the liver gallbladder -Agree with radiology assessment Official radiology report(s): US Abdomen Limited RUQ (LIVER/GB)  Result Date: 01/30/2023 CLINICAL DATA:  Acute abdominal pain in the right upper quadrant. Nausea for  6 days EXAM: ULTRASOUND ABDOMEN LIMITED RIGHT UPPER QUADRANT COMPARISON:  CT abdomen pelvis 08/19/2018 FINDINGS: Gallbladder: Gallstones: None Sludge: None Gallbladder Wall: Within normal limits Pericholecystic fluid: None Sonographic Murphy's Sign: Negative per technologist Common bile duct: Diameter: 4 mm Liver: Parenchymal echogenicity: Within normal limits Contours: Normal Lesions: None Portal vein: Patent.  Hepatopetal flow Other: None. IMPRESSION: No significant sonographic abnormality of the liver or gallbladder. Electronically Signed   By: Acquanetta Belling M.D.   On: 01/30/2023 10:44   PROCEDURES: Critical Care performed: No .1-3 Lead EKG Interpretation  Performed by:  Merwyn Katos, MD Authorized by: Merwyn Katos, MD     Interpretation: normal     ECG rate:  71   ECG rate assessment: normal     Rhythm: sinus rhythm     Ectopy: none     Conduction: normal    MEDICATIONS ORDERED IN ED: Medications - No data to display IMPRESSION / MDM / ASSESSMENT AND PLAN / ED COURSE  I reviewed the triage vital signs and the nursing notes.                             The patient is on the cardiac monitor to evaluate for evidence of arrhythmia and/or significant heart rate changes. Patient's presentation is most consistent with acute presentation with potential threat to life or bodily function. Patient presents for acute nausea/vomiting/diarrhea The cause of the patient's symptoms is not clear, but the patient is overall well appearing and is suspected to have a transient course of illness.  Given History and Exam there does not appear to be an emergent cause of the symptoms such as small bowel obstruction, coronary syndrome, bowel ischemia, DKA, pancreatitis, appendicitis, other acute abdomen or other emergent problem.  Reassessment: After treatment, the patient is feeling much better, tolerating PO fluids, and shows no signs of dehydration.   Disposition: Discharge home with prompt primary care physician follow up in the next 48 hours. Strict return precautions discussed.   FINAL CLINICAL IMPRESSION(S) / ED DIAGNOSES   Final diagnoses:  Pain of upper abdomen  Nausea, vomiting, and diarrhea   Rx / DC Orders   ED Discharge Orders          Ordered    ondansetron (ZOFRAN-ODT) 8 MG disintegrating tablet  Every 8 hours PRN        01/30/23 1702           Note:  This document was prepared using Dragon voice recognition software and may include unintentional dictation errors.   Merwyn Katos, MD 01/30/23 [phone]

## 2023-01-30 NOTE — ED Notes (Signed)
Pt states he had labs 2 days ago at a [facility] facility. Pt able to show normal labs on his phone.

## 2023-02-02 NOTE — Group Note (Deleted)

## 2023-03-10 ENCOUNTER — Ambulatory Visit: Payer: BC Managed Care – PPO | Admitting: Family Medicine

## 2023-03-10 ENCOUNTER — Encounter: Payer: Self-pay | Admitting: Family Medicine

## 2023-03-10 VITALS — BP 114/71 | HR 75 | Ht 70.0 in | Wt 281.4 lb

## 2023-03-10 DIAGNOSIS — K219 Gastro-esophageal reflux disease without esophagitis: Secondary | ICD-10-CM

## 2023-03-10 DIAGNOSIS — E782 Mixed hyperlipidemia: Secondary | ICD-10-CM | POA: Diagnosis not present

## 2023-03-10 DIAGNOSIS — F321 Major depressive disorder, single episode, moderate: Secondary | ICD-10-CM | POA: Diagnosis not present

## 2023-03-10 DIAGNOSIS — Z23 Encounter for immunization: Secondary | ICD-10-CM | POA: Diagnosis not present

## 2023-03-10 DIAGNOSIS — R413 Other amnesia: Secondary | ICD-10-CM

## 2023-03-10 DIAGNOSIS — M5030 Other cervical disc degeneration, unspecified cervical region: Secondary | ICD-10-CM

## 2023-03-10 DIAGNOSIS — G629 Polyneuropathy, unspecified: Secondary | ICD-10-CM

## 2023-03-10 DIAGNOSIS — Z6841 Body Mass Index (BMI) 40.0 and over, adult: Secondary | ICD-10-CM

## 2023-03-10 MED ORDER — BREXPIPRAZOLE 0.25 MG PO TABS: ORAL_TABLET | ORAL | 3 refills | Status: DC

## 2023-03-10 NOTE — Assessment & Plan Note (Signed)
Under good control on current regimen. Continue current regimen. Continue to monitor. Call with any concerns. Refills given. Labs drawn today.   

## 2023-03-10 NOTE — Assessment & Plan Note (Signed)
Continues with a lot of weakness and tremors. Wife notes that this is getting significantly worse and they would like to do something about it. Will refer back to neurology. He will reach out to neurosurgery for further evaluation. Call with any concerns.

## 2023-03-10 NOTE — Progress Notes (Signed)
BP 114/71   Pulse 75   Ht 5\' 10"  (1.778 m)   Wt 281 lb 6.4 oz (127.6 kg)   SpO2 96%   BMI 40.38 kg/m    Subjective:    Patient ID: Curtis Sheng., male    DOB: 1961-02-10, 62 y.o.   MRN: [id number]  HPI: Curtis Lutton. is a 62 y.o. male  Chief Complaint  Patient presents with   Depression   Hyperlipidemia   Gastroesophageal Reflux   HYPERLIPIDEMIA Hyperlipidemia status: excellent compliance Satisfied with current treatment?  yes Side effects:  no Medication compliance: excellent compliance Past cholesterol meds: simvastatin Supplements: none Aspirin:  yes The ASCVD Risk score (Arnett DK, et al., 2019) failed to calculate for the following reasons:   The patient has a prior MI or stroke diagnosis Chest pain:  no Coronary artery disease:  no  DEPRESSION- wife is very concerned about him. She notes that he seems to be in his own world and has been having issues with memory.  Mood status: uncontrolled Satisfied with current treatment?: no Symptom severity: moderate  Duration of current treatment : chronic Side effects: no Medication compliance: excellent compliance Psychotherapy/counseling: no  Previous psychiatric medications: wellbutrin, cymbalta Depressed mood: yes Anxious mood: no Anhedonia: yes Significant weight loss or gain: no Insomnia: no  Fatigue: yes Feelings of worthlessness or guilt: yes Impaired concentration/indecisiveness: yes Suicidal ideations: no Hopelessness: yes Crying spells: no    01/29/2023    4:05 PM 09/08/2022    3:16 PM 06/09/2022    3:54 PM 05/12/2022    4:04 PM 03/10/2022    2:09 PM  Depression screen PHQ 2/9  Decreased Interest 2 1 0 1 1  Down, Depressed, Hopeless 0 1 0 0 0  PHQ - 2 Score 2 2 0 1 1  Altered sleeping 0 0 1 0 0  Tired, decreased energy 2 2 1 2 2   Change in appetite 0 2 0 1 1  Feeling bad or failure about yourself  0 0 0 0 0  Trouble concentrating 3 2 1 2 1   Moving slowly or fidgety/restless 1 2 1 1 2    Suicidal thoughts 0 0 0 0 0  PHQ-9 Score 8 10 4 7 7   Difficult doing work/chores Not difficult at all  Not difficult at all Somewhat difficult Somewhat difficult   Having issues with memory and the shaking and the dropping. Did memory test with the neurologist.  Did not do the   Relevant past medical, surgical, family and social history reviewed and updated as indicated. Interim medical history since our last visit reviewed. Allergies and medications reviewed and updated.  Review of Systems  Constitutional: Negative.   Respiratory: Negative.    Cardiovascular: Negative.   Gastrointestinal: Negative.   Musculoskeletal:  Positive for back pain, myalgias, neck pain and neck stiffness. Negative for arthralgias, gait problem and joint swelling.  Skin: Negative.   Neurological:  Positive for tremors, weakness and numbness. Negative for dizziness, seizures, syncope, facial asymmetry, speech difficulty, light-headedness and headaches.  Psychiatric/Behavioral:  Positive for dysphoric mood. Negative for agitation, behavioral problems, confusion, decreased concentration, hallucinations, self-injury, sleep disturbance and suicidal ideas. The patient is nervous/anxious. The patient is not hyperactive.     Per HPI unless specifically indicated above     Objective:    BP 114/71   Pulse 75   Ht 5\' 10"  (1.778 m)   Wt 281 lb 6.4 oz (127.6 kg)   SpO2 96%   BMI  40.38 kg/m   Wt Readings from Last 3 Encounters:  03/10/23 281 lb 6.4 oz (127.6 kg)  01/30/23 283 lb 15.2 oz (128.8 kg)  01/29/23 284 lb (128.8 kg)    Physical Exam Vitals and nursing note reviewed.  Constitutional:      General: He is not in acute distress.    Appearance: Normal appearance. He is obese. He is not ill-appearing, toxic-appearing or diaphoretic.  HENT:     Head: Normocephalic and atraumatic.     Right Ear: External ear normal.     Left Ear: External ear normal.     Nose: Nose normal.     Mouth/Throat:     Mouth:  Mucous membranes are moist.     Pharynx: Oropharynx is clear.  Eyes:     General: No scleral icterus.       Right eye: No discharge.        Left eye: No discharge.     Extraocular Movements: Extraocular movements intact.     Conjunctiva/sclera: Conjunctivae normal.     Pupils: Pupils are equal, round, and reactive to light.  Cardiovascular:     Rate and Rhythm: Normal rate and regular rhythm.     Pulses: Normal pulses.     Heart sounds: Normal heart sounds. No murmur heard.    No friction rub. No gallop.  Pulmonary:     Effort: Pulmonary effort is normal. No respiratory distress.     Breath sounds: Normal breath sounds. No stridor. No wheezing, rhonchi or rales.  Chest:     Chest wall: No tenderness.  Musculoskeletal:        General: Normal range of motion.     Cervical back: Normal range of motion and neck supple.  Skin:    General: Skin is warm and dry.     Capillary Refill: Capillary refill takes less than 2 seconds.     Coloration: Skin is not jaundiced or pale.     Findings: No bruising, erythema, lesion or rash.  Neurological:     General: No focal deficit present.     Mental Status: He is alert and oriented to person, place, and time. Mental status is at baseline.  Psychiatric:        Mood and Affect: Mood normal.        Behavior: Behavior normal.        Thought Content: Thought content normal.        Judgment: Judgment normal.     Results for orders placed or performed during the hospital encounter of 01/30/23  Lipase, blood  Result Value Ref Range   Lipase 22 11 - 51 U/L  Comprehensive metabolic panel  Result Value Ref Range   Sodium 137 135 - 145 mmol/L   Potassium 4.0 3.5 - 5.1 mmol/L   Chloride 102 98 - 111 mmol/L   CO2 25 22 - 32 mmol/L   Glucose, Bld 116 (H) 70 - 99 mg/dL   BUN 11 8 - 23 mg/dL   Creatinine, Ser 4.09 0.61 - 1.24 mg/dL   Calcium 9.1 8.9 - 81.1 mg/dL   Total Protein 6.7 6.5 - 8.1 g/dL   Albumin 4.1 3.5 - 5.0 g/dL   AST 30 15 - 41 U/L    ALT 39 0 - 44 U/L   Alkaline Phosphatase 65 38 - 126 U/L   Total Bilirubin 1.0 0.3 - 1.2 mg/dL   GFR, Estimated >91 >47 mL/min   Anion gap 10 5 - 15  CBC  Result Value Ref Range   WBC 5.5 4.0 - 10.5 K/uL   RBC 5.02 4.22 - 5.81 MIL/uL   Hemoglobin 15.2 13.0 - 17.0 g/dL   HCT 19.1 47.8 - 29.5 %   MCV 91.0 80.0 - 100.0 fL   MCH 30.3 26.0 - 34.0 pg   MCHC 33.3 30.0 - 36.0 g/dL   RDW 62.1 30.8 - 65.7 %   Platelets 175 150 - 400 K/uL   nRBC 0.0 0.0 - 0.2 %  Urinalysis, Routine w reflex microscopic -Urine, Clean Catch  Result Value Ref Range   Color, Urine YELLOW (A) YELLOW   APPearance CLEAR (A) CLEAR   Specific Gravity, Urine 1.009 1.005 - 1.030   pH 7.0 5.0 - 8.0   Glucose, UA NEGATIVE NEGATIVE mg/dL   Hgb urine dipstick NEGATIVE NEGATIVE   Bilirubin Urine NEGATIVE NEGATIVE   Ketones, ur NEGATIVE NEGATIVE mg/dL   Protein, ur NEGATIVE NEGATIVE mg/dL   Nitrite NEGATIVE NEGATIVE   Leukocytes,Ua NEGATIVE NEGATIVE      Assessment & Plan:   Problem List Items Addressed This Visit       Digestive   GERD (gastroesophageal reflux disease)    Under good control on current regimen. Continue current regimen. Continue to monitor. Call with any concerns. Refills given.  Labs drawn today.       Relevant Orders   CBC with Differential/Platelet     Musculoskeletal and Integument   DDD (degenerative disc disease), cervical    Continues with a lot of weakness and tremors. Wife notes that this is getting significantly worse and they would like to do something about it. Will refer back to neurology. He will reach out to neurosurgery for further evaluation. Call with any concerns.       Relevant Orders   Ambulatory referral to Neurology     Other   Hyperlipidemia    Under good control on current regimen. Continue current regimen. Continue to monitor. Call with any concerns. Refills given.  Labs drawn today.       Relevant Orders   Comprehensive metabolic panel   Lipid Panel w/o  Chol/HDL Ratio   Morbid obesity with BMI of 40.0-44.9, adult (HCC)    Encouraged diet and exercise with goal of losing 1-2lbs per week. Call with any concerns. Continue to monitor.       Depression, major, single episode, moderate (HCC) - Primary    Not under good control. Will start him on rexaulti and recheck in about a month. Call with any concerns or if not improving.       Other Visit Diagnoses     Memory loss       Neuropsych testing normal- thought to be due to depression. To treat depression. Will refer back to neurology- await their input.   Relevant Orders   Ambulatory referral to Neurology   Neuropathy       Will check labs and refer back to neurology- likely due to cervical radiculopathy. Discussed referral back to PT. He will reach back out to neurosurgery.   Relevant Orders   Ambulatory referral to Neurology   B12   Needs flu shot       Flu shot given today.   Relevant Orders   Flu vaccine trivalent PF, 6mos and older(Flulaval,Afluria,Fluarix,Fluzone) (Completed)        Follow up plan: Return in about 4 weeks (around 04/07/2023).

## 2023-03-10 NOTE — Assessment & Plan Note (Signed)
Encouraged diet and exercise with goal of losing 1-2lbs per week. Call with any concerns. Continue to monitor.

## 2023-03-10 NOTE — Assessment & Plan Note (Signed)
Not under good control. Will start him on rexaulti and recheck in about a month. Call with any concerns or if not improving.

## 2023-03-11 LAB — COMPREHENSIVE METABOLIC PANEL
ALT: 38 [IU]/L (ref 0–44)
AST: 24 [IU]/L (ref 0–40)
Albumin: 4.4 g/dL (ref 3.9–4.9)
Alkaline Phosphatase: 93 [IU]/L (ref 44–121)
BUN/Creatinine Ratio: 9 — ABNORMAL LOW (ref 10–24)
BUN: 11 mg/dL (ref 8–27)
Bilirubin Total: 0.4 mg/dL (ref 0.0–1.2)
CO2: 20 mmol/L (ref 20–29)
Calcium: 9.4 mg/dL (ref 8.6–10.2)
Chloride: 102 mmol/L (ref 96–106)
Creatinine, Ser: 1.2 mg/dL (ref 0.76–1.27)
Globulin, Total: 1.9 g/dL (ref 1.5–4.5)
Glucose: 124 mg/dL — ABNORMAL HIGH (ref 70–99)
Potassium: 4.2 mmol/L (ref 3.5–5.2)
Sodium: 139 mmol/L (ref 134–144)
Total Protein: 6.3 g/dL (ref 6.0–8.5)
eGFR: 68 mL/min/{1.73_m2} (ref >59)

## 2023-03-11 LAB — CBC WITH DIFFERENTIAL/PLATELET
Basophils Absolute: 0 10*3/uL (ref 0.0–0.2)
Basos: 0 %
EOS (ABSOLUTE): 0.2 10*3/uL (ref 0.0–0.4)
Eos: 3 %
Hematocrit: 47 % (ref 37.5–51.0)
Hemoglobin: 15.4 g/dL (ref 13.0–17.7)
Immature Grans (Abs): 0 10*3/uL (ref 0.0–0.1)
Immature Granulocytes: 0 %
Lymphocytes Absolute: 2.4 10*3/uL (ref 0.7–3.1)
Lymphs: 36 %
MCH: 29.8 pg (ref 26.6–33.0)
MCHC: 32.8 g/dL (ref 31.5–35.7)
MCV: 91 fL (ref 79–97)
Monocytes Absolute: 0.4 10*3/uL (ref 0.1–0.9)
Monocytes: 6 %
Neutrophils Absolute: 3.5 10*3/uL (ref 1.4–7.0)
Neutrophils: 55 %
Platelets: 179 10*3/uL (ref 150–450)
RBC: 5.17 x10E6/uL (ref 4.14–5.80)
RDW: 12.6 % (ref 11.6–15.4)
WBC: 6.5 10*3/uL (ref 3.4–10.8)

## 2023-03-11 LAB — LIPID PANEL W/O CHOL/HDL RATIO
Cholesterol, Total: 147 mg/dL (ref 100–199)
HDL: 35 mg/dL — ABNORMAL LOW (ref >39)
LDL Chol Calc (NIH): 38 mg/dL (ref 0–99)
Triglycerides: 523 mg/dL — ABNORMAL HIGH (ref 0–149)
VLDL Cholesterol Cal: 74 mg/dL — ABNORMAL HIGH (ref 5–40)

## 2023-03-12 LAB — VITAMIN B12: Vitamin B-12: >2000 pg/mL — ABNORMAL HIGH (ref 232–1245)

## 2023-03-13 ENCOUNTER — Telehealth: Payer: Self-pay | Admitting: Family Medicine

## 2023-03-13 MED ORDER — ARIPIPRAZOLE 2 MG PO TABS: 2.0000 mg | ORAL_TABLET | Freq: Every day | ORAL | 2 refills | Status: DC

## 2023-03-13 NOTE — Telephone Encounter (Signed)
-----   Message from [facility] sent at 03/12/2023  4:47 PM EDT ----- Aripiprazole Olanzapine Paliperidone Quetiapine Quetiapine extended release Risperidone Ziprasidone

## 2023-04-07 ENCOUNTER — Telehealth: Payer: Self-pay

## 2023-04-07 ENCOUNTER — Ambulatory Visit: Payer: BC Managed Care – PPO | Admitting: Family Medicine

## 2023-04-07 ENCOUNTER — Encounter: Payer: Self-pay | Admitting: Family Medicine

## 2023-04-07 VITALS — BP 135/74 | HR 64 | Ht 70.0 in | Wt 274.2 lb

## 2023-04-07 DIAGNOSIS — F321 Major depressive disorder, single episode, moderate: Secondary | ICD-10-CM | POA: Diagnosis not present

## 2023-04-07 NOTE — Telephone Encounter (Signed)
-----   Message from Olevia Perches sent at 04/07/2023  3:28 PM EST ----- Covid and RSV shot in in October, [facility] [place]

## 2023-04-07 NOTE — Telephone Encounter (Signed)
[facility] registry was pulled and updated to reflect in patient's chart.

## 2023-04-07 NOTE — Progress Notes (Signed)
BP 135/74   Pulse 64   Ht 5\' 10"  (1.778 m)   Wt 274 lb 3.2 oz (124.4 kg)   SpO2 97%   BMI 39.34 kg/m    Subjective:    Patient ID: Curtis Sheng., male    DOB: 04/24/1961, 62 y.o.   MRN: [id number]  HPI: Curtis Abella. is a 62 y.o. male  Chief Complaint  Patient presents with   Depression    Patient says he was unable to pick up those two prescriptions even with the card. Patient says the discount card does not help the patient.   Hip Problem   DEPRESSION- was not able to start rexaulti, didn't take the Abilify regularly Mood status: better Satisfied with current treatment?: no Symptom severity: moderate  Duration of current treatment : chronic Side effects: no Medication compliance: good compliance Psychotherapy/counseling: no  Previous psychiatric medications: cymbalta, wellbutrin, abilify Depressed mood: yes Anxious mood: yes Anhedonia: yes Significant weight loss or gain: no Insomnia: no  Fatigue: yes Feelings of worthlessness or guilt: no Impaired concentration/indecisiveness: no Suicidal ideations: no Hopelessness: no Crying spells: no    04/07/2023    3:15 PM 01/29/2023    4:05 PM 09/08/2022    3:16 PM 06/09/2022    3:54 PM 05/12/2022    4:04 PM  Depression screen PHQ 2/9  Decreased Interest 2 2 1  0 1  Down, Depressed, Hopeless 1 0 1 0 0  PHQ - 2 Score 3 2 2  0 1  Altered sleeping 0 0 0 1 0  Tired, decreased energy 2 2 2 1 2   Change in appetite 0 0 2 0 1  Feeling bad or failure about yourself  0 0 0 0 0  Trouble concentrating 2 3 2 1 2   Moving slowly or fidgety/restless 1 1 2 1 1   Suicidal thoughts 0 0 0 0 0  PHQ-9 Score 8 8 10 4 7   Difficult doing work/chores  Not difficult at all  Not difficult at all Somewhat difficult    Relevant past medical, surgical, family and social history reviewed and updated as indicated. Interim medical history since our last visit reviewed. Allergies and medications reviewed and updated.  Review of Systems   Constitutional: Negative.   Respiratory: Negative.    Cardiovascular: Negative.   Gastrointestinal: Negative.   Musculoskeletal: Negative.   Psychiatric/Behavioral:  Positive for dysphoric mood. Negative for agitation, behavioral problems, confusion, decreased concentration, hallucinations, self-injury, sleep disturbance and suicidal ideas. The patient is nervous/anxious. The patient is not hyperactive.     Per HPI unless specifically indicated above     Objective:    BP 135/74   Pulse 64   Ht 5\' 10"  (1.778 m)   Wt 274 lb 3.2 oz (124.4 kg)   SpO2 97%   BMI 39.34 kg/m   Wt Readings from Last 3 Encounters:  04/07/23 274 lb 3.2 oz (124.4 kg)  03/10/23 281 lb 6.4 oz (127.6 kg)  01/30/23 283 lb 15.2 oz (128.8 kg)    Physical Exam Vitals and nursing note reviewed.  Constitutional:      General: He is not in acute distress.    Appearance: Normal appearance. He is obese. He is not ill-appearing, toxic-appearing or diaphoretic.  HENT:     Head: Normocephalic and atraumatic.     Right Ear: External ear normal.     Left Ear: External ear normal.     Nose: Nose normal.     Mouth/Throat:  Mouth: Mucous membranes are moist.     Pharynx: Oropharynx is clear.  Eyes:     General: No scleral icterus.       Right eye: No discharge.        Left eye: No discharge.     Extraocular Movements: Extraocular movements intact.     Conjunctiva/sclera: Conjunctivae normal.     Pupils: Pupils are equal, round, and reactive to light.  Cardiovascular:     Rate and Rhythm: Normal rate and regular rhythm.     Pulses: Normal pulses.     Heart sounds: Normal heart sounds. No murmur heard.    No friction rub. No gallop.  Pulmonary:     Effort: Pulmonary effort is normal. No respiratory distress.     Breath sounds: Normal breath sounds. No stridor. No wheezing, rhonchi or rales.  Chest:     Chest wall: No tenderness.  Musculoskeletal:        General: Normal range of motion.     Cervical back:  Normal range of motion and neck supple.  Skin:    General: Skin is warm and dry.     Capillary Refill: Capillary refill takes less than 2 seconds.     Coloration: Skin is not jaundiced or pale.     Findings: No bruising, erythema, lesion or rash.  Neurological:     General: No focal deficit present.     Mental Status: He is alert and oriented to person, place, and time. Mental status is at baseline.  Psychiatric:        Mood and Affect: Mood normal.        Behavior: Behavior normal.        Thought Content: Thought content normal.        Judgment: Judgment normal.     Results for orders placed or performed in visit on 03/10/23  CBC with Differential/Platelet  Result Value Ref Range   WBC 6.5 3.4 - 10.8 x10E3/uL   RBC 5.17 4.14 - 5.80 x10E6/uL   Hemoglobin 15.4 13.0 - 17.7 g/dL   Hematocrit 43.3 29.5 - 51.0 %   MCV 91 79 - 97 fL   MCH 29.8 26.6 - 33.0 pg   MCHC 32.8 31.5 - 35.7 g/dL   RDW 18.8 41.6 - 60.6 %   Platelets 179 150 - 450 x10E3/uL   Neutrophils 55 Not Estab. %   Lymphs 36 Not Estab. %   Monocytes 6 Not Estab. %   Eos 3 Not Estab. %   Basos 0 Not Estab. %   Neutrophils Absolute 3.5 1.4 - 7.0 x10E3/uL   Lymphocytes Absolute 2.4 0.7 - 3.1 x10E3/uL   Monocytes Absolute 0.4 0.1 - 0.9 x10E3/uL   EOS (ABSOLUTE) 0.2 0.0 - 0.4 x10E3/uL   Basophils Absolute 0.0 0.0 - 0.2 x10E3/uL   Immature Granulocytes 0 Not Estab. %   Immature Grans (Abs) 0.0 0.0 - 0.1 x10E3/uL  Comprehensive metabolic panel  Result Value Ref Range   Glucose 124 (H) 70 - 99 mg/dL   BUN 11 8 - 27 mg/dL   Creatinine, Ser 3.01 0.76 - 1.27 mg/dL   eGFR 68 >60 FU/XNA/3.55   BUN/Creatinine Ratio 9 (L) 10 - 24   Sodium 139 134 - 144 mmol/L   Potassium 4.2 3.5 - 5.2 mmol/L   Chloride 102 96 - 106 mmol/L   CO2 20 20 - 29 mmol/L   Calcium 9.4 8.6 - 10.2 mg/dL   Total Protein 6.3 6.0 - 8.5 g/dL  Albumin 4.4 3.9 - 4.9 g/dL   Globulin, Total 1.9 1.5 - 4.5 g/dL   Bilirubin Total 0.4 0.0 - 1.2 mg/dL    Alkaline Phosphatase 93 44 - 121 IU/L   AST 24 0 - 40 IU/L   ALT 38 0 - 44 IU/L  Lipid Panel w/o Chol/HDL Ratio  Result Value Ref Range   Cholesterol, Total 147 100 - 199 mg/dL   Triglycerides 725 (H) 0 - 149 mg/dL   HDL 35 (L) >36 mg/dL   VLDL Cholesterol Cal 74 (H) 5 - 40 mg/dL   LDL Chol Calc (NIH) 38 0 - 99 mg/dL  U44  Result Value Ref Range   Vitamin B-12 >2000 (H) 232 - 1245 pg/mL      Assessment & Plan:   Problem List Items Addressed This Visit       Other   Depression, major, single episode, moderate (HCC) - Primary    Did not take his abilify regularly- will start it daily and recheck in about a month. Call with any concerns or if not improving.         Follow up plan: Return in about 4 weeks (around 05/05/2023).

## 2023-04-12 ENCOUNTER — Encounter: Payer: Self-pay | Admitting: Family Medicine

## 2023-04-12 NOTE — Assessment & Plan Note (Signed)
Did not take his abilify regularly- will start it daily and recheck in about a month. Call with any concerns or if not improving.

## 2023-04-30 ENCOUNTER — Other Ambulatory Visit: Payer: Self-pay | Admitting: Family Medicine

## 2023-05-01 ENCOUNTER — Telehealth: Payer: Self-pay | Admitting: Family Medicine

## 2023-05-01 NOTE — Telephone Encounter (Signed)
Requested Prescriptions  Pending Prescriptions Disp Refills   simvastatin (ZOCOR) 40 MG tablet [Pharmacy Med Name: SIMVASTATIN 40 MG TAB] 90 tablet 1    Sig: TAKE 1 TABLET BY MOUTH AT BEDTIME FOR CHOLESTEROL     Cardiovascular:  Antilipid - Statins Failed - 04/30/2023  3:54 PM      Failed - Lipid Panel in normal range within the last 12 months    Cholesterol, Total  Date Value Ref Range Status  03/10/2023 147 100 - 199 mg/dL Final   Cholesterol [place], [place]  Date Value Ref Range Status  09/13/2015 CANCELED      Comment:    Test not performed  Result canceled by the ancillary    LDL Chol Calc (NIH)  Date Value Ref Range Status  03/10/2023 38 0 - 99 mg/dL Final   HDL  Date Value Ref Range Status  03/10/2023 35 (L) >39 mg/dL Final   Triglycerides  Date Value Ref Range Status  03/10/2023 523 (H) 0 - 149 mg/dL Final   Triglycerides Piccolo,Waived  Date Value Ref Range Status  09/13/2015 CANCELED      Comment:    Test not performed  Result canceled by the ancillary          Passed - Patient is not pregnant      Passed - Valid encounter within last 12 months    Recent Outpatient Visits           3 weeks ago Depression, major, single episode, moderate (HCC)   [facility] [place], Megan P, DO   1 month ago Depression, major, single episode, moderate (HCC)   [place] [facility] [place], Megan P, DO   3 months ago Acute abdominal pain   [place] [facility] Jackolyn Confer, MD   7 months ago Routine general medical examination at a health care facility   [facility], DO   10 months ago Chronic left shoulder pain   [facility] [place], [place], DO       Future Appointments             In 5 months Laural Benes, Oralia Rud, DO  [facility]   In 5 months Richardo Hanks, Laurette Schimke, MD [facility] [place]              tamsulosin (FLOMAX) 0.4 MG CAPS capsule [Pharmacy Med Name: TAMSULOSIN HCL 0.4 MG CAP] 180 capsule 1    Sig: TAKE 2 CAPSULES BY MOUTH ONCE DAILY FOR PROSTATE/URINE     Urology: Alpha-Adrenergic Blocker Passed - 04/30/2023  3:54 PM      Passed - PSA in normal range and within 360 days    Prostate Specific Ag, Serum  Date Value Ref Range Status  09/08/2022 0.6 0.0 - 4.0 ng/mL Final    Comment:    Roche ECLIA methodology. According to the American Urological Association, Serum PSA should decrease and remain at undetectable levels after radical prostatectomy. The AUA defines biochemical recurrence as an initial PSA value 0.2 ng/mL or greater followed by a subsequent confirmatory PSA value 0.2 ng/mL or greater. Values obtained with different assay methods or kits cannot be used interchangeably. Results cannot be interpreted as absolute evidence of the presence or absence of malignant disease.          Passed - Last BP in normal range    BP Readings from Last 1 Encounters:  04/07/23 135/74         Passed - Valid encounter within last 12 months    Recent Outpatient Visits           3 weeks ago Depression, major, single episode, moderate (HCC)   [place] [facility] [place], Megan P, DO   1 month ago Depression, major, single episode, moderate (HCC)   [place] [facility] [place], [place], DO   3 months ago Acute abdominal pain   [place] [facility] Jackolyn Confer, MD   7 months ago Routine general medical examination at a health care facility   [facility], Megan P, DO   10 months ago Chronic left shoulder pain   [place] [facility] [place], Oralia Rud, DO       Future Appointments             In 5 months Laural Benes, Oralia Rud, DO [facility], [facility]   In 5 months Richardo Hanks, Laurette Schimke, MD [facility] [place]             traZODone (DESYREL) 50 MG tablet  [Pharmacy Med Name: TRAZODONE HCL 50 MG TAB] 180 tablet 1    Sig: TAKE 2 TABLETS BY MOUTH AT BEDTIME AS NEEDED FOR SLEEP     Psychiatry: Antidepressants - Serotonin Modulator Passed - 04/30/2023  3:54 PM      Passed - Completed PHQ-2 or PHQ-9 in the last 360 days      Passed - Valid encounter within last 6 months    Recent Outpatient Visits           3 weeks ago Depression, major, single episode, moderate (HCC)   [place] [facility] [place], Megan P, DO   1 month ago Depression, major, single episode, moderate (HCC)   [facility] [place], Megan P, DO   3 months ago Acute abdominal pain   [place] [facility] Jackolyn Confer, MD   7 months ago Routine general medical examination at a health care facility   [facility], Megan P, DO   10 months ago Chronic left shoulder pain   [place] [facility] [place], [place], DO       Future Appointments             In 5 months Johnson, Oralia Rud, DO  [facility], [facility]   In 5 months [place], Laurette Schimke, MD [facility] [place]             DULoxetine (CYMBALTA) 20 MG capsule [Pharmacy Med Name: DULOXETINE HCL 20 MG CAP] 180 capsule 1    Sig: TAKE 2 CAPSULES BY MOUTH ONCE DAILY FOR MOOD/ CONCENTRATION/ NERVE PAIN     Psychiatry: Antidepressants - SNRI - duloxetine Passed - 04/30/2023  3:54 PM      Passed - Cr in normal range and within 360 days    Creatinine, Ser  Date Value Ref Range Status  03/10/2023 1.20 0.76 - 1.27 mg/dL Final         Passed - eGFR is 30 or above and within 360 days    GFR calc Af Amer  Date Value Ref Range Status  06/29/2020 84 >59 mL/min/1.73 Final    Comment:    **In accordance with recommendations from the NKF-ASN Task force,**   [facility] is in the process of updating its  eGFR calculation to the   2021 CKD-EPI creatinine equation that estimates kidney function   without a race  variable.    GFR, Estimated  Date Value Ref Range Status  01/30/2023 >60 >60 mL/min Final    Comment:    (NOTE) Calculated using the CKD-EPI Creatinine Equation (2021)    eGFR  Date Value Ref Range Status  03/10/2023 68 >59 mL/min/1.73 Final         Passed - Completed PHQ-2 or PHQ-9 in the last 360 days      Passed - Last BP in normal range    BP Readings from Last 1 Encounters:  04/07/23 135/74         Passed - Valid encounter within last 6 months    Recent Outpatient Visits           3 weeks ago Depression, major, single episode, moderate (HCC)   [place] [facility], Megan P, DO   1 month ago Depression, major, single episode, moderate (HCC)   [place] [facility] [place], Megan P, DO   3 months ago Acute abdominal pain   [place] [facility] Jackolyn Confer, MD   7 months ago Routine general medical examination at a health care facility   [facility], [place] P, DO   10 months ago Chronic left shoulder pain   [place] [facility] [place], [place], DO       Future Appointments             In 5 months Laural Benes, Oralia Rud, DO [facility], [facility]   In 5 months Richardo Hanks, Laurette Schimke, MD [facility] [place]             meloxicam (MOBIC) 15 MG tablet [Pharmacy Med Name: MELOXICAM 15 MG TAB] 90 tablet 1    Sig: TAKE 1 TABLET BY MOUTH ONCE DAILY FOR JOINT PAIN     Analgesics:  COX2 Inhibitors Failed - 04/30/2023  3:54 PM      Failed - Manual Review: Labs are only required if the patient has taken medication for more than 8 weeks.      Passed - HGB in normal range and within 360 days    Hemoglobin  Date Value Ref Range Status  03/10/2023 15.4 13.0 - 17.7 g/dL Final         Passed - Cr in normal range and within 360 days    Creatinine, Ser  Date Value Ref Range Status  03/10/2023 1.20 0.76 - 1.27 mg/dL Final         Passed - HCT in  normal range and within 360 days    Hematocrit  Date Value Ref Range Status  03/10/2023 47.0 37.5 - 51.0 % Final         Passed - AST in normal range and within 360 days    AST  Date Value Ref Range Status  03/10/2023 24 0 - 40 IU/L Final   AST (SGOT) Piccolo, Waived  Date Value Ref Range Status  09/13/2015 26 11 - 38 U/L Final         Passed - ALT in normal range and within 360 days    ALT  Date Value Ref Range Status  03/10/2023 38 0 - 44 IU/L Final   ALT (SGPT) Piccolo, Waived  Date Value Ref Range Status  09/13/2015 38 10 - 47 U/L Final  Passed - eGFR is 30 or above and within 360 days    GFR calc Af Amer  Date Value Ref Range Status  06/29/2020 84 >59 mL/min/1.73 Final    Comment:    **In accordance with recommendations from the NKF-ASN Task force,**   [facility] is in the process of updating its eGFR calculation to the   2021 CKD-EPI creatinine equation that estimates kidney function   without a race variable.    GFR, Estimated  Date Value Ref Range Status  01/30/2023 >60 >60 mL/min Final    Comment:    (NOTE) Calculated using the CKD-EPI Creatinine Equation (2021)    eGFR  Date Value Ref Range Status  03/10/2023 68 >59 mL/min/1.73 Final         Passed - Patient is not pregnant      Passed - Valid encounter within last 12 months    Recent Outpatient Visits           3 weeks ago Depression, major, single episode, moderate (HCC)   [place] [facility] [place], Megan P, DO   1 month ago Depression, major, single episode, moderate (HCC)   [place] [facility] [place], Megan P, DO   3 months ago Acute abdominal pain   [place] [facility] Jackolyn Confer, MD   7 months ago Routine general medical examination at a health care facility   [facility], [place] P, DO   10 months ago Chronic left shoulder pain   [place] [facility] [place], Oralia Rud, DO        Future Appointments             In 5 months Laural Benes, Oralia Rud, DO  [facility]   In 5 months Richardo Hanks, Laurette Schimke, MD [facility] [place]

## 2023-05-01 NOTE — Telephone Encounter (Signed)
Copied from CRM [id number]. Topic: General - Call Back - No Documentation >> May 01, 2023 12:33 PM Curtis Bray wrote: Pt would like Laurelyn Sickle to give him a call.  He states Dr Laural Benes told him he was the one to handle his issue.  He did say it was not anything bad.  But declined to elaborate.

## 2023-05-04 NOTE — Telephone Encounter (Signed)
Pt requesting call back from [facility]. Pt advised by Dr. Laural Benes to speak to Laurelyn Sickle in regards to issue. Pt declined to elaborate further.   Pt requesting call back

## 2023-05-15 NOTE — Telephone Encounter (Signed)
[facility] requesting patient to call me back directly at [phone].

## 2023-06-19 ENCOUNTER — Other Ambulatory Visit: Payer: Self-pay | Admitting: Family Medicine

## 2023-06-19 NOTE — Telephone Encounter (Signed)
Requested Prescriptions  Pending Prescriptions Disp Refills   RABEprazole (ACIPHEX) 20 MG tablet [Pharmacy Med Name: RABEPRAZOLE SODIUM 20 MG DR TAB] 180 tablet 1    Sig: TAKE 2 TABLETS BY MOUTH ONCE DAILY FOR ACID REFLUX     Gastroenterology: Proton Pump Inhibitors Passed - 06/19/2023  8:35 AM      Passed - Valid encounter within last 12 months    Recent Outpatient Visits           2 months ago Depression, major, single episode, moderate (HCC)   [facility] [place], Megan P, DO   3 months ago Depression, major, single episode, moderate (HCC)   [place] [facility] [place], Megan P, DO   4 months ago Acute abdominal pain   [place] [facility] Jackolyn Confer, MD   9 months ago Routine general medical examination at a health care facility   [facility] [place], [place] P, DO   1 year ago Chronic left shoulder pain    [facility] [place], Oralia Rud, DO       Future Appointments             In 3 months Laural Benes, Oralia Rud, DO  [facility]   In 4 months Richardo Hanks, Laurette Schimke, MD [facility] [place]

## 2023-08-08 ENCOUNTER — Other Ambulatory Visit: Payer: Self-pay | Admitting: Family Medicine

## 2023-08-10 NOTE — Telephone Encounter (Signed)
 Requested Prescriptions  Pending Prescriptions Disp Refills   traZODone (DESYREL) 50 MG tablet [Pharmacy Med Name: TRAZODONE HCL 50 MG TAB] 180 tablet 0    Sig: TAKE 2 TABLETS BY MOUTH AT BEDTIME AS NEEDED FOR SLEEP     Psychiatry: Antidepressants - Serotonin Modulator Passed - 08/10/2023  4:10 PM      Passed - Completed PHQ-2 or PHQ-9 in the last 360 days      Passed - Valid encounter within last 6 months    Recent Outpatient Visits           4 months ago Depression, major, single episode, moderate (HCC)   [place] [facility] [place], Megan P, DO   5 months ago Depression, major, single episode, moderate (HCC)   [place] [facility] [place], Megan P, DO   6 months ago Acute abdominal pain   [place] [facility] Jackolyn Confer, MD   11 months ago Routine general medical examination at a health care facility   [facility] [place], [place] P, DO   1 year ago Chronic left shoulder pain   [place] [facility] [place], Oralia Rud, DO       Future Appointments             In 1 month Laural Benes, Oralia Rud, DO [facility], [facility]   In 2 months Richardo Hanks, Laurette Schimke, MD [facility] [place]             tamsulosin (FLOMAX) 0.4 MG CAPS capsule [Pharmacy Med Name: TAMSULOSIN HCL 0.4 MG CAP] 180 capsule 0    Sig: TAKE 2 CAPSULES BY MOUTH ONCE DAILY FOR PROSTATE/URINE     Urology: Alpha-Adrenergic Blocker Passed - 08/10/2023  4:10 PM      Passed - PSA in normal range and within 360 days    Prostate Specific Ag, Serum  Date Value Ref Range Status  09/08/2022 0.6 0.0 - 4.0 ng/mL Final    Comment:    Roche ECLIA methodology. According to the American Urological Association, Serum PSA should decrease and remain at undetectable levels after radical prostatectomy. The AUA defines biochemical recurrence as an initial PSA value 0.2 ng/mL or greater followed by a subsequent  confirmatory PSA value 0.2 ng/mL or greater. Values obtained with different assay methods or kits cannot be used interchangeably. Results cannot be interpreted as absolute evidence of the presence or absence of malignant disease.          Passed - Last BP in normal range    BP Readings from Last 1 Encounters:  04/07/23 135/74         Passed - Valid encounter within last 12 months    Recent Outpatient Visits           4 months ago Depression, major, single episode, moderate (HCC)   [facility] [place], Megan P, DO   5 months ago Depression, major, single episode, moderate (HCC)   [facility] [place], Megan P, DO   6 months ago Acute abdominal pain   [place] [facility] [place] Jackolyn Confer, MD   11 months ago Routine general medical examination at a health care facility   [facility] [place], [place] P, DO   1 year ago Chronic left shoulder pain    [facility] Dor[facility] [place], DO       Future Appointments  In 1 month Laural Benes, Megan P, DO [place] [facility], [facility]   In 2 months Sondra Come, MD [facility] [place]             simvastatin (ZOCOR) 40 MG tablet [Pharmacy Med Name: SIMVASTATIN 40 MG TAB] 90 tablet 0    Sig: TAKE 1 TABLET BY MOUTH AT BEDTIME FOR CHOLESTEROL     Cardiovascular:  Antilipid - Statins Failed - 08/10/2023  4:10 PM      Failed - Lipid Panel in normal range within the last 12 months    Cholesterol, Total  Date Value Ref Range Status  03/10/2023 147 100 - 199 mg/dL Final   Cholesterol [place]col[place], [place]  Date Value Ref Range Status  09/13/2015 CANCELED      Comment:    Test not performed  Result canceled by the ancillary    LDL Chol Calc (NIH)  Date Value Ref Range Status  03/10/2023 38 0 - 99 mg/dL Final   HDL  Date Value Ref Range Status  03/10/2023 35 (L) >39 mg/dL Final   Triglycerides   Date Value Ref Range Status  03/10/2023 523 (H) 0 - 149 mg/dL Final   Triglycerides Piccolo,Waived  Date Value Ref Range Status  09/13/2015 CANCELED      Comment:    Test not performed  Result canceled by the ancillary          Passed - Patient is not pregnant      Passed - Valid encounter within last 12 months    Recent Outpatient Visits           4 months ago Depression, major, single episode, moderate (HCC)   [facility] [place], Megan P, DO   5 months ago Depression, major, single episode, moderate (HCC)   [place] [facility] [place], Megan P, DO   6 months ago Acute abdominal pain   [place] [facility] Jackolyn Confer, MD   11 months ago Routine general medical examination at a health care facility   [facility] [place], [place] P, DO   1 year ago Chronic left shoulder pain   [place] [place], Oralia Rud, DO       Future Appointments             In 1 month Johnson, Oralia Rud, DO  [facility]   In 2 months Richardo Hanks, Laurette Schimke, MD [facility] [place]

## 2023-10-05 ENCOUNTER — Ambulatory Visit: Payer: Self-pay | Admitting: Family Medicine

## 2023-10-05 ENCOUNTER — Encounter: Payer: Self-pay | Admitting: Family Medicine

## 2023-10-05 VITALS — BP 138/79 | HR 59 | Ht 70.0 in | Wt 282.8 lb

## 2023-10-05 DIAGNOSIS — F321 Major depressive disorder, single episode, moderate: Secondary | ICD-10-CM | POA: Diagnosis not present

## 2023-10-05 DIAGNOSIS — C61 Malignant neoplasm of prostate: Secondary | ICD-10-CM

## 2023-10-05 DIAGNOSIS — R252 Cramp and spasm: Secondary | ICD-10-CM | POA: Diagnosis not present

## 2023-10-05 DIAGNOSIS — Z0000 Encounter for general adult medical examination without abnormal findings: Secondary | ICD-10-CM | POA: Diagnosis not present

## 2023-10-05 DIAGNOSIS — E782 Mixed hyperlipidemia: Secondary | ICD-10-CM

## 2023-10-05 DIAGNOSIS — K219 Gastro-esophageal reflux disease without esophagitis: Secondary | ICD-10-CM | POA: Diagnosis not present

## 2023-10-05 MED ORDER — RABEPRAZOLE SODIUM 20 MG PO TBEC: 40.0000 mg | DELAYED_RELEASE_TABLET | Freq: Every day | ORAL | 1 refills | Status: DC

## 2023-10-05 MED ORDER — SIMVASTATIN 40 MG PO TABS: 40.0000 mg | ORAL_TABLET | Freq: Every day | ORAL | 1 refills | Status: DC

## 2023-10-05 MED ORDER — MELOXICAM 15 MG PO TABS: 15.0000 mg | ORAL_TABLET | Freq: Every day | ORAL | 1 refills | Status: DC

## 2023-10-05 MED ORDER — TRAZODONE HCL 50 MG PO TABS: 100.0000 mg | ORAL_TABLET | Freq: Every day | ORAL | 1 refills | Status: DC

## 2023-10-05 MED ORDER — TAMSULOSIN HCL 0.4 MG PO CAPS: 0.8000 mg | ORAL_CAPSULE | Freq: Every day | ORAL | 1 refills | Status: DC

## 2023-10-05 NOTE — Assessment & Plan Note (Signed)
 Under good control on current regimen. Continue current regimen. Continue to monitor. Call with any concerns. Refills given.

## 2023-10-05 NOTE — Progress Notes (Signed)
 BP 138/79 (BP Location: Left Arm, Patient Position: Sitting, Cuff Size: Large)   Pulse (!) 59   Ht 5\' 10"  (1.778 m)   Wt 282 lb 12.8 oz (128.3 kg)   SpO2 97%   BMI 40.58 kg/m    Subjective:    Patient ID: Curtis Bray., male    DOB: 1961/05/22, 63 y.o.   MRN: [id number]  HPI: Curtis Bray. is a 63 y.o. male presenting on 10/05/2023 for comprehensive medical examination. Current medical complaints include:  DEPRESSION- stopped all his meds about 6 months ago. Has been feeling fine off of them Mood status: controlled Satisfied with current treatment?: yes Symptom severity: mild  Duration of current treatment : chronic Side effects: no Medication compliance: poor compliance Psychotherapy/counseling: no  Previous psychiatric medications: cymbalta , wellbutrin , abilify  Depressed mood: no Anxious mood: no Anhedonia: no Significant weight loss or gain: no Insomnia: no  Fatigue: yes Feelings of worthlessness or guilt: no Impaired concentration/indecisiveness: no Suicidal ideations: no Hopelessness: no Crying spells: no    10/05/2023    4:00 PM 04/07/2023    3:15 PM 01/29/2023    4:05 PM 09/08/2022    3:16 PM 06/09/2022    3:54 PM  Depression screen PHQ 2/9  Decreased Interest 1 2 2 1  0  Down, Depressed, Hopeless 0 1 0 1 0  PHQ - 2 Score 1 3 2 2  0  Altered sleeping 0 0 0 0 1  Tired, decreased energy 3 2 2 2 1   Change in appetite 1 0 0 2 0  Feeling bad or failure about yourself  0 0 0 0 0  Trouble concentrating 2 2 3 2 1   Moving slowly or fidgety/restless 1 1 1 2 1   Suicidal thoughts 0 0 0 0 0  PHQ-9 Score 8 8 8 10 4   Difficult doing work/chores Somewhat difficult  Not difficult at all  Not difficult at all    HYPERLIPIDEMIA Hyperlipidemia status: fair compliance Satisfied with current treatment?  yes Side effects:  no Medication compliance: fair compliance Past cholesterol meds: simvastatin  Supplements: none Aspirin :  no The ASCVD Risk score (Arnett DK, et  al., 2019) failed to calculate for the following reasons:   Risk score cannot be calculated because patient has a medical history suggesting prior/existing ASCVD Chest pain:  no Coronary artery disease:  yes  He currently lives with: wife Interim Problems from his last visit: no  Depression Screen done today and results listed below:     10/05/2023    4:00 PM 04/07/2023    3:15 PM 01/29/2023    4:05 PM 09/08/2022    3:16 PM 06/09/2022    3:54 PM  Depression screen PHQ 2/9  Decreased Interest 1 2 2 1  0  Down, Depressed, Hopeless 0 1 0 1 0  PHQ - 2 Score 1 3 2 2  0  Altered sleeping 0 0 0 0 1  Tired, decreased energy 3 2 2 2 1   Change in appetite 1 0 0 2 0  Feeling bad or failure about yourself  0 0 0 0 0  Trouble concentrating 2 2 3 2 1   Moving slowly or fidgety/restless 1 1 1 2 1   Suicidal thoughts 0 0 0 0 0  PHQ-9 Score 8 8 8 10 4   Difficult doing work/chores Somewhat difficult  Not difficult at all  Not difficult at all    Past Medical History:  Past Medical History:  Diagnosis Date   Allergy  Cancer [facility])    Prostate   Carpal tunnel syndrome    DDD (degenerative disc disease), cervical    DDD (degenerative disc disease), cervical    Deafness in left ear    Dyspnea    with exertion   Edema    GERD (gastroesophageal reflux disease)    History of kidney stones    passed   Hyperlipidemia    Personal history of colonic polyps    Pinched nerve    upper and lower back   Prostate cancer (HCC)    Sleep apnea    Bi-pap   Sleep apnea    Stroke (HCC)    2013, Blind eye   TIA (transient ischemic attack) 07/2011   Umbilical hernia    Vision impairment    Blindness Right Eye    Surgical History:  Past Surgical History:  Procedure Laterality Date   ANTERIOR CERVICAL DECOMP/DISCECTOMY FUSION N/A 03/30/2020   Procedure: Cervical Four-Five/Cervical Five-Six / Cervical Six-Seven  Anterior cervical decompression/discectomy/fusion;  Surgeon: Manya Sells, MD;  Location: [facility]  OR;  Service: Neurosurgery;  Laterality: N/A;  3C/RM 21   APPENDECTOMY     carpel tunnel Bilateral    COLONOSCOPY  2015   Found 2 beign polyps, repeat 10 years   COLONOSCOPY WITH PROPOFOL  N/A 03/30/2019   Procedure: COLONOSCOPY WITH PROPOFOL ;  Surgeon: Luke Salaam, MD;  Location: [facility] ENDOSCOPY;  Service: Gastroenterology;  Laterality: N/A;   ESOPHAGOGASTRODUODENOSCOPY (EGD) WITH PROPOFOL  N/A 03/30/2019   Procedure: ESOPHAGOGASTRODUODENOSCOPY (EGD) WITH PROPOFOL ;  Surgeon: Luke Salaam, MD;  Location: [facility] ENDOSCOPY;  Service: Gastroenterology;  Laterality: N/A;   POSTERIOR CERVICAL LAMINECTOMY WITH MET- RX Right 05/11/2020   Procedure: RIGHT CERVICAL FOUR-FIVE, CERVICAL FIVE-SIX FORAMINOTOMY WITH METRX;  Surgeon: Manya Sells, MD;  Location: [facility] OR;  Service: Neurosurgery;  Laterality: Right;  3C/RM 21   RADIOACTIVE SEED IMPLANT N/A 12/13/2018   Procedure: RADIOACTIVE SEED IMPLANT/BRACHYTHERAPY IMPLANT;  Surgeon: Lawerence Pressman, MD;  Location: [facility] ORS;  Service: Urology;  Laterality: N/A;   TONSILLECTOMY      Medications:  Current Outpatient Medications on File Prior to Visit  Medication Sig   aspirin  EC (ASPIRIN  LOW DOSE) 81 MG tablet Take 1 tablet (81 mg total) by mouth daily. Swallow whole.   Coenzyme Q10 400 MG CAPS Take 1 capsule (400 mg total) by mouth daily.   Fluocinolone Acetonide 0.01 % OIL Place in ear(s).   fluticasone  (FLONASE ) 50 MCG/ACT nasal spray PLACE 2 SPRAYS INTO BOTH NOSTRILS ONCE DAILY   Multiple Vitamins-Minerals (ZINC  PO) Take by mouth daily.   triamcinolone  ointment (KENALOG ) 0.5 % Apply 1 Application topically 2 (two) times daily.   tadalafil  (CIALIS ) 10 MG tablet Take 1-2 tablets (10-20 mg total) by mouth daily. (Patient not taking: Reported on 10/05/2023)   No current facility-administered medications on file prior to visit.    Allergies:  No Known Allergies  Social History:  Social History   Socioeconomic History   Marital status: Married    Spouse  name: Not on file   Number of children: Not on file   Years of education: Not on file   Highest education level: Bachelor's degree (e.g., BA, AB, BS)  Occupational History   Not on file  Tobacco Use   Smoking status: Former    Current packs/day: 0.00    Types: Cigarettes    Start date: 05/26/1977    Quit date: 05/27/1995    Years since quitting: 28.3    Passive exposure: Past   Smokeless tobacco:  Never  Vaping Use   Vaping status: Never Used  Substance and Sexual Activity   Alcohol use: Yes    Alcohol/week: 2.0 standard drinks of alcohol    Types: 2 Glasses of wine per week    Comment: frequently enough, sometime every day, wine   Drug use: No   Sexual activity: Yes  Other Topics Concern   Not on file  Social History Narrative   Not on file   Social Drivers of Health   Financial Resource Strain: Low Risk  (03/09/2023)   Overall Financial Resource Strain (CARDIA)    Difficulty of Paying Living Expenses: Not very hard  Food Insecurity: No Food Insecurity (03/09/2023)   Hunger Vital Sign    Worried About Running Out of Food in the Last Year: Never true    Ran Out of Food in the Last Year: Never true  Transportation Needs: No Transportation Needs (03/09/2023)   PRAPARE - Administrator, Civil Service (Medical): No    Lack of Transportation (Non-Medical): No  Physical Activity: Insufficiently Active (03/09/2023)   Exercise Vital Sign    Days of Exercise per Week: 1 day    Minutes of Exercise per Session: 20 min  Stress: Stress Concern Present (03/09/2023)   [employer] of Occupational Health - Occupational Stress Questionnaire    Feeling of Stress : To some extent  Social Connections: Socially Integrated (03/09/2023)   Social Connection and Isolation Panel [NHANES]    Frequency of Communication with Friends and Family: Three times a week    Frequency of Social Gatherings with Friends and Family: Once a week    Attends Religious Services: More than 4 times  per year    Active Member of [employer] or Organizations: Yes    Attends Engineer, structural: More than 4 times per year    Marital Status: Married  Catering manager Violence: Not on file   Social History   Tobacco Use  Smoking Status Former   Current packs/day: 0.00   Types: Cigarettes   Start date: 05/26/1977   Quit date: 05/27/1995   Years since quitting: 28.3   Passive exposure: Past  Smokeless Tobacco Never   Social History   Substance and Sexual Activity  Alcohol Use Yes   Alcohol/week: 2.0 standard drinks of alcohol   Types: 2 Glasses of wine per week   Comment: frequently enough, sometime every day, wine    Family History:  Family History  Problem Relation Age of Onset   Hearing loss Father    Hypertension Father    Pneumonia Father    Cancer Maternal Grandmother        Colon   Asthma Son     Past medical history, surgical history, medications, allergies, family history and social history reviewed with patient today and changes made to appropriate areas of the chart.   Review of Systems  Constitutional:  Positive for diaphoresis. Negative for chills, fever, malaise/fatigue and weight loss.  HENT:  Positive for hearing loss. Negative for congestion, ear discharge, ear pain, nosebleeds, sinus pain, sore throat and tinnitus.   Eyes: Negative.   Respiratory:  Positive for shortness of breath. Negative for cough, hemoptysis, sputum production, wheezing and stridor.   Cardiovascular:  Positive for leg swelling. Negative for chest pain, palpitations, orthopnea, claudication and PND.  Gastrointestinal:  Positive for blood in stool (has a hemorrhoid) and diarrhea. Negative for abdominal pain, constipation, heartburn, melena, nausea and vomiting.  Genitourinary: Negative.   Musculoskeletal:  Positive  for joint pain. Negative for back pain, falls, myalgias and neck pain.  Skin: Negative.   Neurological: Negative.   Endo/Heme/Allergies: Negative.    Psychiatric/Behavioral: Negative.     All other ROS negative except what is listed above and in the HPI.      Objective:     BP 138/79 (BP Location: Left Arm, Patient Position: Sitting, Cuff Size: Large)   Pulse (!) 59   Ht 5\' 10"  (1.778 m)   Wt 282 lb 12.8 oz (128.3 kg)   SpO2 97%   BMI 40.58 kg/m   Wt Readings from Last 3 Encounters:  10/05/23 282 lb 12.8 oz (128.3 kg)  04/07/23 274 lb 3.2 oz (124.4 kg)  03/10/23 281 lb 6.4 oz (127.6 kg)    Physical Exam Vitals and nursing note reviewed.  Constitutional:      General: He is not in acute distress.    Appearance: Normal appearance. He is obese. He is not ill-appearing, toxic-appearing or diaphoretic.  HENT:     Head: Normocephalic and atraumatic.     Right Ear: Tympanic membrane, ear canal and external ear normal. There is no impacted cerumen.     Left Ear: Tympanic membrane, ear canal and external ear normal. There is no impacted cerumen.     Nose: Nose normal. No congestion or rhinorrhea.     Mouth/Throat:     Mouth: Mucous membranes are moist.     Pharynx: Oropharynx is clear. No oropharyngeal exudate or posterior oropharyngeal erythema.  Eyes:     General: No scleral icterus.       Right eye: No discharge.        Left eye: No discharge.     Extraocular Movements: Extraocular movements intact.     Conjunctiva/sclera: Conjunctivae normal.     Pupils: Pupils are equal, round, and reactive to light.  Neck:     Vascular: No carotid bruit.  Cardiovascular:     Rate and Rhythm: Normal rate and regular rhythm.     Pulses: Normal pulses.     Heart sounds: No murmur heard.    No friction rub. No gallop.  Pulmonary:     Effort: Pulmonary effort is normal. No respiratory distress.     Breath sounds: Normal breath sounds. No stridor. No wheezing, rhonchi or rales.  Chest:     Chest wall: No tenderness.  Abdominal:     General: Abdomen is flat. Bowel sounds are normal. There is no distension.     Palpations: Abdomen is  soft. There is no mass.     Tenderness: There is no abdominal tenderness. There is no right CVA tenderness, left CVA tenderness, guarding or rebound.     Hernia: No hernia is present.  Genitourinary:    Comments: Genital exam deferred with shared decision making Musculoskeletal:        General: No swelling, tenderness, deformity or signs of injury.     Cervical back: Normal range of motion and neck supple. No rigidity. No muscular tenderness.     Right lower leg: No edema.     Left lower leg: No edema.  Lymphadenopathy:     Cervical: No cervical adenopathy.  Skin:    General: Skin is warm and dry.     Capillary Refill: Capillary refill takes less than 2 seconds.     Coloration: Skin is not jaundiced or pale.     Findings: No bruising, erythema, lesion or rash.  Neurological:     General: No focal deficit present.  Mental Status: He is alert and oriented to person, place, and time.     Cranial Nerves: No cranial nerve deficit.     Sensory: No sensory deficit.     Motor: No weakness.     Coordination: Coordination normal.     Gait: Gait normal.     Deep Tendon Reflexes: Reflexes normal.  Psychiatric:        Mood and Affect: Mood normal.        Behavior: Behavior normal.        Thought Content: Thought content normal.        Judgment: Judgment normal.     Results for orders placed or performed in visit on 03/10/23  CBC with Differential/Platelet   Collection Time: 03/10/23  3:21 PM  Result Value Ref Range   WBC 6.5 3.4 - 10.8 x10E3/uL   RBC 5.17 4.14 - 5.80 x10E6/uL   Hemoglobin 15.4 13.0 - 17.7 g/dL   Hematocrit 96.0 45.4 - 51.0 %   MCV 91 79 - 97 fL   MCH 29.8 26.6 - 33.0 pg   MCHC 32.8 31.5 - 35.7 g/dL   RDW 09.8 11.9 - 14.7 %   Platelets 179 150 - 450 x10E3/uL   Neutrophils 55 Not Estab. %   Lymphs 36 Not Estab. %   Monocytes 6 Not Estab. %   Eos 3 Not Estab. %   Basos 0 Not Estab. %   Neutrophils Absolute 3.5 1.4 - 7.0 x10E3/uL   Lymphocytes Absolute 2.4 0.7  - 3.1 x10E3/uL   Monocytes Absolute 0.4 0.1 - 0.9 x10E3/uL   EOS (ABSOLUTE) 0.2 0.0 - 0.4 x10E3/uL   Basophils Absolute 0.0 0.0 - 0.2 x10E3/uL   Immature Granulocytes 0 Not Estab. %   Immature Grans (Abs) 0.0 0.0 - 0.1 x10E3/uL  Comprehensive metabolic panel   Collection Time: 03/10/23  3:21 PM  Result Value Ref Range   Glucose 124 (H) 70 - 99 mg/dL   BUN 11 8 - 27 mg/dL   Creatinine, Ser 8.29 0.76 - 1.27 mg/dL   eGFR 68 >56 OZ/HYQ/6.57   BUN/Creatinine Ratio 9 (L) 10 - 24   Sodium 139 134 - 144 mmol/L   Potassium 4.2 3.5 - 5.2 mmol/L   Chloride 102 96 - 106 mmol/L   CO2 20 20 - 29 mmol/L   Calcium 9.4 8.6 - 10.2 mg/dL   Total Protein 6.3 6.0 - 8.5 g/dL   Albumin 4.4 3.9 - 4.9 g/dL   Globulin, Total 1.9 1.5 - 4.5 g/dL   Bilirubin Total 0.4 0.0 - 1.2 mg/dL   Alkaline Phosphatase 93 44 - 121 IU/L   AST 24 0 - 40 IU/L   ALT 38 0 - 44 IU/L  Lipid Panel w/o Chol/HDL Ratio   Collection Time: 03/10/23  3:21 PM  Result Value Ref Range   Cholesterol, Total 147 100 - 199 mg/dL   Triglycerides 846 (H) 0 - 149 mg/dL   HDL 35 (L) >96 mg/dL   VLDL Cholesterol Cal 74 (H) 5 - 40 mg/dL   LDL Chol Calc (NIH) 38 0 - 99 mg/dL  E95   Collection Time: 03/10/23  5:16 PM  Result Value Ref Range   Vitamin B-12 >2000 (H) 232 - 1245 pg/mL      Assessment & Plan:   Problem List Items Addressed This Visit       Digestive   GERD (gastroesophageal reflux disease)   Under good control on current regimen. Continue current regimen. Continue to monitor.  Call with any concerns. Refills given.        Relevant Medications   RABEprazole  (ACIPHEX ) 20 MG tablet     Genitourinary   Malignant neoplasm of prostate (HCC)   Rechecking labs today. Await results. Continue to follow with urology.       Relevant Orders   PSA     Other   Hyperlipidemia   Has not been taking his medicine regularly. Rechecking labs today. Await results. Treat as needed.       Relevant Medications   simvastatin  (ZOCOR )  40 MG tablet   Other Relevant Orders   Comprehensive metabolic panel with GFR   Lipid Panel w/o Chol/HDL Ratio   Depression, major, single episode, moderate (HCC)   Stable off medicine. Continue off medicine. Call with any concerns.       Relevant Medications   traZODone  (DESYREL ) 50 MG tablet   Other Relevant Orders   CBC with Differential/Platelet   TSH   Other Visit Diagnoses       Routine general medical examination at a health care facility    -  Primary   Vaccines up to date. Screening labs checked today. Colonoscopy up to date. Continue diet and exercise. Call with any concerns.   Relevant Orders   Comprehensive metabolic panel with GFR   CBC with Differential/Platelet   Lipid Panel w/o Chol/HDL Ratio   PSA   TSH     Leg cramps       Will check labs. Await results. Treat as needed.   Relevant Orders   Magnesium    Phosphorus        Discussed aspirin  prophylaxis for myocardial infarction prevention and decision was made to continue ASA  LABORATORY TESTING:  Health maintenance labs ordered today as discussed above.   The natural history of prostate cancer and ongoing controversy regarding screening and potential treatment outcomes of prostate cancer has been discussed with the patient. The meaning of a false positive PSA and a false negative PSA has been discussed. He indicates understanding of the limitations of this screening test and wishes to proceed with screening PSA testing.   IMMUNIZATIONS:   - Tdap: Tetanus vaccination status reviewed: last tetanus booster within 10 years. - Influenza: Up to date - Pneumovax: Up to date - Prevnar: Not applicable - COVID: Up to date - HPV: Not applicable - Shingrix  vaccine: Up to date  SCREENING: - Colonoscopy: Up to date  Discussed with patient purpose of the colonoscopy is to detect colon cancer at curable precancerous or early stages   PATIENT COUNSELING:    Sexuality: Discussed sexually transmitted diseases,  partner selection, use of condoms, avoidance of unintended pregnancy  and contraceptive alternatives.   Advised to avoid cigarette smoking.  I discussed with the patient that most people either abstain from alcohol or drink within safe limits (<=14/week and <=4 drinks/occasion for males, <=7/weeks and <= 3 drinks/occasion for females) and that the risk for alcohol disorders and other health effects rises proportionally with the number of drinks per week and how often a drinker exceeds daily limits.  Discussed cessation/primary prevention of drug use and availability of treatment for abuse.   Diet: Encouraged to adjust caloric intake to maintain  or achieve ideal body weight, to reduce intake of dietary saturated fat and total fat, to limit sodium intake by avoiding high sodium foods and not adding table salt, and to maintain adequate dietary potassium and calcium preferably from fresh fruits, vegetables, and low-fat dairy products.  stressed the importance of regular exercise  Injury prevention: Discussed safety belts, safety helmets, smoke detector, smoking near bedding or upholstery.   Dental health: Discussed importance of regular tooth brushing, flossing, and dental visits.   Follow up plan: NEXT PREVENTATIVE PHYSICAL DUE IN 1 YEAR. Return in about 6 months (around 04/06/2024).

## 2023-10-05 NOTE — Progress Notes (Signed)
 There were no vitals taken for this visit.   Subjective:    Patient ID: Curtis Blow., male    DOB: [birth year], 63 y.o.   MRN: [id number]  HPI: Curtis Zech. is a 63 y.o. male  No chief complaint on file.   Relevant past medical, surgical, family and social history reviewed and updated as indicated. Interim medical history since our last visit reviewed. Allergies and medications reviewed and updated.  Review of Systems  Per HPI unless specifically indicated above     Objective:     There were no vitals taken for this visit.  Wt Readings from Last 3 Encounters:  04/07/23 274 lb 3.2 oz (124.4 kg)  03/10/23 281 lb 6.4 oz (127.6 kg)  01/30/23 283 lb 15.2 oz (128.8 kg)    Physical Exam  Results for orders placed or performed in visit on 03/10/23  CBC with Differential/Platelet   Collection Time: 03/10/23  3:21 PM  Result Value Ref Range   WBC 6.5 3.4 - 10.8 x10E3/uL   RBC 5.17 4.14 - 5.80 x10E6/uL   Hemoglobin 15.4 13.0 - 17.7 g/dL   Hematocrit 34.7 42.5 - 51.0 %   MCV 91 79 - 97 fL   MCH 29.8 26.6 - 33.0 pg   MCHC 32.8 31.5 - 35.7 g/dL   RDW 95.6 38.7 - 56.4 %   Platelets 179 150 - 450 x10E3/uL   Neutrophils 55 Not Estab. %   Lymphs 36 Not Estab. %   Monocytes 6 Not Estab. %   Eos 3 Not Estab. %   Basos 0 Not Estab. %   Neutrophils Absolute 3.5 1.4 - 7.0 x10E3/uL   Lymphocytes Absolute 2.4 0.7 - 3.1 x10E3/uL   Monocytes Absolute 0.4 0.1 - 0.9 x10E3/uL   EOS (ABSOLUTE) 0.2 0.0 - 0.4 x10E3/uL   Basophils Absolute 0.0 0.0 - 0.2 x10E3/uL   Immature Granulocytes 0 Not Estab. %   Immature Grans (Abs) 0.0 0.0 - 0.1 x10E3/uL  Comprehensive metabolic panel   Collection Time: 03/10/23  3:21 PM  Result Value Ref Range   Glucose 124 (H) 70 - 99 mg/dL   BUN 11 8 - 27 mg/dL   Creatinine, Ser 3.32 0.76 - 1.27 mg/dL   eGFR 68 >95 JO/ACZ/6.60   BUN/Creatinine Ratio 9 (L) 10 - 24   Sodium 139 134 - 144 mmol/L   Potassium 4.2 3.5 - 5.2 mmol/L   Chloride 102 96 -  106 mmol/L   CO2 20 20 - 29 mmol/L   Calcium 9.4 8.6 - 10.2 mg/dL   Total Protein 6.3 6.0 - 8.5 g/dL   Albumin 4.4 3.9 - 4.9 g/dL   Globulin, Total 1.9 1.5 - 4.5 g/dL   Bilirubin Total 0.4 0.0 - 1.2 mg/dL   Alkaline Phosphatase 93 44 - 121 IU/L   AST 24 0 - 40 IU/L   ALT 38 0 - 44 IU/L  Lipid Panel w/o Chol/HDL Ratio   Collection Time: 03/10/23  3:21 PM  Result Value Ref Range   Cholesterol, Total 147 100 - 199 mg/dL   Triglycerides 630 (H) 0 - 149 mg/dL   HDL 35 (L) >16 mg/dL   VLDL Cholesterol Cal 74 (H) 5 - 40 mg/dL   LDL Chol Calc (NIH) 38 0 - 99 mg/dL  W10   Collection Time: 03/10/23  5:16 PM  Result Value Ref Range   Vitamin B-12 >2000 (H) 232 - 1245 pg/mL      Assessment & Plan:  Problem List Items Addressed This Visit       Other   Hyperlipidemia - Primary   Depression, major, single episode, moderate (HCC)     Follow up plan: No follow-ups on file.

## 2023-10-05 NOTE — Assessment & Plan Note (Signed)
 Has not been taking his medicine regularly. Rechecking labs today. Await results. Treat as needed.

## 2023-10-05 NOTE — Assessment & Plan Note (Signed)
 Rechecking labs today. Await results. Continue to follow with urology.

## 2023-10-05 NOTE — Assessment & Plan Note (Signed)
 Stable off medicine. Continue off medicine. Call with any concerns.

## 2023-10-06 ENCOUNTER — Ambulatory Visit: Payer: Self-pay | Admitting: Family Medicine

## 2023-10-06 LAB — CBC WITH DIFFERENTIAL/PLATELET
Basophils Absolute: 0 10*3/uL (ref 0.0–0.2)
Basos: 1 %
EOS (ABSOLUTE): 0.2 10*3/uL (ref 0.0–0.4)
Eos: 3 %
Hematocrit: 47.5 % (ref 37.5–51.0)
Hemoglobin: 16 g/dL (ref 13.0–17.7)
Immature Grans (Abs): 0 10*3/uL (ref 0.0–0.1)
Immature Granulocytes: 0 %
Lymphocytes Absolute: 2.3 10*3/uL (ref 0.7–3.1)
Lymphs: 37 %
MCH: 29.7 pg (ref 26.6–33.0)
MCHC: 33.7 g/dL (ref 31.5–35.7)
MCV: 88 fL (ref 79–97)
Monocytes Absolute: 0.5 10*3/uL (ref 0.1–0.9)
Monocytes: 8 %
Neutrophils Absolute: 3.2 10*3/uL (ref 1.4–7.0)
Neutrophils: 51 %
Platelets: 182 10*3/uL (ref 150–450)
RBC: 5.39 x10E6/uL (ref 4.14–5.80)
RDW: 12.8 % (ref 11.6–15.4)
WBC: 6.2 10*3/uL (ref 3.4–10.8)

## 2023-10-06 LAB — COMPREHENSIVE METABOLIC PANEL WITH GFR
ALT: 24 IU/L (ref 0–44)
AST: 22 IU/L (ref 0–40)
Albumin: 4.2 g/dL (ref 3.9–4.9)
Alkaline Phosphatase: 83 IU/L (ref 44–121)
BUN/Creatinine Ratio: 8 — ABNORMAL LOW (ref 10–24)
BUN: 8 mg/dL (ref 8–27)
Bilirubin Total: 0.6 mg/dL (ref 0.0–1.2)
CO2: 19 mmol/L — ABNORMAL LOW (ref 20–29)
Calcium: 9.2 mg/dL (ref 8.6–10.2)
Chloride: 103 mmol/L (ref 96–106)
Creatinine, Ser: 1.04 mg/dL (ref 0.76–1.27)
Globulin, Total: 2.2 g/dL (ref 1.5–4.5)
Glucose: 137 mg/dL — ABNORMAL HIGH (ref 70–99)
Potassium: 3.7 mmol/L (ref 3.5–5.2)
Sodium: 138 mmol/L (ref 134–144)
Total Protein: 6.4 g/dL (ref 6.0–8.5)
eGFR: 81 mL/min/{1.73_m2} (ref >59)

## 2023-10-06 LAB — LIPID PANEL W/O CHOL/HDL RATIO
Cholesterol, Total: 173 mg/dL (ref 100–199)
HDL: 34 mg/dL — ABNORMAL LOW (ref >39)
LDL Chol Calc (NIH): 89 mg/dL (ref 0–99)
Triglycerides: 303 mg/dL — ABNORMAL HIGH (ref 0–149)
VLDL Cholesterol Cal: 50 mg/dL — ABNORMAL HIGH (ref 5–40)

## 2023-10-06 LAB — MAGNESIUM: Magnesium: 1.9 mg/dL (ref 1.6–2.3)

## 2023-10-06 LAB — TSH: TSH: 1.52 u[IU]/mL (ref 0.450–4.500)

## 2023-10-06 LAB — PSA: Prostate Specific Ag, Serum: 1.6 ng/mL (ref 0.0–4.0)

## 2023-10-06 LAB — PHOSPHORUS: Phosphorus: 2.7 mg/dL — ABNORMAL LOW (ref 2.8–4.1)

## 2023-10-21 ENCOUNTER — Ambulatory Visit: Payer: Self-pay | Admitting: Urology

## 2023-10-21 VITALS — BP 151/88 | HR 74 | Ht 70.0 in | Wt 280.0 lb

## 2023-10-21 DIAGNOSIS — N529 Male erectile dysfunction, unspecified: Secondary | ICD-10-CM

## 2023-10-21 DIAGNOSIS — C61 Malignant neoplasm of prostate: Secondary | ICD-10-CM

## 2023-10-21 MED ORDER — TADALAFIL 10 MG PO TABS: 10.0000 mg | ORAL_TABLET | Freq: Every day | ORAL | 6 refills | Status: DC

## 2023-10-21 NOTE — Progress Notes (Signed)
   10/21/2023 3:44 PM   Curtis Bray. 10/08/1960 [id number]  Reason for visit: Follow up prostate cancer,  ED  HPI: He is a 63 year old male who was found to have an elevated PSA of 6.7 in March 2020 and was found to have high volume high risk prostate cancer with no evidence of metastatic disease. He underwent his first Lupron  injection in May 2020, and underwent brachytherapy on 12/13/2018.    He opted to discontinue ADT after 1 year secondary to severe side effects of worsening depression and apathy.  PSA was undetectable after treatment, and had stabilized at 0.6 in April 2024, which is unchanged from April 2023.  We discussed the challenges in interpreting PSA values after brachytherapy and the concept of a PSA bounce.   Most recent PSA from 10/05/2023 increased to 1.6.  We discussed the importance of a repeat PSA in the next 1 to 2 months, and consideration of recurrence and repeat staging imaging if increased greater than 2, as well as potential referral to medical oncology.  He really is not having any significant urinary symptoms.  Currently taking Flomax  0.8 mg some nights, he is unsure if he sees any improvement.  I think it is reasonable to stop that medication, and can resume if bothersome urinary symptom recurs.  Currently taking Cialis  10 mg every other day, rarely sexually active at this point.  Denies any pain with ejaculations.  Cialis  refilled, sent to [email or web address] Lab visit repeat PSA 4 to 6 weeks, call with results    Lawerence Pressman, MD  [facility] [address], [address] [place], [place] [address] [phone]

## 2023-11-14 ENCOUNTER — Other Ambulatory Visit: Payer: Self-pay | Admitting: Family Medicine

## 2023-12-04 ENCOUNTER — Other Ambulatory Visit
Admission: RE | Admit: 2023-12-04 | Discharge: 2023-12-04 | Disposition: A | Discharge status: Home / Self Care | Attending: Urology | Admitting: Urology

## 2023-12-04 ENCOUNTER — Ambulatory Visit: Payer: Self-pay | Admitting: Urology

## 2023-12-04 DIAGNOSIS — C61 Malignant neoplasm of prostate: Secondary | ICD-10-CM | POA: Insufficient documentation

## 2023-12-04 LAB — PSA: Prostatic Specific Antigen: 1.57 ng/mL (ref 0.00–4.00)

## 2023-12-07 ENCOUNTER — Other Ambulatory Visit: Payer: Self-pay

## 2023-12-07 DIAGNOSIS — C61 Malignant neoplasm of prostate: Secondary | ICD-10-CM

## 2023-12-07 NOTE — Telephone Encounter (Signed)
 Appt scheduled

## 2023-12-30 ENCOUNTER — Other Ambulatory Visit: Payer: Self-pay | Admitting: Family Medicine

## 2024-01-01 NOTE — Telephone Encounter (Signed)
 Requested Prescriptions  Pending Prescriptions Disp Refills   RABEprazole  (ACIPHEX ) 20 MG tablet [Pharmacy Med Name: RABEPRAZOLE  SODIUM 20 MG DR TAB] 180 tablet 1    Sig: TAKE 2 TABLETS BY MOUTH ONCE DAILY FOR ACID REFLUX     Gastroenterology: Proton Pump Inhibitors Passed - 01/01/2024  3:42 PM      Passed - Valid encounter within last 12 months    Recent Outpatient Visits           2 months ago Routine general medical examination at a health care facility   [facility], Megan P, DO               simvastatin  (ZOCOR ) 40 MG tablet [Pharmacy Med Name: SIMVASTATIN  40 MG TAB] 90 tablet 1    Sig: TAKE 1 TABLET BY MOUTH AT BEDTIME FOR CHOLESTEROL     Cardiovascular:  Antilipid - Statins Failed - 01/01/2024  3:42 PM      Failed - Lipid Panel in normal range within the last 12 months    Cholesterol, Total  Date Value Ref Range Status  10/05/2023 173 100 - 199 mg/dL Final   Cholesterol Piccolo, Waived  Date Value Ref Range Status  09/13/2015 CANCELED      Comment:    Test not performed  Result canceled by the ancillary    LDL Chol Calc (NIH)  Date Value Ref Range Status  10/05/2023 89 0 - 99 mg/dL Final   HDL  Date Value Ref Range Status  10/05/2023 34 (L) >39 mg/dL Final   Triglycerides  Date Value Ref Range Status  10/05/2023 303 (H) 0 - 149 mg/dL Final   Triglycerides Piccolo,Waived  Date Value Ref Range Status  09/13/2015 CANCELED      Comment:    Test not performed  Result canceled by the ancillary          Passed - Patient is not pregnant      Passed - Valid encounter within last 12 months    Recent Outpatient Visits           2 months ago Routine general medical examination at a health care facility   [facility], Megan P, DO               aspirin  EC (ASPIRIN  LOW DOSE) 81 MG tablet [Pharmacy Med Name: ASPIRIN  LOW DOSE 81 MG DR TAB] 90 tablet 0    Sig: TAKE 1 TABLET BY MOUTH ONCE  DAILY     Analgesics:  NSAIDS - aspirin  Passed - 01/01/2024  3:42 PM      Passed - Cr in normal range and within 360 days    Creatinine, Ser  Date Value Ref Range Status  10/05/2023 1.04 0.76 - 1.27 mg/dL Final         Passed - eGFR is 10 or above and within 360 days    GFR calc Af Amer  Date Value Ref Range Status  06/29/2020 84 >59 mL/min/1.73 Final    Comment:    **In accordance with recommendations from the NKF-ASN Task force,**   [facility] is in the process of updating its eGFR calculation to the   2021 CKD-EPI creatinine equation that estimates kidney function   without a race variable.    GFR, Estimated  Date Value Ref Range Status  01/30/2023 >60 >60 mL/min Final    Comment:    (NOTE) Calculated using the CKD-EPI Creatinine Equation (2021)    eGFR  Date Value Ref Range Status  10/05/2023 81 >59 mL/min/1.73 Final         Passed - Patient is not pregnant      Passed - Valid encounter within last 12 months    Recent Outpatient Visits           2 months ago Routine general medical examination at a health care facility   [facility], Megan P, DO

## 2024-01-04 DIAGNOSIS — C61 Malignant neoplasm of prostate: Secondary | ICD-10-CM

## 2024-01-04 DIAGNOSIS — N529 Male erectile dysfunction, unspecified: Secondary | ICD-10-CM

## 2024-01-04 MED ORDER — TADALAFIL 10 MG PO TABS: 10.0000 mg | ORAL_TABLET | Freq: Every day | ORAL | 3 refills | Status: AC

## 2024-01-06 ENCOUNTER — Ambulatory Visit: Admitting: Family Medicine

## 2024-02-18 ENCOUNTER — Other Ambulatory Visit: Payer: Self-pay | Admitting: Family Medicine

## 2024-02-19 NOTE — Telephone Encounter (Signed)
 Requested Prescriptions  Refused Prescriptions Disp Refills   traZODone  (DESYREL ) 50 MG tablet [Pharmacy Med Name: TRAZODONE  HCL 50 MG TAB] 180 tablet 1    Sig: TAKE 2 TABLETS BY MOUTH AT BEDTIME AS NEEDED FOR SLEEP     Psychiatry: Antidepressants - Serotonin Modulator Passed - 02/19/2024  2:24 PM      Passed - Completed PHQ-2 or PHQ-9 in the last 360 days      Passed - Valid encounter within last 6 months    Recent Outpatient Visits           4 months ago Routine general medical examination at a health care facility   [facility], Megan P, DO

## 2024-02-25 ENCOUNTER — Ambulatory Visit: Admitting: Family Medicine

## 2024-03-08 ENCOUNTER — Other Ambulatory Visit

## 2024-03-08 DIAGNOSIS — C61 Malignant neoplasm of prostate: Secondary | ICD-10-CM

## 2024-03-09 ENCOUNTER — Encounter: Payer: Self-pay | Admitting: Pediatrics

## 2024-03-09 ENCOUNTER — Ambulatory Visit: Payer: Self-pay | Admitting: Urology

## 2024-03-09 ENCOUNTER — Ambulatory Visit: Admitting: Pediatrics

## 2024-03-09 ENCOUNTER — Other Ambulatory Visit: Payer: Self-pay

## 2024-03-09 VITALS — BP 135/86 | HR 69 | Wt 288.6 lb

## 2024-03-09 DIAGNOSIS — M65342 Trigger finger, left ring finger: Secondary | ICD-10-CM

## 2024-03-09 DIAGNOSIS — R9721 Rising PSA following treatment for malignant neoplasm of prostate: Secondary | ICD-10-CM

## 2024-03-09 DIAGNOSIS — C61 Malignant neoplasm of prostate: Secondary | ICD-10-CM

## 2024-03-09 LAB — PSA: Prostate Specific Ag, Serum: 2 ng/mL (ref 0.0–4.0)

## 2024-03-09 NOTE — Patient Instructions (Signed)
 For Dr. Cleotilde: call  [phone] any time to speak with an [facility] team member.

## 2024-03-09 NOTE — Progress Notes (Signed)
 Office Visit  BP 135/86   Pulse 69   Wt 288 lb 9.6 oz (130.9 kg)   SpO2 95%   BMI 41.41 kg/m    Subjective:    Patient ID: Curtis JONELLE Latisha Mickey., male    DOB: 1960-10-05, 63 y.o.   MRN: [id number]  HPI: Curtis Bray is a 63 y.o. male  Chief Complaint  Patient presents with   Hand Pain    Trigger finger in a couple of fingers     Discussed the use of AI scribe software for clinical note transcription with the patient, who gave verbal consent to proceed.  History of Present Illness   Curtis Bray. is a 63 year old male with Sjogren's syndrome who presents with progressive pain and locking of the ring finger.  He has been experiencing progressive pain and locking of the ring finger for several months. The symptoms are worsening, and he finds temporary relief by moving the finger back and forth. The finger does not lock completely, but there is discomfort when attempting to extend it. Mild symptoms are also present in other fingers.  He has been taking meloxicam  for a long time but has stopped due to fatigue from prolonged use. He is not currently taking any anti-inflammatory medications.  He mentions having Sjogren's syndrome, which is not well controlled, and is dealing with other age-related issues such as hip pain.     Relevant past medical, surgical, family and social history reviewed and updated as indicated. Interim medical history since our last visit reviewed. Allergies and medications reviewed and updated.  ROS per HPI unless specifically indicated above     Objective:    BP 135/86   Pulse 69   Wt 288 lb 9.6 oz (130.9 kg)   SpO2 95%   BMI 41.41 kg/m   Wt Readings from Last 3 Encounters:  03/09/24 288 lb 9.6 oz (130.9 kg)  10/21/23 280 lb (127 kg)  10/05/23 282 lb 12.8 oz (128.3 kg)     Physical Exam Constitutional:      Appearance: Normal appearance.  Pulmonary:     Effort: Pulmonary effort is normal.  Musculoskeletal:     Left hand:  Decreased range of motion.     Comments: Left ring finger locks with flexsion at MCP joint  Skin:    Comments: Normal skin color  Neurological:     General: No focal deficit present.     Mental Status: He is alert. Mental status is at baseline.  Psychiatric:        Mood and Affect: Mood normal.        Behavior: Behavior normal.        Thought Content: Thought content normal.         03/09/2024    8:19 AM 10/05/2023    4:00 PM 04/07/2023    3:15 PM 01/29/2023    4:05 PM 09/08/2022    3:16 PM  Depression screen PHQ 2/9  Decreased Interest 1 1 2 2 1   Down, Depressed, Hopeless 0 0 1 0 1  PHQ - 2 Score 1 1 3 2 2   Altered sleeping 0 0 0 0 0  Tired, decreased energy 3 3 2 2 2   Change in appetite 1 1 0 0 2  Feeling bad or failure about yourself  0 0 0 0 0  Trouble concentrating 2 2 2 3 2   Moving slowly or fidgety/restless 1 1 1 1 2   Suicidal thoughts 0 0  0 0 0  PHQ-9 Score 8 8 8 8 10   Difficult doing work/chores Somewhat difficult Somewhat difficult  Not difficult at all        03/09/2024    8:19 AM 10/05/2023    4:00 PM 04/07/2023    3:15 PM 01/29/2023    4:05 PM  GAD 7 : Generalized Anxiety Score  Nervous, Anxious, on Edge 1 1 1 1   Control/stop worrying 0 0 0 0  Worry too much - different things 0 0 0 0  Trouble relaxing 1 1 1 2   Restless 1 1 1 1   Easily annoyed or irritable 1 1 1 1   Afraid - awful might happen 0 0 0 0  Total GAD 7 Score 4 4 4 5   Anxiety Difficulty Not difficult at all  Somewhat difficult Not difficult at all       Assessment & Plan:  Assessment & Plan   Trigger ring finger of left hand 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 Interested in alternative treatments with Dr. Cleotilde at [facility].  - No supplies to splint today - Offered trial of NSAIDs, medrol , and OT, declined for now but will let us  know after talking to Dr. Cleotilde - Declines sports med referral for now -     Ambulatory referral to  Orthopedic Surgery  Follow up plan: No follow-ups on file.  Hadassah SHAUNNA Nett, MD

## 2024-03-17 ENCOUNTER — Other Ambulatory Visit: Payer: Self-pay | Admitting: Family Medicine

## 2024-03-18 NOTE — Telephone Encounter (Signed)
 Requested medication (s) are due for refill today: Yes  Requested medication (s) are on the active medication list: No  Last refill:  N/A  Future visit scheduled: Yes  Notes to clinic: Meds not on current list, routing to provider for approval     Requested Prescriptions  Pending Prescriptions Disp Refills   DULoxetine  (CYMBALTA ) 20 MG capsule [Pharmacy Med Name: DULOXETINE  HCL 20 MG CAP] 180 capsule     Sig: TAKE 2 CAPSULES BY MOUTH ONCE DAILY FOR MOOD/ CONCENTRATION/ NERVE PAIN     Psychiatry: Antidepressants - SNRI - duloxetine  Passed - 03/18/2024 11:14 PM      Passed - Cr in normal range and within 360 days    Creatinine, Ser  Date Value Ref Range Status  10/05/2023 1.04 0.76 - 1.27 mg/dL Final         Passed - eGFR is 30 or above and within 360 days    GFR calc Af Amer  Date Value Ref Range Status  06/29/2020 84 >59 mL/min/1.73 Final    Comment:    **In accordance with recommendations from the NKF-ASN Task force,**   [facility] is in the process of updating its eGFR calculation to the   2021 CKD-EPI creatinine equation that estimates kidney function   without a race variable.    GFR, Estimated  Date Value Ref Range Status  01/30/2023 >60 >60 mL/min Final    Comment:    (NOTE) Calculated using the CKD-EPI Creatinine Equation (2021)    eGFR  Date Value Ref Range Status  10/05/2023 81 >59 mL/min/1.73 Final         Passed - Completed PHQ-2 or PHQ-9 in the last 360 days      Passed - Last BP in normal range    BP Readings from Last 1 Encounters:  03/09/24 135/86         Passed - Valid encounter within last 6 months    Recent Outpatient Visits           1 week ago Trigger ring finger of left hand   [facility] Herold Hadassah SQUIBB, MD   5 months ago Routine general medical examination at a health care facility   [facility], Megan P, DO               tamsulosin  (FLOMAX ) 0.4 MG CAPS capsule  [Pharmacy Med Name: TAMSULOSIN  HCL 0.4 MG CAP] 180 capsule     Sig: TAKE 2 CAPSULES BY MOUTH ONCE DAILY FOR PROSTATE/URINE     Urology: Alpha-Adrenergic Blocker Passed - 03/18/2024 11:14 PM      Passed - PSA in normal range and within 360 days    Prostate Specific Ag, Serum  Date Value Ref Range Status  03/08/2024 2.0 0.0 - 4.0 ng/mL Final    Comment:    Roche ECLIA methodology. According to the American Urological Association, Serum PSA should decrease and remain at undetectable levels after radical prostatectomy. The AUA defines biochemical recurrence as an initial PSA value 0.2 ng/mL or greater followed by a subsequent confirmatory PSA value 0.2 ng/mL or greater. Values obtained with different assay methods or kits cannot be used interchangeably. Results cannot be interpreted as absolute evidence of the presence or absence of malignant disease.          Passed - Last BP in normal range    BP Readings from Last 1 Encounters:  03/09/24 135/86         Passed -  Valid encounter within last 12 months    Recent Outpatient Visits           1 week ago Trigger ring finger of left hand   [facility] Herold Hadassah SQUIBB, MD   5 months ago Routine general medical examination at a health care facility   [facility], [place] P, DO              Signed Prescriptions Disp Refills   simvastatin  (ZOCOR ) 40 MG tablet 90 tablet 1    Sig: TAKE 1 TABLET BY MOUTH AT BEDTIME FOR CHOLESTEROL     Cardiovascular:  Antilipid - Statins Failed - 03/18/2024 11:14 PM      Failed - Lipid Panel in normal range within the last 12 months    Cholesterol, Total  Date Value Ref Range Status  10/05/2023 173 100 - 199 mg/dL Final   Cholesterol Piccolo, [place]  Date Value Ref Range Status  09/13/2015 CANCELED      Comment:    Test not performed  Result canceled by the ancillary    LDL Chol Calc (NIH)  Date Value Ref Range Status  10/05/2023  89 0 - 99 mg/dL Final   HDL  Date Value Ref Range Status  10/05/2023 34 (L) >39 mg/dL Final   Triglycerides  Date Value Ref Range Status  10/05/2023 303 (H) 0 - 149 mg/dL Final   Triglycerides Piccolo,Waived  Date Value Ref Range Status  09/13/2015 CANCELED      Comment:    Test not performed  Result canceled by the ancillary          Passed - Patient is not pregnant      Passed - Valid encounter within last 12 months    Recent Outpatient Visits           1 week ago Trigger ring finger of left hand   [place] [facility] Herold Hadassah SQUIBB, MD   5 months ago Routine general medical examination at a health care facility   [facility], Megan P, DO               aspirin  EC (ASPIRIN  LOW DOSE) 81 MG tablet 90 tablet 1    Sig: TAKE 1 TABLET BY MOUTH ONCE DAILY     Analgesics:  NSAIDS - aspirin  Passed - 03/18/2024 11:14 PM      Passed - Cr in normal range and within 360 days    Creatinine, Ser  Date Value Ref Range Status  10/05/2023 1.04 0.76 - 1.27 mg/dL Final         Passed - eGFR is 10 or above and within 360 days    GFR calc Af Amer  Date Value Ref Range Status  06/29/2020 84 >59 mL/min/1.73 Final    Comment:    **In accordance with recommendations from the NKF-ASN Task force,**   [facility] is in the process of updating its eGFR calculation to the   2021 CKD-EPI creatinine equation that estimates kidney function   without a race variable.    GFR, Estimated  Date Value Ref Range Status  01/30/2023 >60 >60 mL/min Final    Comment:    (NOTE) Calculated using the CKD-EPI Creatinine Equation (2021)    eGFR  Date Value Ref Range Status  10/05/2023 81 >59 mL/min/1.73 Final         Passed - Patient is not pregnant  Passed - Valid encounter within last 12 months    Recent Outpatient Visits           1 week ago Trigger ring finger of left hand   [facility] Herold Hadassah SQUIBB, MD    5 months ago Routine general medical examination at a health care facility   [facility], [place] P, DO               RABEprazole  (ACIPHEX ) 20 MG tablet 180 tablet 1    Sig: TAKE 2 TABLETS BY MOUTH ONCE DAILY FOR ACID REFLUX     Gastroenterology: Proton Pump Inhibitors Passed - 03/18/2024 11:14 PM      Passed - Valid encounter within last 12 months    Recent Outpatient Visits           1 week ago Trigger ring finger of left hand   [facility] Herold Hadassah SQUIBB, MD   5 months ago Routine general medical examination at a health care facility   [facility], [place] P, DO               traZODone  (DESYREL ) 50 MG tablet 180 tablet 1    Sig: TAKE 2 TABLETS BY MOUTH AT BEDTIME AS NEEDED FOR SLEEP     Psychiatry: Antidepressants - Serotonin Modulator Passed - 03/18/2024 11:14 PM      Passed - Completed PHQ-2 or PHQ-9 in the last 360 days      Passed - Valid encounter within last 6 months    Recent Outpatient Visits           1 week ago Trigger ring finger of left hand   [facility] Herold Hadassah SQUIBB, MD   5 months ago Routine general medical examination at a health care facility   [facility], [place], DO

## 2024-03-21 ENCOUNTER — Ambulatory Visit

## 2024-03-21 NOTE — Telephone Encounter (Signed)
 Neither medication is on the current med list. Is the patient supposed to be taking these?

## 2024-03-25 ENCOUNTER — Other Ambulatory Visit

## 2024-03-28 ENCOUNTER — Ambulatory Visit
Admit: 2024-03-28 | Discharge: 2024-03-28 | Disposition: A | Discharge status: Home / Self Care | Attending: Urology | Admitting: Urology

## 2024-03-28 ENCOUNTER — Ambulatory Visit

## 2024-03-28 DIAGNOSIS — R9721 Rising PSA following treatment for malignant neoplasm of prostate: Secondary | ICD-10-CM

## 2024-03-28 DIAGNOSIS — R911 Solitary pulmonary nodule: Secondary | ICD-10-CM | POA: Diagnosis not present

## 2024-03-28 DIAGNOSIS — R972 Elevated prostate specific antigen [PSA]: Secondary | ICD-10-CM | POA: Diagnosis present

## 2024-03-28 DIAGNOSIS — C61 Malignant neoplasm of prostate: Secondary | ICD-10-CM

## 2024-03-28 MED ORDER — FLOTUFOLASTAT F 18 GALLIUM 296-5846 MBQ/ML IV SOLN
8.4700 | Freq: Once | INTRAVENOUS | Status: AC
  Administered 2024-03-28: 8.47 via INTRAVENOUS
  Filled 2024-03-28: qty 9

## 2024-03-29 NOTE — Telephone Encounter (Signed)
 Patient returned call for xray results from visit 03/28/24 w/ Rosina Poisson

## 2024-03-31 ENCOUNTER — Ambulatory Visit: Payer: Self-pay | Admitting: Urology

## 2024-03-31 ENCOUNTER — Other Ambulatory Visit: Payer: Self-pay | Admitting: Urology

## 2024-03-31 DIAGNOSIS — C61 Malignant neoplasm of prostate: Secondary | ICD-10-CM

## 2024-03-31 NOTE — Progress Notes (Signed)
 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 PSA undetectable after treatment, but recent rise to 1.6 and 2 prompted PSMA PET.  PSMA PET shows recurrence in R seminal vesicle and pelvic nodes.  Referral placed to medical oncology to consider systemic treatment options  Redell Burnet, MD 03/31/2024

## 2024-04-04 ENCOUNTER — Encounter: Payer: Self-pay | Admitting: Internal Medicine

## 2024-04-04 ENCOUNTER — Inpatient Hospital Stay

## 2024-04-04 ENCOUNTER — Inpatient Hospital Stay: Attending: Internal Medicine | Admitting: Internal Medicine

## 2024-04-04 VITALS — BP 126/75 | HR 73 | Temp 98.6°F | Resp 19 | Wt 283.0 lb

## 2024-04-04 DIAGNOSIS — R911 Solitary pulmonary nodule: Secondary | ICD-10-CM | POA: Diagnosis present

## 2024-04-04 DIAGNOSIS — Z7982 Long term (current) use of aspirin: Secondary | ICD-10-CM | POA: Insufficient documentation

## 2024-04-04 DIAGNOSIS — Z87891 Personal history of nicotine dependence: Secondary | ICD-10-CM | POA: Insufficient documentation

## 2024-04-04 DIAGNOSIS — Z803 Family history of malignant neoplasm of breast: Secondary | ICD-10-CM | POA: Diagnosis not present

## 2024-04-04 DIAGNOSIS — C61 Malignant neoplasm of prostate: Secondary | ICD-10-CM | POA: Insufficient documentation

## 2024-04-04 DIAGNOSIS — G4730 Sleep apnea, unspecified: Secondary | ICD-10-CM | POA: Insufficient documentation

## 2024-04-04 DIAGNOSIS — E785 Hyperlipidemia, unspecified: Secondary | ICD-10-CM | POA: Insufficient documentation

## 2024-04-04 DIAGNOSIS — Z79899 Other long term (current) drug therapy: Secondary | ICD-10-CM | POA: Diagnosis not present

## 2024-04-04 NOTE — Assessment & Plan Note (Addendum)
#   NOV 2025 [Dr.Sninski] RECURRENT PROSTATE CANCER- PROSTATE AND PROSTATE BED: Brachytherapy seeds are positioned throughout the prostate gland. There is intense PSMA activity localizing to the right seminal vesicle with SUV max equal to 22.7 on image 160; LYMPH NODES: Several PSMA-avid lymph nodes along the right obturator space. No PSMA avid lymph nodes are identified outside of the pelvis. No evidence of prostate cancer skeletal metastases.    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  Discussed that unfortunately patient's cancer cannot be cured.  All treatments are palliative and not curative; and treatments are in general indefinite [unless limited by toxicity]  Given the low volume disease I think is reasonable to hold off docetaxel chemotherapy.  For patients with low-volume, low-risk disease, we offer the combination of ADT with a novel hormonal agent (eg, enzalutamide, apalutamide, or abiraterone).   Two randomized trials (TITAN and ARCHES) showed benefit over ADT alone for metastatic CSPC, and benefits were seen regardless of the extent of disease burden. Both apalutamide and enzalutamide are now approved for use in this setting.   At next visit we will discuss with pharmacy regarding appropriate oral drug.  For now proceed with Eligard .  In males with low-volume metastatic CSPC who have not previously received definitive local therapy, we suggest the addition of prostate radiotherapy. The addition of prostate radiation improves radiographic PFS, delayed the time until the onset of castration-resistant prostate cancer and reduced the number of serious genitourinary events such as urinary obstruction in males with low-volume disease.  # NOV 2025- incidental: Within the left lung apex, there is a solid nodule measuring 12 mm; An adjacent ground glass nodule measuring 2.5 mm has a central solid component measuring  7 mm. This solid and ground glass nodule complex does not have PSMA activity but is concerning for lung adenocarcinoma.  Will refer to pulmonary for further evaluation.  Discussed with the patient that SBRT is a potential treatment option.  # History of cancers in the family aunt breast cancer; grand mother- breast- 2 boys- and girl.  Would recommend genetic testing at next visit.  Thank you Dr. Francisca MD for allowing me to participate in the care of your pleasant patient. Please do not hesitate to contact me with questions or concerns in the interim.  # DISPOSITION: # No labs today # Eligard  next week # referral to Dr.Dgayli/Dr.Assaker- re: lung nodule-  # follow up in 2 months- MD; 2-3 days prior labs- cbc/cmp; PSA- Dr.B  Addendum: Discussed with Dr. Steven recommend evaluation with Dr. Camelia for consideration of radiation.

## 2024-04-04 NOTE — Progress Notes (Unsigned)
  [facility] OFFICE PROGRESS NOTE  Patient Care Team: Vicci Duwaine SQUIBB, DO as PCP - General (Family Medicine) Vicci Duwaine SQUIBB, DO as Referring Physician (Family Medicine) Dessa Reyes ORN, MD (General Surgery) Maree Jannett POUR, MD (Neurology) Rennie Cindy SAUNDERS, MD as Consulting Physician (Oncology)   Cancer Staging  Malignant neoplasm of prostate [facility]) Staging form: Prostate, AJCC 8th Edition - Clinical: Stage IVA (cT3a, cN1, cM0, Grade Group: 4) - Signed by Rennie Cindy SAUNDERS, MD on 04/05/2024 Gleason score: 8 Histologic grading system: 5 grade system   Latest Reference Range & Units Most Recent 05/28/18 08:46 04/11/19 16:01 04/20/19 14:29 10/05/19 15:04 01/05/20 09:02 06/29/20 12:12 01/25/21 11:37 05/02/21 16:47 09/05/21 15:50 09/08/22 15:05 10/05/23 16:25 12/04/23 08:43 03/08/24 14:52  Prostate Specific Ag, Serum 0.0 - 4.0 ng/mL 2.0 03/08/24 14:52 6.7 (H) <0.1  <0.1  0.1 0.4 0.5 0.6 0.6 1.6  2.0  Prostatic Specific Antigen 0.00 - 4.00 ng/mL 1.57 12/04/23 08:43   0.03  0.02       1.57   (H): Data is abnormally high  Oncology History Overview Note   elevated PSA of 6.7 in March 2020 and was found to have high volume high risk prostate cancer with no evidence of metastatic disease. He underwent his first Lupron  injection in May 2020, and underwent brachytherapy on 12/13/2018.    He opted to discontinue ADT after 1 year secondary to severe side effects of worsening depression and apathy.  PSA was undetectable after treatment, and had stabilized at 0.6 in April 2024, which is unchanged from April 2023.  We discussed the challenges in interpreting PSA values after brachytherapy and the concept of a PSA bounce.    Most recent PSA from 10/05/2023 increased to 1.6.    PROSTATE AND PROSTATE BED: Brachytherapy seeds are positioned throughout the prostate gland. There is intense PSMA activity localizing to the right seminal vesicle with SUV max equal to 22.7 on image 160.    LYMPH NODES: Several PSMA-avid lymph nodes along the right obturator space. For example, an 8 mm node on image 158 with SUV max equal to 40. A right internal iliac node is noted on image 154 with intense PSMA activity. No PSMA avid lymph nodes are identified outside of the pelvis.   BONES: No evidence of prostate cancer skeletal metastases. Anterior cervical fusion is noted.   OTHER PET FINDINGS: Within the left lung apex, there is a solid nodule measuring 12 mm on image 47. An adjacent ground glass nodule measuring 2.5 mm has a central solid component measuring 7 mm. This solid and ground glass nodule complex does not have PSMA activity but is concerning for lung adenocarcinoma. Physiologic activity within the salivary glands, liver, spleen, kidneys, bowel, and urinary bladder.   IMPRESSION: 1. Intense PSMA activity in the right seminal vesicle and multiple right pelvic lymph nodes, consistent with recurrent prostate cancer. 2. No PSMA-avid lymph nodes identified outside the pelvis. 3. No evidence of skeletal metastases. 4. Solid 12 mm left apical lung nodule and adjacent part-solid ground-glass nodule with a 7 mm solid component, suspicious for primary lung adenocarcinoma, without PSMA activity. Recommend fdg PET scan and thoracic oncology Consultatio  # NOV MID 2025- Eligard .    Malignant neoplasm of prostate (HCC)  03/21/2019 Initial Diagnosis   Malignant neoplasm of prostate (HCC)   04/04/2024 Cancer Staging   Staging form: Prostate, AJCC 8th Edition - Clinical: Stage IVA (cT3a, cN1, cM0, Grade Group: 4) - Signed by Rennie Cindy SAUNDERS, MD on  04/05/2024 Gleason score: 8 Histologic grading system: 5 grade system      HISTORY OF PRESENT ILLNESS:   Lynwood JONELLE Latisha Mickey. 63 y.o.  male pleasant patient above history of prostate cancer has been referred to us  by urology y to discuss further evaluation/treatment.  Discussed the use of AI scribe software for clinical note  transcription with the patient, who gave verbal consent to proceed.  History of Present Illness   Magdaleno Lortie. is a 63 year old male with prostate cancer who presents with concern for recurrence. He was referred by urology for concern of prostate cancer recurrence.  In 2020, he was diagnosed with prostate cancer, with an initial PSA level of 6.7. He underwent brachytherapy, which successfully controlled the PSA levels. Recently, his PSA increased from under 1 to about 2, prompting further investigation. Imaging showed abnormalities in the prostate area and multiple pelvic lymph nodes. Additionally, a nodule was found in the left upper lung, measuring about half an inch.  He has a history of antihormone therapy for one year following his initial prostate cancer diagnosis, which he found unpleasant due to side effects such as mood changes and loss of muscle mass. He also underwent seed implantation as part of his treatment.  He has a history of smoking, having quit 29 years ago. He experiences shortness of breath with exertion, attributed to lack of conditioning, and has sleep apnea. He also suffers from sciatica, with numbness in his right leg after standing for 15 minutes, and has a history of degenerative disc disease with previous spinal fusion surgery.  Family history is significant for breast cancer in his paternal grandmother and aunt. He has three children, two sons and a daughter, aged 58, 32, and 31 respectively.      Review of Systems  Constitutional:  Negative for chills, diaphoresis, fever, malaise/fatigue and weight loss.  HENT:  Negative for nosebleeds and sore throat.   Eyes:  Negative for double vision.  Respiratory:  Negative for cough, hemoptysis, sputum production, shortness of breath and wheezing.   Cardiovascular:  Negative for chest pain, palpitations, orthopnea and leg swelling.  Gastrointestinal:  Negative for abdominal pain, blood in stool, constipation, diarrhea,  heartburn, melena, nausea and vomiting.  Genitourinary:  Negative for dysuria, frequency and urgency.  Musculoskeletal:  Negative for back pain and joint pain.  Skin: Negative.  Negative for itching and rash.  Neurological:  Negative for dizziness, tingling, focal weakness, weakness and headaches.  Endo/Heme/Allergies:  Does not bruise/bleed easily.  Psychiatric/Behavioral:  Negative for depression. The patient is not nervous/anxious and does not have insomnia.       PAST MEDICAL HISTORY :  Past Medical History:  Diagnosis Date   Allergy    Cancer [facility])    Prostate   Carpal tunnel syndrome    DDD (degenerative disc disease), cervical    DDD (degenerative disc disease), cervical    Deafness in left ear    Dyspnea    with exertion   Edema    GERD (gastroesophageal reflux disease)    History of kidney stones    passed   Hyperlipidemia    Personal history of colonic polyps    Pinched nerve    upper and lower back   Prostate cancer (HCC)    Sleep apnea    Bi-pap   Sleep apnea    Stroke (HCC)    2013, Blind eye   TIA (transient ischemic attack) 07/2011   Umbilical hernia  Vision impairment    Blindness Right Eye    PAST SURGICAL HISTORY :   Past Surgical History:  Procedure Laterality Date   ANTERIOR CERVICAL DECOMP/DISCECTOMY FUSION N/A 03/30/2020   Procedure: Cervical Four-Five/Cervical Five-Six / Cervical Six-Seven  Anterior cervical decompression/discectomy/fusion;  Surgeon: Unice Pac, MD;  [facility];  Service: Neurosurgery;  Laterality: N/A;  3C/RM 21   APPENDECTOMY     carpel tunnel Bilateral    COLONOSCOPY  2015   Found 2 beign polyps, repeat 10 years   COLONOSCOPY WITH PROPOFOL  N/A 03/30/2019   Procedure: COLONOSCOPY WITH PROPOFOL ;  Surgeon: Therisa Bi, MD;  Location: [facility] ENDOSCOPY;  Service: Gastroenterology;  Laterality: N/A;   ESOPHAGOGASTRODUODENOSCOPY (EGD) WITH PROPOFOL  N/A 03/30/2019   Procedure: ESOPHAGOGASTRODUODENOSCOPY (EGD) WITH PROPOFOL ;   Surgeon: Therisa Bi, MD;  [facility] ENDOSCOPY;  Service: Gastroenterology;  Laterality: N/A;   POSTERIOR CERVICAL LAMINECTOMY WITH MET- RX Right 05/11/2020   Procedure: RIGHT CERVICAL FOUR-FIVE, CERVICAL FIVE-SIX FORAMINOTOMY WITH METRX;  Surgeon: Unice Pac, MD;  Location: [facility] OR;  Service: Neurosurgery;  Laterality: Right;  3C/RM 21   RADIOACTIVE SEED IMPLANT N/A 12/13/2018   Procedure: RADIOACTIVE SEED IMPLANT/BRACHYTHERAPY IMPLANT;  Surgeon: Francisca Redell BROCKS, MD;  Location: [facility] ORS;  Service: Urology;  Laterality: N/A;   TONSILLECTOMY      FAMILY HISTORY :   Family History  Problem Relation Age of Onset   Hearing loss Father    Hypertension Father    Pneumonia Father    Cancer Maternal Grandmother        Colon   Asthma Son     SOCIAL HISTORY:   Social History   Tobacco Use   Smoking status: Former    Current packs/day: 0.00    Types: Cigarettes    Start date: 05/26/1977    Quit date: 05/27/1995    Years since quitting: 28.8    Passive exposure: Past   Smokeless tobacco: Never  Vaping Use   Vaping status: Never Used  Substance Use Topics   Alcohol use: Yes    Alcohol/week: 2.0 standard drinks of alcohol    Types: 2 Glasses of wine per week    Comment: prn   Drug use: No    ALLERGIES:  has no known allergies.  MEDICATIONS:  Current Outpatient Medications  Medication Sig Dispense Refill   aspirin  EC (ASPIRIN  LOW DOSE) 81 MG tablet TAKE 1 TABLET BY MOUTH ONCE DAILY 90 tablet 1   DULoxetine  (CYMBALTA ) 20 MG capsule TAKE 2 CAPSULES BY MOUTH ONCE DAILY FOR MOOD/ CONCENTRATION/ NERVE PAIN 180 capsule 0   meloxicam  (MOBIC ) 15 MG tablet Take 1 tablet (15 mg total) by mouth daily. 90 tablet 1   RABEprazole  (ACIPHEX ) 20 MG tablet TAKE 2 TABLETS BY MOUTH ONCE DAILY FOR ACID REFLUX 180 tablet 1   simvastatin  (ZOCOR ) 40 MG tablet TAKE 1 TABLET BY MOUTH AT BEDTIME FOR CHOLESTEROL 90 tablet 1   tamsulosin  (FLOMAX ) 0.4 MG CAPS capsule TAKE 2 CAPSULES BY MOUTH ONCE DAILY FOR  PROSTATE/URINE 180 capsule 0   traZODone  (DESYREL ) 50 MG tablet TAKE 2 TABLETS BY MOUTH AT BEDTIME AS NEEDED FOR SLEEP 180 tablet 1   Coenzyme Q10 400 MG CAPS Take 1 capsule (400 mg total) by mouth daily. (Patient not taking: Reported on 04/04/2024) 100 capsule 4   Fluocinolone Acetonide 0.01 % OIL Place in ear(s). (Patient not taking: Reported on 04/04/2024)     fluticasone  (FLONASE ) 50 MCG/ACT nasal spray PLACE 2 SPRAYS INTO BOTH NOSTRILS ONCE DAILY (Patient not  taking: Reported on 04/04/2024) 48 g 3   Multiple Vitamins-Minerals (ZINC  PO) Take by mouth daily. (Patient not taking: Reported on 04/04/2024)     tadalafil  (CIALIS ) 10 MG tablet Take 1-2 tablets (10-20 mg total) by mouth daily. (Patient not taking: Reported on 04/04/2024) 90 tablet 3   No current facility-administered medications for this visit.    PHYSICAL EXAMINATION:   BP 126/75   Pulse 73   Temp 98.6 F (37 C)   Resp 19   Wt 283 lb (128.4 kg)   SpO2 98%   BMI 40.61 kg/m   Filed Weights   04/04/24 1403  Weight: 283 lb (128.4 kg)    Physical Exam Vitals and nursing note reviewed.  HENT:     Head: Normocephalic and atraumatic.     Mouth/Throat:     Pharynx: Oropharynx is clear.  Eyes:     Extraocular Movements: Extraocular movements intact.     Pupils: Pupils are equal, round, and reactive to light.  Cardiovascular:     Rate and Rhythm: Normal rate and regular rhythm.  Pulmonary:     Comments: Decreased breath sounds bilaterally.  Abdominal:     Palpations: Abdomen is soft.  Musculoskeletal:        General: Normal range of motion.     Cervical back: Normal range of motion.  Skin:    General: Skin is warm.  Neurological:     General: No focal deficit present.     Mental Status: He is alert and oriented to person, place, and time.  Psychiatric:        Behavior: Behavior normal.        Judgment: Judgment normal.      LABORATORY DATA:  I have reviewed the data as listed    Component Value  Date/Time   NA 138 10/05/2023 1625   K 3.7 10/05/2023 1625   CL 103 10/05/2023 1625   CO2 19 (L) 10/05/2023 1625   GLUCOSE 137 (H) 10/05/2023 1625   GLUCOSE 116 (H) 01/30/2023 1544   BUN 8 10/05/2023 1625   CREATININE 1.04 10/05/2023 1625   CALCIUM 9.2 10/05/2023 1625   PROT 6.4 10/05/2023 1625   ALBUMIN 4.2 10/05/2023 1625   AST 22 10/05/2023 1625   AST 26 09/13/2015 1549   ALT 24 10/05/2023 1625   ALT 38 09/13/2015 1549   ALKPHOS 83 10/05/2023 1625   BILITOT 0.6 10/05/2023 1625   GFRNONAA >60 01/30/2023 1544   GFRAA 84 06/29/2020 1212    No results found for: SPEP, UPEP  Lab Results  Component Value Date   WBC 6.2 10/05/2023   NEUTROABS 3.2 10/05/2023   HGB 16.0 10/05/2023   HCT 47.5 10/05/2023   MCV 88 10/05/2023   PLT 182 10/05/2023      Chemistry      Component Value Date/Time   NA 138 10/05/2023 1625   K 3.7 10/05/2023 1625   CL 103 10/05/2023 1625   CO2 19 (L) 10/05/2023 1625   BUN 8 10/05/2023 1625   CREATININE 1.04 10/05/2023 1625      Component Value Date/Time   CALCIUM 9.2 10/05/2023 1625   ALKPHOS 83 10/05/2023 1625   AST 22 10/05/2023 1625   AST 26 09/13/2015 1549   ALT 24 10/05/2023 1625   ALT 38 09/13/2015 1549   BILITOT 0.6 10/05/2023 1625       RADIOGRAPHIC STUDIES: I have personally reviewed the radiological images as listed and agreed with the findings in the report. No results  found.   ASSESSMENT & PLAN:  Malignant neoplasm of prostate (HCC) # NOV 2025 [Dr.Sninski] RECURRENT PROSTATE CANCER- PROSTATE AND PROSTATE BED: Brachytherapy seeds are positioned throughout the prostate gland. There is intense PSMA activity localizing to the right seminal vesicle with SUV max equal to 22.7 on image 160; LYMPH NODES: Several PSMA-avid lymph nodes along the right obturator space. No PSMA avid lymph nodes are identified outside of the pelvis. No evidence of prostate cancer skeletal metastases.    # LOW VOLUME DISEASE CASTRATE SENSITIVE  PROSTATE CANCER: I reviewed the natural history of patient's prostate cancer in detail.  Discussed that unfortunately patient's cancer cannot be cured.  All treatments are palliative and not curative; and treatments are in general indefinite [unless limited by toxicity]  Given the low volume disease I think is reasonable to hold off docetaxel chemotherapy.  For patients with low-volume, low-risk disease, we offer the combination of ADT with a novel hormonal agent (eg, enzalutamide, apalutamide, or abiraterone).   Two randomized trials (TITAN and ARCHES) showed benefit over ADT alone for metastatic CSPC, and benefits were seen regardless of the extent of disease burden. Both apalutamide and enzalutamide are now approved for use in this setting.   At next visit we will discuss with pharmacy regarding appropriate oral drug.  For now proceed with Eligard .  In males with low-volume metastatic CSPC who have not previously received definitive local therapy, we suggest the addition of prostate radiotherapy. The addition of prostate radiation improves radiographic PFS, delayed the time until the onset of castration-resistant prostate cancer and reduced the number of serious genitourinary events such as urinary obstruction in males with low-volume disease.  # NOV 2025- incidental: Within the left lung apex, there is a solid nodule measuring 12 mm; An adjacent ground glass nodule measuring 2.5 mm has a central solid component measuring 7 mm. This solid and ground glass nodule complex does not have PSMA activity but is concerning for lung adenocarcinoma.  Will refer to pulmonary for further evaluation.  Discussed with the patient that SBRT is a potential treatment option.  # History of cancers in the family aunt breast cancer; grand mother- breast- 2 boys- and girl.  Would recommend genetic testing at next visit.  Thank you Dr. Francisca MD for allowing me to participate in the care of your pleasant patient. Please do  not hesitate to contact me with questions or concerns in the interim.  # DISPOSITION: # No labs today # Eligard  next week # referral to Dr.Dgayli/Dr.Assaker- re: lung nodule-  # follow up in 2 months- MD; 2-3 days prior labs- cbc/cmp; PSA- Dr.B  Addendum: Discussed with Dr. Steven recommend evaluation with Dr. Camelia for consideration of radiation.    Orders Placed This Encounter  Procedures   CBC with Differential (Cancer Center Only)    Standing Status:   Standing    Number of Occurrences:   1    Expiration Date:   04/04/2025   CMP (Cancer Center only)    Standing Status:   Standing    Number of Occurrences:   1    Expiration Date:   04/04/2025   PSA    Standing Status:   Standing    Number of Occurrences:   1    Expiration Date:   04/04/2025   Ambulatory referral to Pulmonology    Referral Priority:   Routine    Referral Type:   Consultation    Referral Reason:   Specialty Services Required    Requested  Specialty:   Pulmonary Disease    Number of Visits Requested:   1   All questions were answered. The patient knows to call the clinic with any problems, questions or concerns.      Cindy JONELLE Joe, MD 04/05/2024 9:06 PM

## 2024-04-05 ENCOUNTER — Encounter: Payer: Self-pay | Admitting: Internal Medicine

## 2024-04-06 ENCOUNTER — Ambulatory Visit: Admitting: Pulmonary Disease

## 2024-04-06 ENCOUNTER — Encounter: Payer: Self-pay | Admitting: Pulmonary Disease

## 2024-04-06 ENCOUNTER — Telehealth: Payer: Self-pay | Admitting: Internal Medicine

## 2024-04-06 VITALS — BP 122/70 | HR 56 | Temp 98.4°F | Ht 70.0 in | Wt 283.6 lb

## 2024-04-06 DIAGNOSIS — R0602 Shortness of breath: Secondary | ICD-10-CM | POA: Diagnosis not present

## 2024-04-06 DIAGNOSIS — R911 Solitary pulmonary nodule: Secondary | ICD-10-CM

## 2024-04-06 MED ORDER — ALBUTEROL SULFATE HFA 108 (90 BASE) MCG/ACT IN AERS: 2.0000 | INHALATION_SPRAY | Freq: Four times a day (QID) | RESPIRATORY_TRACT | 6 refills | Status: AC | PRN

## 2024-04-06 NOTE — Telephone Encounter (Unsigned)
 Discussed with Dr. Malka; Dr. Crystal-await PET scan if positive proceed with radiation; hold of biopsy.  With regards to prostate-awaiting referral with Dr. Camelia next week.  Patient will follow-up in January-consideration of androgen receptor blocker at that time.  GB

## 2024-04-06 NOTE — Progress Notes (Signed)
 Synopsis: Referred in by Curtis Bray, *   Subjective:   PATIENT ID: Curtis Bray Curtis Bray. GENDER: male DOB: December 14, 1960, MRN: [id number]  Chief Complaint  Patient presents with   Lung Mass    DOE. No wheezing or cough.     HPI Discussed the use of AI scribe software for clinical note transcription with the patient, who gave verbal consent to proceed.  History of Present Illness   Curtis Bray is a pleasant 63 years old male patient with a past medical history of Prostate cancer with recent recurrence presenting today to the pulmonary clinic for further evaluation of a left upper lobe heterogenous nodule.   The latter was recently found on a PET scan done on 03/28/2024 PSMA. It measures 2.5 cm with a solid component of 7mm. There is also an adjacent 12mm solid nodule. This did not show any activity with PSMA PET scan.   Reviewing his prior scans in 2020, his PET scan did demonstrat the GG nodule measuring 19mm at the time.   They have a history of smoking, having quit 29 years ago after smoking one pack per day for 16 years. They experience occasional cough with sputum production and shortness of breath upon exertion, such as climbing stairs, which they attribute to decreased physical activity following hormone therapy for a previous cancer. This therapy reportedly led to a loss of energy and muscle mass.  There is no known family history of lung diseases, although their father and grandfather were smokers, leading to significant secondhand smoke exposure during their childhood.  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      Family History  Problem Relation Age of Onset   Hearing loss Father    Hypertension Father    Pneumonia Father    Cancer Maternal Grandmother        Colon   Asthma Son      Social History   Socioeconomic History   Marital status: Married    Spouse name: Not on file   Number of children: Not on  file   Years of education: Not on file   Highest education level: Bachelor's degree (e.g., BA, AB, BS)  Occupational History   Not on file  Tobacco Use   Smoking status: Former    Current packs/day: 0.00    Types: Cigarettes    Start date: 05/26/1977    Quit date: 05/27/1995    Years since quitting: 28.8    Passive exposure: Past   Smokeless tobacco: Never  Vaping Use   Vaping status: Never Used  Substance and Sexual Activity   Alcohol use: Yes    Alcohol/week: 2.0 standard drinks of alcohol    Types: 2 Glasses of wine per week    Comment: prn   Drug use: No   Sexual activity: Yes  Other Topics Concern   Not on file  Social History Narrative   Not on file   Social Drivers of Health   Financial Resource Strain: Low Risk  (03/28/2024)   [facility] from [facility]   Overall Financial Resource Strain (CARDIA)    Difficulty of Paying Living Expenses: Not hard at all  Food Insecurity: No Food Insecurity (04/04/2024)   Hunger Vital Sign    Worried About Running Out of Food in the Last Year: Never true    Ran Out of Food in the Last Year: Never true  Transportation Needs: No Transportation Needs (  04/04/2024)   PRAPARE - Administrator, Civil Service (Medical): No    Lack of Transportation (Non-Medical): No  Physical Activity: Insufficiently Active (03/09/2023)   Exercise Vital Sign    Days of Exercise per Week: 1 day    Minutes of Exercise per Session: 20 min  Stress: Stress Concern Present (03/09/2023)   [employer] of Occupational Health - Occupational Stress Questionnaire    Feeling of Stress : To some extent  Social Connections: Socially Integrated (03/09/2023)   Social Connection and Isolation Panel    Frequency of Communication with Friends and Family: Three times a week    Frequency of Social Gatherings with Friends and Family: Once a week    Attends Religious Services: More than 4 times per year    Active Member of [employer] or  Organizations: Yes    Attends Engineer, Structural: More than 4 times per year    Marital Status: Married  Catering Manager Violence: Not At Risk (04/04/2024)   Humiliation, Afraid, Rape, and Kick questionnaire    Fear of Current or Ex-Partner: No    Emotionally Abused: No    Physically Abused: No    Sexually Abused: No        Objective:   Vitals:   04/06/24 1122  BP: 122/70  Pulse: (!) 56  Temp: 98.4 F (36.9 C)  SpO2: 97%  Weight: 283 lb 9.6 oz (128.6 kg)  Height: 5' 10 (1.778 m)   97% on RA BMI Readings from Last 3 Encounters:  04/06/24 40.69 kg/m  04/04/24 40.61 kg/m  03/09/24 41.41 kg/m   Wt Readings from Last 3 Encounters:  04/06/24 283 lb 9.6 oz (128.6 kg)  04/04/24 283 lb (128.4 kg)  03/09/24 288 lb 9.6 oz (130.9 kg)    Physical Exam GEN: NAD, Healthy Appearing HEENT: Supple Neck, Reactive Pupils, EOMI  CVS: Normal S1, Normal S2, RRR, No murmurs or ES appreciated  Lungs: Clear bilateral air entry.  Abdomen: Soft, non tender, non distended, + BS  Extremities: Warm and well perfused, No edema  Skin: No suspicious lesions appreciated  Psych: Normal Affect  Labs and imaging reviewed.  Ancillary Information   CBC    Component Value Date/Time   WBC 6.2 10/05/2023 1625   WBC 5.5 01/30/2023 1544   RBC 5.39 10/05/2023 1625   RBC 5.02 01/30/2023 1544   HGB 16.0 10/05/2023 1625   HCT 47.5 10/05/2023 1625   PLT 182 10/05/2023 1625   MCV 88 10/05/2023 1625   MCH 29.7 10/05/2023 1625   MCH 30.3 01/30/2023 1544   MCHC 33.7 10/05/2023 1625   MCHC 33.3 01/30/2023 1544   RDW 12.8 10/05/2023 1625   LYMPHSABS 2.3 10/05/2023 1625   EOSABS 0.2 10/05/2023 1625   BASOSABS 0.0 10/05/2023 1625        No data to display           Assessment & Plan:   #Left upper lobe lung nodule with solid component, suspicious for adenocarcinoma Nodule in left lung apex, 2.5 cm with 7.3 mm solid component, ground glass appearance, likely primary  adenocarcinoma. PET scan negative for prostate cancer metastasis. - Ordered PET scan to evaluate nodule and other areas. - Consider biopsy with robotic-assisted navigation bronchoscopy if PET inconclusive. - Discussed potential direct radiation therapy with Dr. Camelia if FDG Avid on PET  #Shortness of breath on exertion Likely due to decreased activity and possible lung pathology. Occasional cough with sputum.History of smoking, possible COPD contributing to  exertional dyspnea. - Prescribed albuterol inhaler, two puffs every six hours as needed. - Ordered pulmonary function test to assess lung function and COPD.      Return in about 3 months (around 07/07/2024).  I personally spent a total of 60 minutes in the care of the patient today including preparing to see the patient, getting/reviewing separately obtained history, performing a medically appropriate exam/evaluation, counseling and educating, placing orders, documenting clinical information in the EHR, independently interpreting results, and communicating results.   Darrin Barn, MD [facility] 04/06/2024 12:30 PM

## 2024-04-07 ENCOUNTER — Encounter: Payer: Self-pay | Admitting: Family Medicine

## 2024-04-07 ENCOUNTER — Ambulatory Visit: Admitting: Family Medicine

## 2024-04-07 VITALS — BP 137/77 | HR 60 | Temp 97.9°F | Ht 70.0 in | Wt 283.8 lb

## 2024-04-07 DIAGNOSIS — C61 Malignant neoplasm of prostate: Secondary | ICD-10-CM | POA: Diagnosis not present

## 2024-04-07 DIAGNOSIS — F321 Major depressive disorder, single episode, moderate: Secondary | ICD-10-CM | POA: Diagnosis not present

## 2024-04-07 DIAGNOSIS — E782 Mixed hyperlipidemia: Secondary | ICD-10-CM | POA: Diagnosis not present

## 2024-04-07 MED ORDER — TAMSULOSIN HCL 0.4 MG PO CAPS: 0.8000 mg | ORAL_CAPSULE | Freq: Every day | ORAL | 1 refills | Status: AC

## 2024-04-07 MED ORDER — SIMVASTATIN 40 MG PO TABS: 40.0000 mg | ORAL_TABLET | Freq: Every day | ORAL | 1 refills | Status: AC

## 2024-04-07 MED ORDER — MELOXICAM 15 MG PO TABS: 15.0000 mg | ORAL_TABLET | Freq: Every day | ORAL | 1 refills | Status: AC

## 2024-04-07 MED ORDER — RABEPRAZOLE SODIUM 20 MG PO TBEC: 40.0000 mg | DELAYED_RELEASE_TABLET | Freq: Every day | ORAL | 1 refills | Status: AC

## 2024-04-07 MED ORDER — TRAZODONE HCL 50 MG PO TABS: 100.0000 mg | ORAL_TABLET | Freq: Every evening | ORAL | 1 refills | Status: AC | PRN

## 2024-04-07 MED ORDER — DULOXETINE HCL 20 MG PO CPEP: 40.0000 mg | ORAL_CAPSULE | Freq: Every day | ORAL | 1 refills | Status: AC

## 2024-04-07 NOTE — Patient Instructions (Signed)
 [address] [place], [place], [place] [address] Phone: [phone]

## 2024-04-07 NOTE — Progress Notes (Signed)
 BP 137/77   Pulse 60   Temp 97.9 F (36.6 C) (Oral)   Ht 5' 10 (1.778 m)   Wt 283 lb 12.8 oz (128.7 kg)   SpO2 96%   BMI 40.72 kg/m    Subjective:    Patient ID: Curtis Bray., male    DOB: 11/11/1960, 63 y.o.   MRN: [id number]  HPI: Curtis Bray is a 63 y.o. male  Chief Complaint  Patient presents with   Prostate Cancer   Lung Cancer   HYPERLIPIDEMIA Hyperlipidemia status: excellent compliance Satisfied with current treatment?  yes Side effects:  no Medication compliance: excellent compliance Past cholesterol meds: simvastatin  Supplements: none Aspirin :  yes The ASCVD Risk score (Arnett DK, et al., 2019) failed to calculate for the following reasons:   Risk score cannot be calculated because patient has a medical history suggesting prior/existing ASCVD Chest pain:  no Coronary artery disease:  no Family history CAD:  yes  GERD GERD control status: controlled Satisfied with current treatment? yes Heartburn frequency: occasionally Medication side effects: no  Medication compliance: excellent Dysphagia: no Odynophagia:  no Hematemesis: no Blood in stool: no EGD: no  DEPRESSION Mood status: controlled Satisfied with current treatment?: yes Symptom severity: mild  Duration of current treatment : chronic Side effects: no Medication compliance: excellent compliance Psychotherapy/counseling: no  Previous psychiatric medications: cymbalta  Depressed mood: no Anxious mood: no Anhedonia: no Significant weight loss or gain: no Insomnia: no  Fatigue: yes Feelings of worthlessness or guilt: no Impaired concentration/indecisiveness: no Suicidal ideations: no Hopelessness: no Crying spells: no    04/14/2024   11:06 AM 04/04/2024    2:13 PM 03/09/2024    8:19 AM 10/05/2023    4:00 PM 04/07/2023    3:15 PM  Depression screen PHQ 2/9  Decreased Interest 0 0 1 1 2   Down, Depressed, Hopeless 0 0 0 0 1  PHQ - 2 Score 0 0 1 1 3   Altered sleeping    0 0 0  Tired, decreased energy   3 3 2   Change in appetite   1 1 0  Feeling bad or failure about yourself    0 0 0  Trouble concentrating   2 2 2   Moving slowly or fidgety/restless   1 1 1   Suicidal thoughts   0 0 0  PHQ-9 Score   8  8  8    Difficult doing work/chores   Somewhat difficult Somewhat difficult      Data saved with a previous flowsheet row definition     Relevant past medical, surgical, family and social history reviewed and updated as indicated. Interim medical history since our last visit reviewed. Allergies and medications reviewed and updated.  Review of Systems  Constitutional: Negative.   Respiratory: Negative.    Cardiovascular: Negative.   Gastrointestinal: Negative.   Musculoskeletal: Negative.   Neurological: Negative.   Psychiatric/Behavioral: Negative.      Per HPI unless specifically indicated above     Objective:    BP 137/77   Pulse 60   Temp 97.9 F (36.6 C) (Oral)   Ht 5' 10 (1.778 m)   Wt 283 lb 12.8 oz (128.7 kg)   SpO2 96%   BMI 40.72 kg/m   Wt Readings from Last 3 Encounters:  04/14/24 283 lb 1.6 oz (128.4 kg)  04/07/24 283 lb 12.8 oz (128.7 kg)  04/06/24 283 lb 9.6 oz (128.6 kg)    Physical Exam Vitals and nursing note reviewed.  Constitutional:      General: He is not in acute distress.    Appearance: Normal appearance. He is obese. He is not ill-appearing, toxic-appearing or diaphoretic.  HENT:     Head: Normocephalic and atraumatic.     Right Ear: External ear normal.     Left Ear: External ear normal.     Nose: Nose normal.     Mouth/Throat:     Mouth: Mucous membranes are moist.     Pharynx: Oropharynx is clear.  Eyes:     General: No scleral icterus.       Right eye: No discharge.        Left eye: No discharge.     Extraocular Movements: Extraocular movements intact.     Conjunctiva/sclera: Conjunctivae normal.     Pupils: Pupils are equal, round, and reactive to light.  Cardiovascular:     Rate and Rhythm:  Normal rate and regular rhythm.     Pulses: Normal pulses.     Heart sounds: Normal heart sounds. No murmur heard.    No friction rub. No gallop.  Pulmonary:     Effort: Pulmonary effort is normal. No respiratory distress.     Breath sounds: Normal breath sounds. No stridor. No wheezing, rhonchi or rales.  Chest:     Chest wall: No tenderness.  Musculoskeletal:        General: Normal range of motion.     Cervical back: Normal range of motion and neck supple.  Skin:    General: Skin is warm and dry.     Capillary Refill: Capillary refill takes less than 2 seconds.     Coloration: Skin is not jaundiced or pale.     Findings: No bruising, erythema, lesion or rash.  Neurological:     General: No focal deficit present.     Mental Status: He is alert and oriented to person, place, and time. Mental status is at baseline.  Psychiatric:        Mood and Affect: Mood normal.        Behavior: Behavior normal.        Thought Content: Thought content normal.        Judgment: Judgment normal.     Results for orders placed or performed in visit on 03/08/24  PSA   Collection Time: 03/08/24  2:52 PM  Result Value Ref Range   Prostate Specific Ag, Serum 2.0 0.0 - 4.0 ng/mL      Assessment & Plan:   Problem List Items Addressed This Visit       Genitourinary   Malignant neoplasm of prostate (HCC) - Primary   Continue to follow with oncology and urology. Call with any concerns. Continue to monitor.         Other   Hyperlipidemia   Under good control on current regimen. Continue current regimen. Continue to monitor. Call with any concerns. Refills given. Labs drawn today.        Relevant Medications   simvastatin  (ZOCOR ) 40 MG tablet   Depression, major, single episode, moderate (HCC)   Under good control on current regimen. Continue current regimen. Continue to monitor. Call with any concerns. Refills given. Labs drawn today.       Relevant Medications   DULoxetine  (CYMBALTA ) 20  MG capsule   traZODone  (DESYREL ) 50 MG tablet     Follow up plan: Return in about 6 months (around 10/05/2024) for physical.

## 2024-04-11 ENCOUNTER — Ambulatory Visit
Admission: RE | Admit: 2024-04-11 | Discharge: 2024-04-11 | Disposition: A | Discharge status: Home / Self Care | Source: Ambulatory Visit | Attending: Pulmonary Disease | Admitting: Pulmonary Disease

## 2024-04-11 DIAGNOSIS — R918 Other nonspecific abnormal finding of lung field: Secondary | ICD-10-CM | POA: Diagnosis not present

## 2024-04-11 DIAGNOSIS — R911 Solitary pulmonary nodule: Secondary | ICD-10-CM | POA: Diagnosis present

## 2024-04-11 LAB — GLUCOSE, CAPILLARY: Glucose-Capillary: 107 mg/dL — ABNORMAL HIGH (ref 70–99)

## 2024-04-11 MED ORDER — FLUDEOXYGLUCOSE F - 18 (FDG) INJECTION
12.6300 | Freq: Once | INTRAVENOUS | Status: AC | PRN
  Administered 2024-04-11: 12.63 via INTRAVENOUS

## 2024-04-13 ENCOUNTER — Inpatient Hospital Stay

## 2024-04-13 DIAGNOSIS — C61 Malignant neoplasm of prostate: Secondary | ICD-10-CM

## 2024-04-13 MED ORDER — LEUPROLIDE ACETATE (6 MONTH) 45 MG ~~LOC~~ KIT
45.0000 mg | PACK | Freq: Once | SUBCUTANEOUS | Status: AC
  Administered 2024-04-13: 45 mg via SUBCUTANEOUS
  Filled 2024-04-13: qty 45

## 2024-04-14 ENCOUNTER — Ambulatory Visit
Admission: RE | Admit: 2024-04-14 | Discharge: 2024-04-14 | Disposition: A | Discharge status: Home / Self Care | Source: Ambulatory Visit | Attending: Radiation Oncology | Admitting: Radiation Oncology

## 2024-04-14 VITALS — Resp 18 | Ht 70.0 in | Wt 283.1 lb

## 2024-04-14 DIAGNOSIS — C61 Malignant neoplasm of prostate: Secondary | ICD-10-CM

## 2024-04-14 NOTE — Progress Notes (Signed)
 Radiation Oncology Follow up Note  Name: Curtis Bray.   Date:   04/14/2024 MRN:  [id number] DOB: 1961-02-01    This 63 y.o. 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  REFERRING PROVIDER: Vicci Duwaine SQUIBB, DO  HPI: Patient is a 63 year old male well-known to our apartment.  He is at 5 years from I-125 interstitial implant for Gleason 7 adenocarcinoma the prostate.SABRA  He discontinued ADT therapy with Lupron  in April 24.  His PSA has steadily climbed since 1 year ago now up to 2.72-month prior.  He is asymptomatic specifically denies any increased lower Neri tract symptoms diarrhea or fatigue.  He had a PSMA PET scan showing activity in the right seminal vesicle and multiple right pelvic lymph nodes consistent with recurrent prostate cancer.  No avid skeletal lesions were noted also incidentally noted was a 12 mm left apical nodule with a 7 mm solid component suspicious for primary lung adenocarcinoma.  He did have a dedicated PET scan which did show the ground glass nodule in the left upper lobe has metabolic activity of 2.7 SUV.  He has no evidence of metastatic lymphadenopathy or distant metastatic disease.  We have discussed the case and I am planning SBRT treatment to both his left upper lobe as well as his pelvis with areas of seminal vesicle nodal recurrence.  COMPLICATIONS OF TREATMENT: none  FOLLOW UP COMPLIANCE: keeps appointments   PHYSICAL EXAM:  Resp 18   Ht 5' 10 (1.778 m)   Wt 283 lb 1.6 oz (128.4 kg)   BMI 40.62 kg/m  Well-developed well-nourished patient in NAD. HEENT reveals PERLA, EOMI, discs not visualized.  Oral cavity is clear. No oral mucosal lesions are identified. Neck is clear  without evidence of cervical or supraclavicular adenopathy. Lungs are clear to A&P. Cardiac examination is essentially unremarkable with regular rate and rhythm without murmur rub or thrill. Abdomen is benign with no organomegaly or masses noted. Motor sensory and DTR levels are equal and symmetric in the upper and lower extremities. Cranial nerves II through XII are grossly intact. Proprioception is intact. No peripheral adenopathy or edema is identified. No motor or sensory levels are noted. Crude visual fields are within normal range.  RADIOLOGY RESULTS: PET/CT and PSMA PET scan both reviewed compatible with above-stated findings  PLAN: At this time do not do not need a tissue diagnosis of his left upper lobe lesion.  Would plan on delivering 55 Gray in 5 fractions using SBRT.  Risks and benefits of that treatment including extremely low side effect profile were discussed with the patient.  He may develop a slight cough after his treatments.  We also like to go ahead with SBRT treatment to his pelvis encompassing the seminal vesicle as well as avid lymph nodes in his right pelvis treated to 35 Gray in 5 fractions if we can accommodate all areas of tumor involvement.  Risks and benefits of that may include possible diarrhea fatigue possible diarrhea all were discussed in detail with the patient.  We will sequence his pelvic SBRT after completion of his lung SBRT.  Patient comprehends my recommendations well.  Also will continue to suppress the patient with ADT treatment as tolerated.  I would  like to take this opportunity to thank you for allowing me to participate in the care of your patient.SABRA Marcey Penton, MD

## 2024-04-18 ENCOUNTER — Encounter: Payer: Self-pay | Admitting: Family Medicine

## 2024-04-18 NOTE — Assessment & Plan Note (Signed)
Continue to follow with oncology and urology. Call with any concerns. Continue to monitor.

## 2024-04-18 NOTE — Assessment & Plan Note (Signed)
 Under good control on current regimen. Continue current regimen. Continue to monitor. Call with any concerns. Refills given. Labs drawn today.

## 2024-04-27 ENCOUNTER — Inpatient Hospital Stay: Attending: Internal Medicine

## 2024-04-27 DIAGNOSIS — C61 Malignant neoplasm of prostate: Secondary | ICD-10-CM

## 2024-04-27 NOTE — Progress Notes (Signed)
 Multidisciplinary Oncology Council Documentation  Curtis Bray. was presented by our [facility] on 04/27/2024, which included representatives from:  Palliative Care Dietitian  Physical/Occupational Therapist Nurse Navigator Genetics Social work Survivorship RN Financial Navigator Research RN   Curtis Bray currently presents with history of prostate cancer  We reviewed previous medical and familial history, history of present illness, and recent lab results along with all available histopathologic and imaging studies. The MOC considered available treatment options and made the following recommendations/referrals:  Genetics, SW  The [facility] is a meeting of clinicians from various specialty areas who evaluate and discuss patients for whom a multidisciplinary approach is being considered. Final determinations in the plan of care are those of the provider(s).   Today's extended care, comprehensive team conference, Curtis Bray was not present for the discussion and was not examined.

## 2024-05-02 ENCOUNTER — Ambulatory Visit
Admission: RE | Admit: 2024-05-02 | Discharge: 2024-05-02 | Disposition: A | Source: Ambulatory Visit | Attending: Radiation Oncology | Admitting: Radiation Oncology

## 2024-05-02 DIAGNOSIS — R918 Other nonspecific abnormal finding of lung field: Secondary | ICD-10-CM | POA: Diagnosis present

## 2024-05-02 DIAGNOSIS — Z510 Encounter for antineoplastic radiation therapy: Secondary | ICD-10-CM | POA: Insufficient documentation

## 2024-05-02 DIAGNOSIS — C61 Malignant neoplasm of prostate: Secondary | ICD-10-CM | POA: Diagnosis present

## 2024-05-05 DIAGNOSIS — Z510 Encounter for antineoplastic radiation therapy: Secondary | ICD-10-CM | POA: Diagnosis not present

## 2024-05-06 ENCOUNTER — Inpatient Hospital Stay

## 2024-05-06 NOTE — Progress Notes (Signed)
 [facility] Clinical Social Work  Clinical Social Work was referred by [facility] for assessment of psychosocial needs.  Clinical Social Worker contacted patient by phone to offer support and assess for needs.     Interventions: Provided patient with information about CSW role.  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  His primary concern is adjusting to his treatment.  He expressed concern regarding his work schedule.  Patient to follow up with his PCP to discuss further.       Follow Up Plan:  CSW will follow-up with patient by phone     Macario CHRISTELLA Au, LCSW  Clinical Social Worker [facility]

## 2024-05-09 ENCOUNTER — Ambulatory Visit
Admission: RE | Admit: 2024-05-09 | Discharge: 2024-05-09 | Disposition: A | Source: Ambulatory Visit | Attending: Radiation Oncology | Admitting: Radiation Oncology

## 2024-05-09 ENCOUNTER — Other Ambulatory Visit: Payer: Self-pay

## 2024-05-09 DIAGNOSIS — Z510 Encounter for antineoplastic radiation therapy: Secondary | ICD-10-CM | POA: Diagnosis not present

## 2024-05-09 LAB — RAD ONC ARIA SESSION SUMMARY
Course Elapsed Days: 0
Plan Fractions Treated to Date: 1
Plan Prescribed Dose Per Fraction: 11 Gy
Plan Total Fractions Prescribed: 5
Plan Total Prescribed Dose: 55 Gy
Reference Point Dosage Given to Date: 11 Gy
Reference Point Session Dosage Given: 11 Gy
Session Number: 1

## 2024-05-11 ENCOUNTER — Ambulatory Visit
Admission: RE | Admit: 2024-05-11 | Discharge: 2024-05-11 | Attending: Radiation Oncology | Admitting: Radiation Oncology

## 2024-05-11 ENCOUNTER — Other Ambulatory Visit: Payer: Self-pay

## 2024-05-11 DIAGNOSIS — Z510 Encounter for antineoplastic radiation therapy: Secondary | ICD-10-CM | POA: Diagnosis not present

## 2024-05-11 LAB — RAD ONC ARIA SESSION SUMMARY
Course Elapsed Days: 2
Plan Fractions Treated to Date: 2
Plan Prescribed Dose Per Fraction: 11 Gy
Plan Total Fractions Prescribed: 5
Plan Total Prescribed Dose: 55 Gy
Reference Point Dosage Given to Date: 22 Gy
Reference Point Session Dosage Given: 11 Gy
Session Number: 2

## 2024-05-16 ENCOUNTER — Other Ambulatory Visit: Payer: Self-pay

## 2024-05-16 ENCOUNTER — Ambulatory Visit

## 2024-05-16 DIAGNOSIS — Z510 Encounter for antineoplastic radiation therapy: Secondary | ICD-10-CM | POA: Diagnosis not present

## 2024-05-16 LAB — RAD ONC ARIA SESSION SUMMARY
Course Elapsed Days: 7
Plan Fractions Treated to Date: 3
Plan Prescribed Dose Per Fraction: 11 Gy
Plan Total Fractions Prescribed: 5
Plan Total Prescribed Dose: 55 Gy
Reference Point Dosage Given to Date: 33 Gy
Reference Point Session Dosage Given: 11 Gy
Session Number: 3

## 2024-05-18 ENCOUNTER — Other Ambulatory Visit: Payer: Self-pay

## 2024-05-18 DIAGNOSIS — Z510 Encounter for antineoplastic radiation therapy: Secondary | ICD-10-CM | POA: Diagnosis not present

## 2024-05-18 LAB — RAD ONC ARIA SESSION SUMMARY
Course Elapsed Days: 9
Plan Fractions Treated to Date: 4
Plan Prescribed Dose Per Fraction: 11 Gy
Plan Total Fractions Prescribed: 5
Plan Total Prescribed Dose: 55 Gy
Reference Point Dosage Given to Date: 44 Gy
Reference Point Session Dosage Given: 11 Gy
Session Number: 4

## 2024-05-23 ENCOUNTER — Ambulatory Visit
Admission: RE | Admit: 2024-05-23 | Discharge: 2024-05-23 | Disposition: A | Discharge status: Home / Self Care | Source: Ambulatory Visit | Attending: Radiation Oncology | Admitting: Radiation Oncology

## 2024-05-23 ENCOUNTER — Other Ambulatory Visit: Payer: Self-pay

## 2024-05-23 DIAGNOSIS — Z510 Encounter for antineoplastic radiation therapy: Secondary | ICD-10-CM | POA: Diagnosis not present

## 2024-05-23 LAB — RAD ONC ARIA SESSION SUMMARY
Course Elapsed Days: 14
Plan Fractions Treated to Date: 5
Plan Prescribed Dose Per Fraction: 11 Gy
Plan Total Fractions Prescribed: 5
Plan Total Prescribed Dose: 55 Gy
Reference Point Dosage Given to Date: 55 Gy
Reference Point Session Dosage Given: 11 Gy
Session Number: 5

## 2024-05-24 DIAGNOSIS — Z510 Encounter for antineoplastic radiation therapy: Secondary | ICD-10-CM | POA: Diagnosis not present

## 2024-05-24 NOTE — Radiation Completion Notes (Signed)
 Patient Name: Curtis Bray, TURNBAUGH MRN: [id number] Date of Birth: 11/02/1960 Referring Physician: DUWAINE LOUDER, M.D. Date of Service: 2024-05-24 Radiation Oncologist: Marcey Penton, M.D. [facility]                             RADIATION ONCOLOGY END OF TREATMENT NOTE     Diagnosis: C61 Malignant neoplasm of prostate; R91.8 Other nonspecific abnormal finding of lung field Staging on 2024-04-04: Malignant neoplasm of prostate (HCC) T=cT3a, N=cN1, M=cM0 Intent: Curative     HPI: Patient is a 63 year old male well-known to our apartment.  He is at 5 years from I-125 interstitial implant for Gleason 7 adenocarcinoma the prostate.SABRA  He discontinued ADT therapy with Lupron  in April 24.  His PSA has steadily climbed since 1 year ago now up to 2.44-month prior.  He is asymptomatic specifically denies any increased lower Neri tract symptoms diarrhea or fatigue.  He had a PSMA PET scan showing activity in the right seminal vesicle and multiple right pelvic lymph nodes consistent with recurrent prostate cancer.  No avid skeletal lesions were noted also incidentally noted was a 12 mm left apical nodule with a 7 mm solid component suspicious for primary lung adenocarcinoma.  He did have a dedicated PET scan which did show the ground glass nodule in the left upper lobe has metabolic activity of 2.7 SUV.  He has no evidence of metastatic lymphadenopathy or distant metastatic disease.  We have discussed the case and I am planning SBRT treatment to both his left upper lobe as well as his pelvis with areas of seminal vesicle nodal recurrence.      ==========DELIVERED PLANS==========  First Treatment Date: 2024-05-09 Last Treatment Date: 2024-05-23   Plan Name: Lung_L_SBRT Site: Lung, Left Technique: SBRT/SRT-IMRT Mode: Photon Dose Per Fraction: 11 Gy Prescribed Dose (Delivered / Prescribed): 55 Gy / 55 Gy Prescribed Fxs (Delivered / Prescribed): 5 / 5     ==========ON TREATMENT  VISIT DATES========== 2024-05-09, 2024-05-11, 2024-05-16, 2024-05-16, 2024-05-18, 2024-05-23     ==========UPCOMING VISITS========== 07/11/2024 [facility] OFFICE VISIT - LINZIE Malka Domino, MD  07/11/2024 [facility] PFT - PULM [facility] PFT RM  06/21/2024 [facility] RAD ONCOLOGY SBRT TREATMENT [id number]  06/15/2024 [facility] RAD ONCOLOGY SBRT TREATMENT [id number]  06/13/2024 [facility] RAD ONCOLOGY SBRT TREATMENT [id number]  06/09/2024 [facility] RAD ONCOLOGY SBRT TREATMENT [id number]  06/07/2024 [facility] RAD ONCOLOGY SBRT TREATMENT [id number]  06/03/2024 [facility] MED ONC EST PT Rennie Cindy SAUNDERS, MD  05/31/2024 [facility] MED ONC LAB [facility]  05/31/2024 [facility] MED ONC GENETIC COUNSEL Allyn Rattan T        ==========APPENDIX - ON TREATMENT VISIT NOTES==========   See weekly On Treatment Notes in Epic for details in the Media tab (listed as Progress notes on the On Treatment Visit Dates listed above).

## 2024-05-31 ENCOUNTER — Inpatient Hospital Stay

## 2024-05-31 ENCOUNTER — Other Ambulatory Visit: Payer: Self-pay | Admitting: Licensed Clinical Social Worker

## 2024-05-31 ENCOUNTER — Encounter: Payer: Self-pay | Admitting: Licensed Clinical Social Worker

## 2024-05-31 ENCOUNTER — Inpatient Hospital Stay (HOSPITAL_BASED_OUTPATIENT_CLINIC_OR_DEPARTMENT_OTHER): Admitting: Licensed Clinical Social Worker

## 2024-05-31 DIAGNOSIS — Z510 Encounter for antineoplastic radiation therapy: Secondary | ICD-10-CM | POA: Diagnosis present

## 2024-05-31 DIAGNOSIS — Z8042 Family history of malignant neoplasm of prostate: Secondary | ICD-10-CM

## 2024-05-31 DIAGNOSIS — Z1379 Encounter for other screening for genetic and chromosomal anomalies: Secondary | ICD-10-CM | POA: Diagnosis not present

## 2024-05-31 DIAGNOSIS — Z803 Family history of malignant neoplasm of breast: Secondary | ICD-10-CM

## 2024-05-31 DIAGNOSIS — Z7982 Long term (current) use of aspirin: Secondary | ICD-10-CM | POA: Insufficient documentation

## 2024-05-31 DIAGNOSIS — Z79899 Other long term (current) drug therapy: Secondary | ICD-10-CM | POA: Insufficient documentation

## 2024-05-31 DIAGNOSIS — R918 Other nonspecific abnormal finding of lung field: Secondary | ICD-10-CM | POA: Diagnosis present

## 2024-05-31 DIAGNOSIS — Z800 Family history of malignant neoplasm of digestive organs: Secondary | ICD-10-CM

## 2024-05-31 DIAGNOSIS — C61 Malignant neoplasm of prostate: Secondary | ICD-10-CM | POA: Diagnosis not present

## 2024-05-31 DIAGNOSIS — C7800 Secondary malignant neoplasm of unspecified lung: Secondary | ICD-10-CM | POA: Insufficient documentation

## 2024-05-31 DIAGNOSIS — Z87891 Personal history of nicotine dependence: Secondary | ICD-10-CM | POA: Insufficient documentation

## 2024-05-31 DIAGNOSIS — R911 Solitary pulmonary nodule: Secondary | ICD-10-CM

## 2024-05-31 LAB — CBC WITH DIFFERENTIAL (CANCER CENTER ONLY)
Abs Immature Granulocytes: 0.02 K/uL (ref 0.00–0.07)
Basophils Absolute: 0 K/uL (ref 0.0–0.1)
Basophils Relative: 1 %
Eosinophils Absolute: 0.1 K/uL (ref 0.0–0.5)
Eosinophils Relative: 3 %
HCT: 42.1 % (ref 39.0–52.0)
Hemoglobin: 14.6 g/dL (ref 13.0–17.0)
Immature Granulocytes: 0 %
Lymphocytes Relative: 38 %
Lymphs Abs: 1.9 K/uL (ref 0.7–4.0)
MCH: 29.6 pg (ref 26.0–34.0)
MCHC: 34.7 g/dL (ref 30.0–36.0)
MCV: 85.4 fL (ref 80.0–100.0)
Monocytes Absolute: 0.4 K/uL (ref 0.1–1.0)
Monocytes Relative: 8 %
Neutro Abs: 2.6 K/uL (ref 1.7–7.7)
Neutrophils Relative %: 50 %
Platelet Count: 142 K/uL — ABNORMAL LOW (ref 150–400)
RBC: 4.93 MIL/uL (ref 4.22–5.81)
RDW: 12.3 % (ref 11.5–15.5)
WBC Count: 5.1 K/uL (ref 4.0–10.5)
nRBC: 0 % (ref 0.0–0.2)

## 2024-05-31 LAB — CMP (CANCER CENTER ONLY)
ALT: 93 U/L — ABNORMAL HIGH (ref 0–44)
AST: 46 U/L — ABNORMAL HIGH (ref 15–41)
Albumin: 4.2 g/dL (ref 3.5–5.0)
Alkaline Phosphatase: 91 U/L (ref 38–126)
Anion gap: 11 (ref 5–15)
BUN: 12 mg/dL (ref 8–23)
CO2: 23 mmol/L (ref 22–32)
Calcium: 9.2 mg/dL (ref 8.9–10.3)
Chloride: 104 mmol/L (ref 98–111)
Creatinine: 1.14 mg/dL (ref 0.61–1.24)
GFR, Estimated: >60 mL/min
Glucose, Bld: 114 mg/dL — ABNORMAL HIGH (ref 70–99)
Potassium: 4 mmol/L (ref 3.5–5.1)
Sodium: 138 mmol/L (ref 135–145)
Total Bilirubin: 0.7 mg/dL (ref 0.0–1.2)
Total Protein: 6.5 g/dL (ref 6.5–8.1)

## 2024-05-31 LAB — PSA: Prostatic Specific Antigen: 0.53 ng/mL (ref 0.00–4.00)

## 2024-05-31 LAB — GENETIC SCREENING ORDER

## 2024-05-31 NOTE — Progress Notes (Signed)
"  gen  "

## 2024-05-31 NOTE — Progress Notes (Signed)
 REFERRING PROVIDER: Borders, Fonda SAUNDERS, NP [address] [place],  [place] [address]  PRIMARY PROVIDER:  Vicci Bouchard P, DO  PRIMARY REASON FOR VISIT:  1. Malignant neoplasm of prostate (HCC)   2. Family history of breast cancer      HISTORY OF PRESENT ILLNESS:   Mr. Curtis Bray, a 64 y.o. male, was seen for a [place] cancer genetics consultation due to a personal history of metastatic prostate cancer.  Mr. Reyez presents to clinic today to discuss the possibility of a hereditary predisposition to cancer, genetic testing, and to further clarify his future cancer risks, as well as potential cancer risks for family members.   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 He underwent his first Lupron  injection in May 2020, and underwent brachytherapy on 12/13/2018.    He opted to discontinue ADT after 1 year secondary to severe side effects of worsening depression and apathy.  PSA was undetectable after treatment, and had stabilized at 0.6 in April 2024, which is unchanged from April 2023.  We discussed the challenges in interpreting PSA values after brachytherapy and the concept of a PSA bounce.    Most recent PSA from 10/05/2023 increased to 1.6.    PROSTATE AND PROSTATE BED: Brachytherapy seeds are positioned throughout the prostate gland. There is intense PSMA activity localizing to the right seminal vesicle with SUV max equal to 22.7 on image 160.   LYMPH NODES: Several PSMA-avid lymph nodes along the right obturator space. For example, an 8 mm node on image 158 with SUV max equal to 40. A right internal iliac node is noted on image 154 with intense PSMA activity. No PSMA avid lymph nodes are identified outside of the pelvis.   BONES: No evidence of prostate cancer skeletal metastases. Anterior cervical fusion is noted.   OTHER PET  FINDINGS: Within the left lung apex, there is a solid nodule measuring 12 mm on image 47. An adjacent ground glass nodule measuring 2.5 mm has a central solid component measuring 7 mm. This solid and ground glass nodule complex does not have PSMA activity but is concerning for lung adenocarcinoma. Physiologic activity within the salivary glands, liver, spleen, kidneys, bowel, and urinary bladder.   IMPRESSION: 1. Intense PSMA activity in the right seminal vesicle and multiple right pelvic lymph nodes, consistent with recurrent prostate cancer. 2. No PSMA-avid lymph nodes identified outside the pelvis. 3. No evidence of skeletal metastases. 4. Solid 12 mm left apical lung nodule and adjacent part-solid ground-glass nodule with a 7 mm solid component, suspicious for primary lung adenocarcinoma, without PSMA activity. Recommend fdg PET scan and thoracic oncology Consultatio  # NOV MID 2025- Eligard .    Malignant neoplasm of prostate (HCC)  03/21/2019 Initial Diagnosis   Malignant neoplasm of prostate (HCC)   04/04/2024 Cancer Staging   Staging form: Prostate, AJCC 8th Edition - Clinical: Stage IVA (cT3a, cN1, cM0, Grade Group: 4) - Signed by Rennie Cindy SAUNDERS, MD on 04/05/2024 Gleason score: 8 Histologic grading system: 5 grade system     Past Medical History:  Diagnosis Date   Allergy    Cancer [facility])    Prostate   Carpal tunnel syndrome    DDD (degenerative disc disease), cervical    DDD (degenerative disc disease), cervical    Deafness in left ear    Dyspnea  with exertion   Edema    GERD (gastroesophageal reflux disease)    History of kidney stones    passed   Hyperlipidemia    Personal history of colonic polyps    Pinched nerve    upper and lower back   Prostate cancer (HCC)    Sleep apnea    Bi-pap   Sleep apnea    Stroke (HCC)    2013, Blind eye   TIA (transient ischemic attack) 07/2011   Umbilical hernia    Vision impairment    Blindness Right Eye     Past Surgical History:  Procedure Laterality Date   ANTERIOR CERVICAL DECOMP/DISCECTOMY FUSION N/A 03/30/2020   Procedure: Cervical Four-Five/Cervical Five-Six / Cervical Six-Seven  Anterior cervical decompression/discectomy/fusion;  Surgeon: Unice Pac, MD;  Location[facility] OR;  Service: Neurosurgery;  Laterality: N/A;  3C/RM 21   APPENDECTOMY     carpel tunnel Bilateral    COLONOSCOPY  2015   Found 2 beign polyps, repeat 10 years   COLONOSCOPY WITH PROPOFOL  N/A 03/30/2019   Procedure: COLONOSCOPY WITH PROPOFOL ;  Surgeon: Therisa Bi, MD;  Location: [facility] ENDOSCOPY;  Service: Gastroenterology;  Laterality: N/A;   ESOPHAGOGASTRODUODENOSCOPY (EGD) WITH PROPOFOL  N/A 03/30/2019   Procedure: ESOPHAGOGASTRODUODENOSCOPY (EGD) WITH PROPOFOL ;  Surgeon: Therisa Bi, MD;  Location: [facility] ENDOSCOPY;  Service: Gastroenterology;  Laterality: N/A;   POSTERIOR CERVICAL LAMINECTOMY WITH MET- RX Right 05/11/2020   Procedure: RIGHT CERVICAL FOUR-FIVE, CERVICAL FIVE-SIX FORAMINOTOMY WITH METRX;  Surgeon: Unice Pac, MD;  Location: [facility] OR;  Service: Neurosurgery;  Laterality: Right;  3C/RM 21   RADIOACTIVE SEED IMPLANT N/A 12/13/2018   Procedure: RADIOACTIVE SEED IMPLANT/BRACHYTHERAPY IMPLANT;  Surgeon: Francisca Redell BROCKS, MD;  Location: [facility] ORS;  Service: Urology;  Laterality: N/A;   TONSILLECTOMY      FAMILY HISTORY:  We obtained a detailed, 4-generation family history.  Significant diagnoses are listed below: Family History  Problem Relation Age of Onset   Hearing loss Father    Hypertension Father    Pneumonia Father    Breast cancer Paternal Aunt        dx 38s   Colon cancer Maternal Grandmother        dx>50   Breast cancer Paternal Grandmother        dx 59s   Asthma Son    Prostate cancer Maternal Cousin        76   Colon cancer Maternal Cousin    Mr. Cutler has 2 sons, 41 and 27 and 1 daughter, 75. He had 1 sister who passed at 32.  Mr. Seawood mother is living in her 24s. A maternal  cousin had prostate cancer at 50. Maternal cousin died of colon cancer. Maternal grandmother had colon cancer over age 84.  Mr. Satz father died at 6. Paternal aunt had breast cancer in her 22s, paternal grandmother also had breast cancer in her 61s.   Mr. Mozley is unaware of previous family history of genetic testing for hereditary cancer risks. There is no reported Ashkenazi Jewish ancestry. There is no known consanguinity.    GENETIC COUNSELING ASSESSMENT: Mr. Fiallos is a 64 y.o. male with a personal history of metastatic prostate cancer and family history of breast, colon and prostate cancer which is somewhat suggestive of a hereditary cancer syndrome and predisposition to cancer. We, therefore, discussed and recommended the following at today's visit.   DISCUSSION: We discussed that, in general, most cancer is not inherited in families, but instead is sporadic or familial. Sporadic cancers  occur by chance and typically happen at older ages (>50 years). We discussed that approximately 10% of prostate cancer is hereditary. Most cases of hereditary prostate cancer are associated with BRCA1/BRCA2 genes, although there are other genes associated with hereditary cancer as well. Cancers and risks are gene specific. We discussed that testing is beneficial for several reasons including knowing about cancer risks, identifying potential screening and risk-reduction options that may be appropriate, and to understand if other family members could be at risk for cancer and allow them to undergo genetic testing.   We reviewed the characteristics, features and inheritance patterns of hereditary cancer syndromes. We also discussed genetic testing, including the appropriate family members to test, the process of testing, insurance coverage and turn-around-time for results. We discussed the implications of a negative, positive and/or variant of uncertain significant result. We recommended Mr. Khachatryan pursue  genetic testing for the [facility] CancerNext+RNA gene panel.   The [facility] CancerNext+RNAinsight Panel includes sequencing, rearrangement analysis, and RNA analysis for the following 40 genes: APC, ATM, BAP1, BARD1, BMPR1A, BRCA1, BRCA2, BRIP1, CDH1, CDKN2A, CHEK2, FH, FLCN, MET, MLH1, MSH2, MSH6, MUTYH, NF1, NTHL1, PALB2, PMS2, PTEN, RAD51C, RAD51D, RPS20, SMAD4, STK11, TP53, TSC1, TSC2, and VHL (sequencing and deletion/duplication); AXIN2, HOXB13, MBD4, MSH3, POLD1 and POLE (sequencing only); EPCAM and GREM1 (deletion/duplication only).  Based on Mr. Mcleroy's personal and family history of cancer, he meets medical criteria for genetic testing. Despite that he meets criteria, he may still have an out of pocket cost.   PLAN: After considering the risks, benefits, and limitations, Mr. Guillet provided informed consent to pursue genetic testing and the blood sample was sent to [facility] for analysis of the CancerNext+RNA Panel. Results should be available within approximately 2-3 weeks' time, at which point they will be disclosed by telephone to Mr. Bangs, as will any additional recommendations warranted by these results. Mr. Maese will receive a summary of his genetic counseling visit and a copy of his results once available. This information will also be available in Epic.   Mr. Bertsch questions were answered to his satisfaction today. Our contact information was provided should additional questions or concerns arise. Thank you for the referral and allowing us  to share in the care of your patient.   Dena Cary, MS, [facility] Genetic Counselor [email or web address] Phone: [phone]  I personally spent a total of 40 minutes in the care of the patient today including getting/reviewing separately obtained history, counseling and educating, placing orders, and documenting clinical information in the EHR.  Dr. Delinda was available for discussion regarding this case.    _______________________________________________________________________ For Office Staff:  Number of people involved in session: 1 Was an Intern/ student involved with case: no

## 2024-06-03 ENCOUNTER — Encounter: Payer: Self-pay | Admitting: Internal Medicine

## 2024-06-03 ENCOUNTER — Inpatient Hospital Stay: Admitting: Internal Medicine

## 2024-06-03 DIAGNOSIS — C61 Malignant neoplasm of prostate: Secondary | ICD-10-CM | POA: Diagnosis not present

## 2024-06-03 DIAGNOSIS — Z510 Encounter for antineoplastic radiation therapy: Secondary | ICD-10-CM | POA: Diagnosis not present

## 2024-06-03 NOTE — Progress Notes (Signed)
 [facility] OFFICE PROGRESS NOTE  Patient Care Team: Vicci Duwaine SQUIBB, DO as PCP - General (Family Medicine) Vicci Duwaine SQUIBB, DO as Referring Physician (Family Medicine) Dessa Reyes ORN, MD (General Surgery) Maree Jannett POUR, MD (Neurology) Rennie Curtis SAUNDERS, MD as Consulting Physician (Oncology)   Cancer Staging  Malignant neoplasm of prostate [facility]) Staging form: Prostate, AJCC 8th Edition - Clinical: Stage IVA (cT3a, cN1, cM0, Grade Group: 4) - Signed by Rennie Curtis SAUNDERS, MD on 04/05/2024 Gleason score: 8 Histologic grading system: 5 grade system   Latest Reference Range & Units Most Recent 05/28/18 08:46 04/11/19 16:01 04/20/19 14:29 10/05/19 15:04 01/05/20 09:02 06/29/20 12:12 01/25/21 11:37 05/02/21 16:47 09/05/21 15:50 09/08/22 15:05 10/05/23 16:25 12/04/23 08:43 03/08/24 14:52  Prostate Specific Ag, Serum 0.0 - 4.0 ng/mL 2.0 03/08/24 14:52 6.7 (H) <0.1  <0.1  0.1 0.4 0.5 0.6 0.6 1.6  2.0  Prostatic Specific Antigen 0.00 - 4.00 ng/mL 1.57 12/04/23 08:43   0.03  0.02       1.57   (H): Data is abnormally high  Oncology History Overview Note   elevated PSA of 6.7 in March 2020 and was found to have high volume high risk prostate cancer with no evidence of metastatic disease. He underwent his first Lupron  injection in May 2020, and underwent brachytherapy on 12/13/2018.    He opted to discontinue ADT after 1 year secondary to severe side effects of worsening depression and apathy.  PSA was undetectable after treatment, and had stabilized at 0.6 in April 2024, which is unchanged from April 2023.  We discussed the challenges in interpreting PSA values after brachytherapy and the concept of a PSA bounce.    Most recent PSA from 10/05/2023 increased to 1.6.    PROSTATE AND PROSTATE BED: Brachytherapy seeds are positioned throughout the prostate gland. There is intense PSMA activity localizing to the right seminal vesicle with SUV max equal to 22.7 on image 160.    LYMPH NODES: Several PSMA-avid lymph nodes along the right obturator space. For example, an 8 mm node on image 158 with SUV max equal to 40. A right internal iliac node is noted on image 154 with intense PSMA activity. No PSMA avid lymph nodes are identified outside of the pelvis.   BONES: No evidence of prostate cancer skeletal metastases. Anterior cervical fusion is noted.   OTHER PET FINDINGS: Within the left lung apex, there is a solid nodule measuring 12 mm on image 47. An adjacent ground glass nodule measuring 2.5 mm has a central solid component measuring 7 mm. This solid and ground glass nodule complex does not have PSMA activity but is concerning for lung adenocarcinoma. Physiologic activity within the salivary glands, liver, spleen, kidneys, bowel, and urinary bladder.   IMPRESSION: 1. Intense PSMA activity in the right seminal vesicle and multiple right pelvic lymph nodes, consistent with recurrent prostate cancer. 2. No PSMA-avid lymph nodes identified outside the pelvis. 3. No evidence of skeletal metastases. 4. Solid 12 mm left apical lung nodule and adjacent part-solid ground-glass nodule with a 7 mm solid component, suspicious for primary lung adenocarcinoma, without PSMA activity. Recommend fdg PET scan and thoracic oncology Consultatio  # NOV MID 2025- Eligard .    Malignant neoplasm of prostate (HCC)  03/21/2019 Initial Diagnosis   Malignant neoplasm of prostate (HCC)   04/04/2024 Cancer Staging   Staging form: Prostate, AJCC 8th Edition - Clinical: Stage IVA (cT3a, cN1, cM0, Grade Group: 4) - Signed by Rennie Curtis SAUNDERS, MD on  04/05/2024 Gleason score: 8 Histologic grading system: 5 grade system      HISTORY OF PRESENT ILLNESS:   Curtis Bray. 64 y.o.  male pleasant patient above history of prostate cancer; and lung nodules..  Discussed the use of AI scribe software for clinical note transcription with the patient, who gave verbal consent to  proceed.  History of Present Illness     Curtis Bray. is a 64 year old male with metastatic prostate cancer involving the lung who presents for management of persistent cough.  He has a history of prostate cancer and has been told that there are lesions in both the lung and prostate. He has been undergoing radiation therapy to both the lung lesion.  Patient awaiting to start radiation to the prostate after the lung radiation.SABRA  He experiences frequent and severe coughing spells, which are particularly bothersome at night. He anticipates worsening symptoms over the next month, as previously discussed with his oncology team. He has not initiated cough medication and is uncertain of its efficacy. He describes the cough as possibly related to an upper respiratory process.  He requested renewal of his previous Wellbutrin  prescription, which he had used after a prior treatment series for mood symptoms. He is not currently taking Wellbutrin  and was advised to contact his primary care provider for medication renewal.     Review of Systems  Constitutional:  Negative for chills, diaphoresis, fever, malaise/fatigue and weight loss.  HENT:  Negative for nosebleeds and sore throat.   Eyes:  Negative for double vision.  Respiratory:  Negative for cough, hemoptysis, sputum production, shortness of breath and wheezing.   Cardiovascular:  Negative for chest pain, palpitations, orthopnea and leg swelling.  Gastrointestinal:  Negative for abdominal pain, blood in stool, constipation, diarrhea, heartburn, melena, nausea and vomiting.  Genitourinary:  Negative for dysuria, frequency and urgency.  Musculoskeletal:  Negative for back pain and joint pain.  Skin: Negative.  Negative for itching and rash.  Neurological:  Negative for dizziness, tingling, focal weakness, weakness and headaches.  Endo/Heme/Allergies:  Does not bruise/bleed easily.  Psychiatric/Behavioral:  Negative for depression. The patient is  not nervous/anxious and does not have insomnia.       PAST MEDICAL HISTORY :  Past Medical History:  Diagnosis Date   Allergy    Cancer [facility])    Prostate   Carpal tunnel syndrome    DDD (degenerative disc disease), cervical    DDD (degenerative disc disease), cervical    Deafness in left ear    Dyspnea    with exertion   Edema    GERD (gastroesophageal reflux disease)    History of kidney stones    passed   Hyperlipidemia    Personal history of colonic polyps    Pinched nerve    upper and lower back   Prostate cancer (HCC)    Sleep apnea    Bi-pap   Sleep apnea    Stroke (HCC)    2013, Blind eye   TIA (transient ischemic attack) 07/2011   Umbilical hernia    Vision impairment    Blindness Right Eye    PAST SURGICAL HISTORY :   Past Surgical History:  Procedure Laterality Date   ANTERIOR CERVICAL DECOMP/DISCECTOMY FUSION N/A 03/30/2020   Procedure: Cervical Four-Five/Cervical Five-Six / Cervical Six-Seven  Anterior cervical decompression/discectomy/fusion;  Surgeon: Unice Pac, MD;  Location: [facility] OR;  Service: Neurosurgery;  Laterality: N/A;  3C/RM 21   APPENDECTOMY     carpel tunnel Bilateral  COLONOSCOPY  2015   Found 2 beign polyps, repeat 10 years   COLONOSCOPY WITH PROPOFOL  N/A 03/30/2019   Procedure: COLONOSCOPY WITH PROPOFOL ;  Surgeon: Therisa Bi, MD;  Location: [facility] ENDOSCOPY;  Service: Gastroenterology;  Laterality: N/A;   ESOPHAGOGASTRODUODENOSCOPY (EGD) WITH PROPOFOL  N/A 03/30/2019   Procedure: ESOPHAGOGASTRODUODENOSCOPY (EGD) WITH PROPOFOL ;  Surgeon: Therisa Bi, MD;  Location: [facility] ENDOSCOPY;  Service: Gastroenterology;  Laterality: N/A;   POSTERIOR CERVICAL LAMINECTOMY WITH MET- RX Right 05/11/2020   Procedure: RIGHT CERVICAL FOUR-FIVE, CERVICAL FIVE-SIX FORAMINOTOMY WITH METRX;  Surgeon: Unice Pac, MD;  Location: [facility] OR;  Service: Neurosurgery;  Laterality: Right;  3C/RM 21   RADIOACTIVE SEED IMPLANT N/A 12/13/2018   Procedure: RADIOACTIVE SEED  IMPLANT/BRACHYTHERAPY IMPLANT;  Surgeon: Francisca Redell BROCKS, MD;  Location: [facility] ORS;  Service: Urology;  Laterality: N/A;   TONSILLECTOMY      FAMILY HISTORY :   Family History  Problem Relation Age of Onset   Hearing loss Father    Hypertension Father    Pneumonia Father    Breast cancer Paternal Aunt        dx 37s   Colon cancer Maternal Grandmother        dx>50   Breast cancer Paternal Grandmother        dx 43s   Asthma Son    Prostate cancer Maternal Cousin        75   Colon cancer Maternal Cousin     SOCIAL HISTORY:   Social History   Tobacco Use   Smoking status: Former    Current packs/day: 0.00    Types: Cigarettes    Start date: 05/26/1977    Quit date: 05/27/1995    Years since quitting: 29.0    Passive exposure: Past   Smokeless tobacco: Never  Vaping Use   Vaping status: Never Used  Substance Use Topics   Alcohol use: Yes    Alcohol/week: 2.0 standard drinks of alcohol    Types: 2 Glasses of wine per week    Comment: prn   Drug use: No    ALLERGIES:  has no known allergies.  MEDICATIONS:  Current Outpatient Medications  Medication Sig Dispense Refill   albuterol  (VENTOLIN  HFA) 108 (90 Base) MCG/ACT inhaler Inhale 2 puffs into the lungs every 6 (six) hours as needed for wheezing or shortness of breath. 8 g 6   aspirin  EC (ASPIRIN  LOW DOSE) 81 MG tablet TAKE 1 TABLET BY MOUTH ONCE DAILY 90 tablet 1   Fenbendazole POWD by Does not apply route.     fluticasone  (FLONASE ) 50 MCG/ACT nasal spray PLACE 2 SPRAYS INTO BOTH NOSTRILS ONCE DAILY (Patient taking differently: Place 2 sprays into both nostrils as needed.) 48 g 3   ivermectin (STROMECTOL) 3 MG TABS tablet Take 200 mcg/kg by mouth once.     meloxicam  (MOBIC ) 15 MG tablet Take 1 tablet (15 mg total) by mouth daily. 90 tablet 1   RABEprazole  (ACIPHEX ) 20 MG tablet Take 2 tablets (40 mg total) by mouth daily. 180 tablet 1   traZODone  (DESYREL ) 50 MG tablet Take 2 tablets (100 mg total) by mouth at bedtime as  needed for sleep. 180 tablet 1   DULoxetine  (CYMBALTA ) 20 MG capsule Take 2 capsules (40 mg total) by mouth daily. (Patient not taking: Reported on 06/03/2024) 180 capsule 1   simvastatin  (ZOCOR ) 40 MG tablet Take 1 tablet (40 mg total) by mouth daily at 6 PM. (Patient not taking: Reported on 06/03/2024) 90 tablet 1   tadalafil  (  CIALIS ) 10 MG tablet Take 1-2 tablets (10-20 mg total) by mouth daily. (Patient not taking: Reported on 06/03/2024) 90 tablet 3   tamsulosin  (FLOMAX ) 0.4 MG CAPS capsule Take 2 capsules (0.8 mg total) by mouth daily. (Patient not taking: Reported on 06/03/2024) 180 capsule 1   No current facility-administered medications for this visit.    PHYSICAL EXAMINATION:   BP 138/83 (BP Location: Right Arm, Patient Position: Sitting, Cuff Size: Large)   Pulse 67   Temp 98.7 F (37.1 C) (Tympanic)   Resp (!) 24   Ht 5' 10 (1.778 m)   Wt 289 lb 6.4 oz (131.3 kg)   SpO2 98%   BMI 41.52 kg/m   Filed Weights   06/03/24 1258  Weight: 289 lb 6.4 oz (131.3 kg)    Physical Exam Vitals and nursing note reviewed.  HENT:     Head: Normocephalic and atraumatic.     Mouth/Throat:     Pharynx: Oropharynx is clear.  Eyes:     Extraocular Movements: Extraocular movements intact.     Pupils: Pupils are equal, round, and reactive to light.  Cardiovascular:     Rate and Rhythm: Normal rate and regular rhythm.  Pulmonary:     Comments: Decreased breath sounds bilaterally.  Abdominal:     Palpations: Abdomen is soft.  Musculoskeletal:        General: Normal range of motion.     Cervical back: Normal range of motion.  Skin:    General: Skin is warm.  Neurological:     General: No focal deficit present.     Mental Status: He is alert and oriented to person, place, and time.  Psychiatric:        Behavior: Behavior normal.        Judgment: Judgment normal.      LABORATORY DATA:  I have reviewed the data as listed    Component Value Date/Time   NA 138 05/31/2024 1537   NA  138 10/05/2023 1625   K 4.0 05/31/2024 1537   CL 104 05/31/2024 1537   CO2 23 05/31/2024 1537   GLUCOSE 114 (H) 05/31/2024 1537   BUN 12 05/31/2024 1537   BUN 8 10/05/2023 1625   CREATININE 1.14 05/31/2024 1537   CALCIUM 9.2 05/31/2024 1537   PROT 6.5 05/31/2024 1537   PROT 6.4 10/05/2023 1625   ALBUMIN 4.2 05/31/2024 1537   ALBUMIN 4.2 10/05/2023 1625   AST 46 (H) 05/31/2024 1537   ALT 93 (H) 05/31/2024 1537   ALKPHOS 91 05/31/2024 1537   BILITOT 0.7 05/31/2024 1537   GFRNONAA >60 05/31/2024 1537   GFRAA 84 06/29/2020 1212    No results found for: SPEP, UPEP  Lab Results  Component Value Date   WBC 5.1 05/31/2024   NEUTROABS 2.6 05/31/2024   HGB 14.6 05/31/2024   HCT 42.1 05/31/2024   MCV 85.4 05/31/2024   PLT 142 (L) 05/31/2024      Chemistry      Component Value Date/Time   NA 138 05/31/2024 1537   NA 138 10/05/2023 1625   K 4.0 05/31/2024 1537   CL 104 05/31/2024 1537   CO2 23 05/31/2024 1537   BUN 12 05/31/2024 1537   BUN 8 10/05/2023 1625   CREATININE 1.14 05/31/2024 1537      Component Value Date/Time   CALCIUM 9.2 05/31/2024 1537   ALKPHOS 91 05/31/2024 1537   AST 46 (H) 05/31/2024 1537   ALT 93 (H) 05/31/2024 1537   BILITOT 0.7  05/31/2024 1537       RADIOGRAPHIC STUDIES: I have personally reviewed the radiological images as listed and agreed with the findings in the report. No results found.   ASSESSMENT & PLAN:  Malignant neoplasm of prostate (HCC) # NOV 2025 [Dr.Sninski] RECURRENT PROSTATE CANCER- PROSTATE AND PROSTATE BED: Brachytherapy seeds are positioned throughout the prostate gland. There is intense PSMA activity localizing to the right seminal vesicle with SUV max equal to 22.7 on image 160; LYMPH NODES: Several PSMA-avid lymph nodes along the right obturator space. No PSMA avid lymph nodes are identified outside of the pelvis. No evidence of prostate cancer skeletal metastases.  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  PSA improving.  Plan for radiation given the low-volume disease after finishing the lung SBRT  # NOV 2025- incidental [no tissus Dx [Dr.Assaker]]: Within the left lung apex, there is a solid nodule measuring 12 mm; An adjacent ground glass nodule measuring 2.5 mm has a central solid component measuring 7 mm.  Currently undergoing SBRT with radiation oncology.  # History of cancers in the family aunt breast cancer; grand mother- breast- 2 boys- and girl.   Pending  genetic testing at next visit.  # URI- recommend claritin- D.   04/13/2024- eligard  q  # DISPOSITION: # follow up in End of MAY 2026-- MD; 2-3 days prior labs- cbc/cmp; PSA- Dr.B    Orders Placed This Encounter  Procedures   CBC with Differential (Cancer Center Only)    Standing Status:   Future    Expected Date:   10/20/2024    Expiration Date:   01/18/2025   CMP (Cancer Center only)    Standing Status:   Future    Expected Date:   10/20/2024    Expiration Date:   01/18/2025   PSA    Standing Status:   Future    Expected Date:   10/20/2024    Expiration Date:   01/18/2025   All questions were answered. The patient knows to call the clinic with any problems, questions or concerns.      Curtis JONELLE Joe, MD 06/03/2024 1:37 PM

## 2024-06-03 NOTE — Assessment & Plan Note (Addendum)
#   NOV 2025 [Dr.Sninski] RECURRENT PROSTATE CANCER- PROSTATE AND PROSTATE BED: Brachytherapy seeds are positioned throughout the prostate gland. There is intense PSMA activity localizing to the right seminal vesicle with SUV max equal to 22.7 on image 160; LYMPH NODES: Several PSMA-avid lymph nodes along the right obturator space. No PSMA avid lymph nodes are identified outside of the pelvis. No evidence of prostate cancer skeletal metastases.  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  PSA improving.  Plan for radiation given the low-volume disease after finishing the lung SBRT  # NOV 2025- incidental [no tissus Dx [Dr.Assaker]]: Within the left lung apex, there is a solid nodule measuring 12 mm; An adjacent ground glass nodule measuring 2.5 mm has a central solid component measuring 7 mm.  Currently undergoing SBRT with radiation oncology.  # History of cancers in the family aunt breast cancer; grand mother- breast- 2 boys- and girl.   Pending  genetic testing at next visit.  # URI- recommend claritin- D.   04/13/2024- eligard  q  # DISPOSITION: # follow up in End of MAY 2026-- MD; 2-3 days prior labs- cbc/cmp; PSA- Dr.B

## 2024-06-03 NOTE — Progress Notes (Signed)
 Pt states he fills drained, feeling bloated/swelling in the chest area x1 week and SOB and congested, no fever. No chest pain. He is waking up at night sweating.

## 2024-06-06 ENCOUNTER — Ambulatory Visit

## 2024-06-07 ENCOUNTER — Other Ambulatory Visit: Payer: Self-pay

## 2024-06-07 ENCOUNTER — Ambulatory Visit
Admission: RE | Admit: 2024-06-07 | Discharge: 2024-06-07 | Disposition: A | Discharge status: Home / Self Care | Source: Ambulatory Visit | Attending: Radiation Oncology | Admitting: Radiation Oncology

## 2024-06-07 DIAGNOSIS — Z510 Encounter for antineoplastic radiation therapy: Secondary | ICD-10-CM | POA: Diagnosis not present

## 2024-06-07 DIAGNOSIS — R918 Other nonspecific abnormal finding of lung field: Secondary | ICD-10-CM | POA: Insufficient documentation

## 2024-06-07 DIAGNOSIS — C61 Malignant neoplasm of prostate: Secondary | ICD-10-CM | POA: Insufficient documentation

## 2024-06-07 LAB — RAD ONC ARIA SESSION SUMMARY
Course Elapsed Days: 29
Plan Fractions Treated to Date: 1
Plan Prescribed Dose Per Fraction: 7 Gy
Plan Total Fractions Prescribed: 5
Plan Total Prescribed Dose: 35 Gy
Reference Point Dosage Given to Date: 7 Gy
Reference Point Session Dosage Given: 7 Gy
Session Number: 6

## 2024-06-08 ENCOUNTER — Ambulatory Visit

## 2024-06-09 ENCOUNTER — Other Ambulatory Visit: Payer: Self-pay

## 2024-06-09 ENCOUNTER — Ambulatory Visit
Admission: RE | Admit: 2024-06-09 | Discharge: 2024-06-09 | Disposition: A | Source: Ambulatory Visit | Attending: Radiation Oncology | Admitting: Radiation Oncology

## 2024-06-09 DIAGNOSIS — Z510 Encounter for antineoplastic radiation therapy: Secondary | ICD-10-CM | POA: Diagnosis not present

## 2024-06-09 LAB — RAD ONC ARIA SESSION SUMMARY
Course Elapsed Days: 31
Plan Fractions Treated to Date: 2
Plan Prescribed Dose Per Fraction: 7 Gy
Plan Total Fractions Prescribed: 5
Plan Total Prescribed Dose: 35 Gy
Reference Point Dosage Given to Date: 14 Gy
Reference Point Session Dosage Given: 7 Gy
Session Number: 7

## 2024-06-13 ENCOUNTER — Other Ambulatory Visit: Payer: Self-pay

## 2024-06-13 ENCOUNTER — Ambulatory Visit

## 2024-06-13 ENCOUNTER — Telehealth: Payer: Self-pay | Admitting: Licensed Clinical Social Worker

## 2024-06-13 ENCOUNTER — Encounter: Payer: Self-pay | Admitting: Licensed Clinical Social Worker

## 2024-06-13 ENCOUNTER — Ambulatory Visit: Payer: Self-pay | Admitting: Licensed Clinical Social Worker

## 2024-06-13 ENCOUNTER — Ambulatory Visit
Admission: RE | Admit: 2024-06-13 | Discharge: 2024-06-13 | Disposition: A | Source: Ambulatory Visit | Attending: Radiation Oncology | Admitting: Radiation Oncology

## 2024-06-13 DIAGNOSIS — Z1379 Encounter for other screening for genetic and chromosomal anomalies: Secondary | ICD-10-CM | POA: Insufficient documentation

## 2024-06-13 DIAGNOSIS — Z510 Encounter for antineoplastic radiation therapy: Secondary | ICD-10-CM | POA: Diagnosis not present

## 2024-06-13 LAB — RAD ONC ARIA SESSION SUMMARY
Course Elapsed Days: 35
Plan Fractions Treated to Date: 3
Plan Prescribed Dose Per Fraction: 7 Gy
Plan Total Fractions Prescribed: 5
Plan Total Prescribed Dose: 35 Gy
Reference Point Dosage Given to Date: 21 Gy
Reference Point Session Dosage Given: 7 Gy
Session Number: 8

## 2024-06-13 NOTE — Telephone Encounter (Signed)
 I contacted Mr. Fina to discuss his genetic testing results. No pathogenic variants were identified in the 40 genes analyzed. Detailed clinic note to follow.   The test report has been scanned into EPIC and is located under the Molecular Pathology section of the Results Review tab.  A portion of the result report is included below for reference.      Dena Cary, MS, [facility] Genetic Counselor [email or web address] Phone: [phone]

## 2024-06-13 NOTE — Progress Notes (Signed)
 HPI:   Curtis Bray was previously seen in the [facility] due to a personal and family history of cancer and concerns regarding a hereditary predisposition to cancer. Please refer to our prior cancer genetics clinic note for more information regarding our discussion, assessment and recommendations, at the time. Curtis Bray recent genetic test results were disclosed to him, as were recommendations warranted by these results. These results and recommendations are discussed in more detail below.  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 He underwent his first Lupron  injection in May 2020, and underwent brachytherapy on 12/13/2018.    He opted to discontinue ADT after 1 year secondary to severe side effects of worsening depression and apathy.  PSA was undetectable after treatment, and had stabilized at 0.6 in April 2024, which is unchanged from April 2023.  We discussed the challenges in interpreting PSA values after brachytherapy and the concept of a PSA bounce.    Most recent PSA from 10/05/2023 increased to 1.6.    PROSTATE AND PROSTATE BED: Brachytherapy seeds are positioned throughout the prostate gland. There is intense PSMA activity localizing to the right seminal vesicle with SUV max equal to 22.7 on image 160.   LYMPH NODES: Several PSMA-avid lymph nodes along the right obturator space. For example, an 8 mm node on image 158 with SUV max equal to 40. A right internal iliac node is noted on image 154 with intense PSMA activity. No PSMA avid lymph nodes are identified outside of the pelvis.   BONES: No evidence of prostate cancer skeletal metastases. Anterior cervical fusion is noted.   OTHER PET FINDINGS: Within the left lung apex, there is a solid nodule measuring 12 mm on image 47. An adjacent ground glass nodule measuring  2.5 mm has a central solid component measuring 7 mm. This solid and ground glass nodule complex does not have PSMA activity but is concerning for lung adenocarcinoma. Physiologic activity within the salivary glands, liver, spleen, kidneys, bowel, and urinary bladder.   IMPRESSION: 1. Intense PSMA activity in the right seminal vesicle and multiple right pelvic lymph nodes, consistent with recurrent prostate cancer. 2. No PSMA-avid lymph nodes identified outside the pelvis. 3. No evidence of skeletal metastases. 4. Solid 12 mm left apical lung nodule and adjacent part-solid ground-glass nodule with a 7 mm solid component, suspicious for primary lung adenocarcinoma, without PSMA activity. Recommend fdg PET scan and thoracic oncology Consultatio  # NOV MID 2025- Eligard .    Malignant neoplasm of prostate (HCC)  03/21/2019 Initial Diagnosis   Malignant neoplasm of prostate (HCC)   04/04/2024 Cancer Staging   Staging form: Prostate, AJCC 8th Edition - Clinical: Stage IVA (cT3a, cN1, cM0, Grade Group: 4) - Signed by Rennie Cindy SAUNDERS, MD on 04/05/2024 Gleason score: 8 Histologic grading system: 5 grade system   06/12/2024 Genetic Testing   Negative genetic testing. No pathogenic variants identified on the [facility] CancerNext+RNA Panel. The report date is 06/12/2024.  The [facility] CancerNext+RNAinsight Panel includes sequencing, rearrangement analysis, and RNA analysis for the following 40 genes: APC, ATM, BAP1, BARD1, BMPR1A, BRCA1, BRCA2, BRIP1, CDH1, CDKN2A, CHEK2, FH, FLCN, MET, MLH1, MSH2, MSH6, MUTYH, NF1, NTHL1, PALB2, PMS2, PTEN, RAD51C, RAD51D, RPS20, SMAD4, STK11, TP53, TSC1, TSC2, and VHL (sequencing and deletion/duplication); AXIN2, HOXB13, MBD4, MSH3, POLD1 and POLE (sequencing only); EPCAM and GREM1 (deletion/duplication only).  FAMILY HISTORY:  We obtained a detailed, 4-generation family history.  Significant diagnoses are listed below: Family History  Problem Relation Age  of Onset   Hearing loss Father    Hypertension Father    Pneumonia Father    Breast cancer Paternal Aunt        dx 42s   Colon cancer Maternal Grandmother        dx>50   Breast cancer Paternal Grandmother        dx 43s   Asthma Son    Prostate cancer Maternal Cousin        15   Colon cancer Maternal Cousin       Curtis Bray has 2 sons, 41 and 5 and 1 daughter, 62. He had 1 sister who passed at 52.   Curtis Bray mother is living in her 63s. A maternal cousin had prostate cancer at 56. Maternal cousin died of colon cancer. Maternal grandmother had colon cancer over age 31.   Curtis Bray father died at 33. Paternal aunt had breast cancer in her 11s, paternal grandmother also had breast cancer in her 66s.    Curtis Bray is unaware of previous family history of genetic testing for hereditary cancer risks. There is no reported Ashkenazi Jewish ancestry. There is no known consanguinity.     GENETIC TEST RESULTS:  The [facility] CancerNext+RNA Panel found no pathogenic mutations.   The [facility] CancerNext+RNAinsight Panel includes sequencing, rearrangement analysis, and RNA analysis for the following 40 genes: APC, ATM, BAP1, BARD1, BMPR1A, BRCA1, BRCA2, BRIP1, CDH1, CDKN2A, CHEK2, FH, FLCN, MET, MLH1, MSH2, MSH6, MUTYH, NF1, NTHL1, PALB2, PMS2, PTEN, RAD51C, RAD51D, RPS20, SMAD4, STK11, TP53, TSC1, TSC2, and VHL (sequencing and deletion/duplication); AXIN2, HOXB13, MBD4, MSH3, POLD1 and POLE (sequencing only); EPCAM and GREM1 (deletion/duplication only).   The test report has been scanned into EPIC and is located under the Molecular Pathology section of the Results Review tab.  A portion of the result report is included below for reference. Genetic testing reported out on 06/12/2024.       Even though a pathogenic variant was not identified, possible explanations for the cancer in the family may include: There may be no hereditary risk for cancer in the family. The cancers in Curtis Bray  and/or his family may be sporadic/familial or due to other genetic and environmental factors. There may be a gene mutation in one of these genes that current testing methods cannot detect but that chance is small. There could be another gene that has not yet been discovered, or that we have not yet tested, that is responsible for the cancer diagnoses in the family.  It is also possible there is a hereditary cause for the cancer in the family that Curtis Bray did not inherit.  Therefore, it is important to remain in touch with cancer genetics in the future so that we can continue to offer Curtis Bray the most up to date genetic testing.   ADDITIONAL GENETIC TESTING:  We discussed with Curtis Bray that his genetic testing was fairly extensive.  If there are additional relevant genes identified to increase cancer risk that can be analyzed in the future, we would be happy to discuss and coordinate this testing at that time.    CANCER SCREENING RECOMMENDATIONS:  Curtis Bray test result is considered negative (normal).  This means that we have not identified a hereditary cause for his personal and family history of cancer at this time.   An individual's cancer risk and  medical management are not determined by genetic test results alone. Overall cancer risk assessment incorporates additional factors, including personal medical history, family history, and any available genetic information that may result in a personalized plan for cancer prevention and surveillance. Therefore, it is recommended he continue to follow the cancer management and screening guidelines provided by his oncology and primary healthcare provider.  RECOMMENDATIONS FOR FAMILY MEMBERS:   Since he did not inherit a identifiable mutation in a cancer predisposition gene included on this panel, his children could not have inherited a known mutation from him in one of these genes. Individuals in this family might be at some increased risk of  developing cancer, over the general population risk, due to the family history of cancer.  Individuals in the family should notify their providers of the family history of cancer. We recommend women in this family have a yearly mammogram beginning at age 53, or 14 years younger than the earliest onset of cancer, an annual clinical breast exam, and perform monthly breast self-exams.  Family members should have colonoscopies by at age 31, or earlier, as recommended by their providers.   FOLLOW-UP:  Lastly, we discussed with Curtis Bray that cancer genetics is a rapidly advancing field and it is possible that new genetic tests will be appropriate for him and/or his family members in the future. We encouraged him to remain in contact with cancer genetics on an annual basis so we can update his personal and family histories and let him know of advances in cancer genetics that may benefit this family.   Our contact number was provided. Curtis Bray questions were answered to his satisfaction, and he knows he is welcome to call us  at anytime with additional questions or concerns.    Dena Cary, MS, [facility] Genetic Counselor [email or web address] Phone: [phone]

## 2024-06-14 ENCOUNTER — Ambulatory Visit

## 2024-06-15 ENCOUNTER — Ambulatory Visit
Admission: RE | Admit: 2024-06-15 | Discharge: 2024-06-15 | Disposition: A | Source: Ambulatory Visit | Attending: Radiation Oncology | Admitting: Radiation Oncology

## 2024-06-15 ENCOUNTER — Other Ambulatory Visit: Payer: Self-pay

## 2024-06-15 DIAGNOSIS — Z510 Encounter for antineoplastic radiation therapy: Secondary | ICD-10-CM | POA: Diagnosis not present

## 2024-06-15 LAB — RAD ONC ARIA SESSION SUMMARY
Course Elapsed Days: 37
Plan Fractions Treated to Date: 4
Plan Prescribed Dose Per Fraction: 7 Gy
Plan Total Fractions Prescribed: 5
Plan Total Prescribed Dose: 35 Gy
Reference Point Dosage Given to Date: 28 Gy
Reference Point Session Dosage Given: 7 Gy
Session Number: 9

## 2024-06-20 ENCOUNTER — Ambulatory Visit

## 2024-06-21 ENCOUNTER — Ambulatory Visit

## 2024-06-22 ENCOUNTER — Inpatient Hospital Stay

## 2024-06-22 ENCOUNTER — Other Ambulatory Visit: Payer: Self-pay | Admitting: *Deleted

## 2024-06-22 ENCOUNTER — Ambulatory Visit
Admission: RE | Admit: 2024-06-22 | Discharge: 2024-06-22 | Attending: Radiation Oncology | Admitting: Radiation Oncology

## 2024-06-22 ENCOUNTER — Other Ambulatory Visit: Payer: Self-pay

## 2024-06-22 DIAGNOSIS — C61 Malignant neoplasm of prostate: Secondary | ICD-10-CM

## 2024-06-22 DIAGNOSIS — Z510 Encounter for antineoplastic radiation therapy: Secondary | ICD-10-CM | POA: Diagnosis not present

## 2024-06-22 LAB — RAD ONC ARIA SESSION SUMMARY
Course Elapsed Days: 44
Plan Fractions Treated to Date: 5
Plan Prescribed Dose Per Fraction: 7 Gy
Plan Total Fractions Prescribed: 5
Plan Total Prescribed Dose: 35 Gy
Reference Point Dosage Given to Date: 35 Gy
Reference Point Session Dosage Given: 7 Gy
Session Number: 10

## 2024-06-22 LAB — CBC (CANCER CENTER ONLY)
HCT: 41.8 % (ref 39.0–52.0)
Hemoglobin: 14.3 g/dL (ref 13.0–17.0)
MCH: 29.8 pg (ref 26.0–34.0)
MCHC: 34.2 g/dL (ref 30.0–36.0)
MCV: 87.1 fL (ref 80.0–100.0)
Platelet Count: 156 10*3/uL (ref 150–400)
RBC: 4.8 MIL/uL (ref 4.22–5.81)
RDW: 12.7 % (ref 11.5–15.5)
WBC Count: 4.8 10*3/uL (ref 4.0–10.5)
nRBC: 0 % (ref 0.0–0.2)

## 2024-06-23 ENCOUNTER — Ambulatory Visit: Admitting: Radiation Oncology

## 2024-06-24 NOTE — Radiation Completion Notes (Signed)
 Patient Name: Curtis Bray, Curtis Bray MRN: [id number] Date of Birth: 03/30/1961 Referring Physician: DUWAINE LOUDER, M.D. Date of Service: 2024-06-24 Radiation Oncologist: Marcey Penton, M.D. [facility] [place]                             RADIATION ONCOLOGY END OF TREATMENT NOTE     Diagnosis: C61 Malignant neoplasm of prostate; R91.8 Other nonspecific abnormal finding of lung field Staging on 2024-04-04: Malignant neoplasm of prostate (HCC) T=cT3a, N=cN1, M=cM0 Intent: Curative     HPI: Patient is a 64 year old male well-known to our apartment.  He is at 5 years from I-125 interstitial implant for Gleason 7 adenocarcinoma the prostate.SABRA  He discontinued ADT therapy with Lupron  in April 24.  His PSA has steadily climbed since 1 year ago now up to 2.14-month prior.  He is asymptomatic specifically denies any increased lower Neri tract symptoms diarrhea or fatigue.  He had a PSMA PET scan showing activity in the right seminal vesicle and multiple right pelvic lymph nodes consistent with recurrent prostate cancer.  No avid skeletal lesions were noted also incidentally noted was a 12 mm left apical nodule with a 7 mm solid component suspicious for primary lung adenocarcinoma.  He did have a dedicated PET scan which did show the ground glass nodule in the left upper lobe has metabolic activity of 2.7 SUV.  He has no evidence of metastatic lymphadenopathy or distant metastatic disease.  We have discussed the case and I am planning SBRT treatment to both his left upper lobe as well as his pelvis with areas of seminal vesicle nodal recurrence.      ==========DELIVERED PLANS==========  First Treatment Date: 2024-05-09 Last Treatment Date: 2024-06-22   Plan Name: Lung_L_SBRT Site: Lung, Left Technique: SBRT/SRT-IMRT Mode: Photon Dose Per Fraction: 11 Gy Prescribed Dose (Delivered / Prescribed): 55 Gy / 55 Gy Prescribed Fxs (Delivered / Prescribed): 5 / 5   Plan Name:  Prostate_SBRT Site: Prostate Technique: SBRT/SRT-IMRT Mode: Photon Dose Per Fraction: 7 Gy Prescribed Dose (Delivered / Prescribed): 35 Gy / 35 Gy Prescribed Fxs (Delivered / Prescribed): 5 / 5     ==========ON TREATMENT VISIT DATES========== 2024-05-09, 2024-05-11, 2024-05-16, 2024-05-16, 2024-05-18, 2024-05-23, 2024-06-07, 2024-06-09, 2024-06-13, 2024-06-15, 2024-06-15, 2024-06-22     ==========UPCOMING VISITS========== 07/27/2024 [facility] RAD ONCOLOGY FOLLOW UP 30 Penton Marcey, MD  07/11/2024 [facility] OFFICE VISIT - LINZIE Malka Domino, MD  07/11/2024 [facility] PFT - PULM [facility] PFT RM        ==========APPENDIX - ON TREATMENT VISIT NOTES==========   See weekly On Treatment Notes in Epic for details in the Media tab (listed as Progress notes on the On Treatment Visit Dates listed above).

## 2024-07-11 ENCOUNTER — Encounter

## 2024-07-11 ENCOUNTER — Ambulatory Visit: Admitting: Pulmonary Disease

## 2024-07-27 ENCOUNTER — Ambulatory Visit: Admitting: Radiation Oncology

## 2024-09-30 ENCOUNTER — Inpatient Hospital Stay

## 2024-10-04 ENCOUNTER — Inpatient Hospital Stay: Admitting: Internal Medicine

## 2024-10-06 ENCOUNTER — Encounter: Admitting: Family Medicine
